# Patient Record
Sex: Male | Born: 1975
Health system: Southern US, Community
[De-identification: ages and names within clinical notes are randomized; demographics above are authoritative.]

## PROBLEM LIST (undated history)

## (undated) DIAGNOSIS — R7303 Prediabetes: Secondary | ICD-10-CM

## (undated) DIAGNOSIS — I10 Essential (primary) hypertension: Secondary | ICD-10-CM

## (undated) DIAGNOSIS — M48062 Spinal stenosis, lumbar region with neurogenic claudication: Principal | ICD-10-CM

## (undated) DIAGNOSIS — J45909 Unspecified asthma, uncomplicated: Secondary | ICD-10-CM

## (undated) DIAGNOSIS — N200 Calculus of kidney: Secondary | ICD-10-CM

## (undated) HISTORY — DX: Unspecified asthma, uncomplicated: J45.909

## (undated) HISTORY — PX: BACK SURGERY: SHX140

## (undated) HISTORY — PX: OTHER SURGICAL HISTORY: SHX169

## (undated) HISTORY — DX: Spinal stenosis, lumbar region with neurogenic claudication: M48.062

---

## 2010-03-05 ENCOUNTER — Emergency Department (HOSPITAL_COMMUNITY): Admission: EM | Admit: 2010-03-05 | Discharge: 2010-03-05 | Payer: Self-pay | Admitting: Family Medicine

## 2010-07-09 LAB — POCT URINALYSIS DIPSTICK
Nitrite: NEGATIVE
Protein, ur: 30 mg/dL — AB
Urobilinogen, UA: 1 mg/dL (ref 0.0–1.0)
pH: 5.5 (ref 5.0–8.0)

## 2010-07-09 LAB — URINE CULTURE

## 2010-07-09 LAB — POCT I-STAT, CHEM 8
BUN: 12 mg/dL (ref 6–23)
Chloride: 101 mEq/L (ref 96–112)
Sodium: 140 mEq/L (ref 135–145)
TCO2: 31 mmol/L (ref 0–100)

## 2010-07-26 ENCOUNTER — Inpatient Hospital Stay (INDEPENDENT_AMBULATORY_CARE_PROVIDER_SITE_OTHER)
Admission: RE | Admit: 2010-07-26 | Discharge: 2010-07-26 | Disposition: A | Discharge status: Home / Self Care | Payer: 59 | Source: Ambulatory Visit | Attending: Family Medicine | Admitting: Family Medicine

## 2010-07-26 DIAGNOSIS — R109 Unspecified abdominal pain: Secondary | ICD-10-CM

## 2010-07-26 LAB — POCT URINALYSIS DIP (DEVICE)
Bilirubin Urine: NEGATIVE
Glucose, UA: NEGATIVE mg/dL
Hgb urine dipstick: NEGATIVE
Nitrite: NEGATIVE
Specific Gravity, Urine: 1.02 (ref 1.005–1.030)

## 2010-10-17 ENCOUNTER — Inpatient Hospital Stay (INDEPENDENT_AMBULATORY_CARE_PROVIDER_SITE_OTHER)
Admission: RE | Admit: 2010-10-17 | Discharge: 2010-10-17 | Disposition: A | Discharge status: Home / Self Care | Payer: 59 | Source: Ambulatory Visit | Attending: Emergency Medicine | Admitting: Emergency Medicine

## 2010-10-17 DIAGNOSIS — H109 Unspecified conjunctivitis: Secondary | ICD-10-CM

## 2010-12-18 ENCOUNTER — Encounter: Payer: Self-pay | Admitting: Family Medicine

## 2010-12-18 ENCOUNTER — Ambulatory Visit (INDEPENDENT_AMBULATORY_CARE_PROVIDER_SITE_OTHER): Payer: 59 | Admitting: Family Medicine

## 2010-12-18 DIAGNOSIS — Z720 Tobacco use: Secondary | ICD-10-CM

## 2010-12-18 DIAGNOSIS — E669 Obesity, unspecified: Secondary | ICD-10-CM

## 2010-12-18 DIAGNOSIS — I10 Essential (primary) hypertension: Secondary | ICD-10-CM

## 2010-12-18 DIAGNOSIS — F17200 Nicotine dependence, unspecified, uncomplicated: Secondary | ICD-10-CM

## 2010-12-18 DIAGNOSIS — A6000 Herpesviral infection of urogenital system, unspecified: Secondary | ICD-10-CM

## 2010-12-18 MED ORDER — LOSARTAN POTASSIUM 50 MG PO TABS: 50.0000 mg | ORAL_TABLET | Freq: Every day | ORAL | Status: DC

## 2010-12-18 MED ORDER — HYDROCHLOROTHIAZIDE 25 MG PO TABS
25.0000 mg | ORAL_TABLET | Freq: Every day | ORAL | Status: DC
Start: 2010-12-18 — End: 2012-01-02

## 2010-12-18 MED ORDER — VALACYCLOVIR HCL 1 G PO TABS: 1000.0000 mg | ORAL_TABLET | Freq: Two times a day (BID) | ORAL | Status: AC

## 2010-12-18 NOTE — Assessment & Plan Note (Signed)
Poorly controlled.  Discussed importance of diet and exercise

## 2010-12-18 NOTE — Assessment & Plan Note (Signed)
Poorly controlled due to no current medications.  Will restart his previous medications and monitor his blood pressure

## 2010-12-18 NOTE — Assessment & Plan Note (Signed)
Smokes 2 per day.  Discussed stopping entirely

## 2010-12-18 NOTE — Patient Instructions (Addendum)
Take your blood pressure tablets every day Measure your blood pressure at work every other day and Write down the readings  Consider stopping smoking completely Increase exercise to 20 minutes 3-5 times a week  Losing weight is a good goal - Aim for 2 lbs a week  You need blood tests and a recheck for your blood pressure in one month   We will refer you to sports Medicine for orthotics

## 2010-12-18 NOTE — Progress Notes (Signed)
  Subjective:    Patient ID: William Franco, male    DOB: August 26, 1975, 35 y.o.   MRN: [id number]  HPI  HYPERTENSION Disease Monitoring Home BP Monitoring not doing Chest pain- no     Dyspnea-  no  Medications Compliance: not currently on medications for this problem. Lightheadedness-  no  Edema-  no  Has had hypertension for years but has been off medications - which I restarted today for a least a month  ROS - See HPI  PMH Cardiovascular risk factors: hypertension Lab Review   Potassium  Date Value Range Status  03/05/2010 3.4* 3.5-5.1 (mEq/L) Final     Sodium  Date Value Range Status  03/05/2010 140  135-145 (mEq/L) Final      OBESITY  Current weight/BMI :  357  How long have been obese:  years Course:  worsening Problems or symptoms it causes:  High blood pressure, pain in feet   Things have tried to improve:  exercise   Review of Systems\ See above      Objective:   Physical Exam Heart - Regular rate and rhythm.  No murmurs, gallops or rubs.    Lungs:  Normal respiratory effort, chest expands symmetrically. Lungs are clear to auscultation, no crackles or wheezes. Abdomen: soft and non-tender without masses, organomegaly or hernias noted.  No guarding or rebound Obese Extremities:  No cyanosis, edema, or deformity noted with good range of motion of all major joints.   Skin:  Intact without suspicious lesions or rashes Neck:  No deformities, thyromegaly, masses, or tenderness noted.   Supple with full range of motion without pain.        Assessment & Plan:

## 2010-12-24 NOTE — Progress Notes (Signed)
Scheduled co visit for 9/24.

## 2011-01-20 ENCOUNTER — Ambulatory Visit: Payer: 59 | Admitting: Home Health Services

## 2011-02-10 ENCOUNTER — Ambulatory Visit: Payer: 59 | Admitting: Home Health Services

## 2011-12-25 ENCOUNTER — Other Ambulatory Visit: Payer: Self-pay | Admitting: Family Medicine

## 2012-01-02 ENCOUNTER — Other Ambulatory Visit: Payer: Self-pay | Admitting: Family Medicine

## 2012-01-08 ENCOUNTER — Other Ambulatory Visit: Payer: Self-pay | Admitting: Family Medicine

## 2012-03-05 ENCOUNTER — Other Ambulatory Visit: Payer: Self-pay | Admitting: Family Medicine

## 2012-03-15 ENCOUNTER — Other Ambulatory Visit: Payer: Self-pay | Admitting: Family Medicine

## 2012-03-15 ENCOUNTER — Telehealth: Payer: Self-pay | Admitting: Family Medicine

## 2012-03-15 MED ORDER — HYDROCHLOROTHIAZIDE 25 MG PO TABS: 25.0000 mg | ORAL_TABLET | Freq: Every day | ORAL | Status: DC

## 2012-03-15 MED ORDER — LOSARTAN POTASSIUM 50 MG PO TABS
50.0000 mg | ORAL_TABLET | Freq: Every day | ORAL | Status: DC
Start: 2012-03-15 — End: 2012-04-19

## 2012-03-15 NOTE — Telephone Encounter (Signed)
Done

## 2012-03-15 NOTE — Telephone Encounter (Signed)
Pt is asking for his HCTZ & Losartan - has made an appt on 11/25 - needs enough until then   [facility]

## 2012-03-22 ENCOUNTER — Encounter: Payer: 59 | Admitting: Family Medicine

## 2012-04-12 ENCOUNTER — Encounter: Payer: 59 | Admitting: Family Medicine

## 2012-04-19 ENCOUNTER — Ambulatory Visit (INDEPENDENT_AMBULATORY_CARE_PROVIDER_SITE_OTHER): Payer: 59 | Admitting: Family Medicine

## 2012-04-19 ENCOUNTER — Encounter: Payer: Self-pay | Admitting: Family Medicine

## 2012-04-19 VITALS — BP 158/108 | HR 79 | Ht >= 80 in | Wt 356.5 lb

## 2012-04-19 DIAGNOSIS — N529 Male erectile dysfunction, unspecified: Secondary | ICD-10-CM | POA: Insufficient documentation

## 2012-04-19 DIAGNOSIS — R350 Frequency of micturition: Secondary | ICD-10-CM

## 2012-04-19 DIAGNOSIS — R358 Other polyuria: Secondary | ICD-10-CM

## 2012-04-19 DIAGNOSIS — I10 Essential (primary) hypertension: Secondary | ICD-10-CM

## 2012-04-19 DIAGNOSIS — R3589 Other polyuria: Secondary | ICD-10-CM

## 2012-04-19 LAB — COMPREHENSIVE METABOLIC PANEL
ALT: 65 U/L — ABNORMAL HIGH (ref 0–53)
Albumin: 4.2 g/dL (ref 3.5–5.2)
Alkaline Phosphatase: 42 U/L (ref 39–117)
Glucose, Bld: 86 mg/dL (ref 70–99)
Potassium: 4 mEq/L (ref 3.5–5.3)
Sodium: 141 mEq/L (ref 135–145)
Total Protein: 7.2 g/dL (ref 6.0–8.3)

## 2012-04-19 MED ORDER — SILDENAFIL CITRATE 100 MG PO TABS: 100.0000 mg | ORAL_TABLET | ORAL | Status: DC | PRN

## 2012-04-19 MED ORDER — HYDROCHLOROTHIAZIDE 25 MG PO TABS: 25.0000 mg | ORAL_TABLET | Freq: Every day | ORAL | Status: DC

## 2012-04-19 MED ORDER — LOSARTAN POTASSIUM 50 MG PO TABS: 50.0000 mg | ORAL_TABLET | Freq: Every day | ORAL | Status: DC

## 2012-04-19 NOTE — Assessment & Plan Note (Signed)
Acute.  No signs of diabetes or UTI.  Maybe related to his eating late at night.  Usually only rises once during night.  Advised to change timing of meals and consider weight loss

## 2012-04-19 NOTE — Progress Notes (Signed)
  Subjective:    Patient ID: William Franco, male    DOB: 04-11-1976, 36 y.o.   MRN: [id number]  HPI  HYPERTENSION Disease Monitoring Home BP Monitoring checks very infrequently last blood pressure was in 150s/90s Chest pain- no    Dyspnea- no Medications Compliance-  did not take today.  MIsses about 1 dose per week Lightheadedness-  no  Edema- no   PMH Lab Review   Potassium  Date Value Range Status  03/05/2010 3.4* 3.5 - 5.1 mEq/L Final     Sodium  Date Value Range Status  03/05/2010 140  135 - 145 mEq/L Final     Creatinine, Ser  Date Value Range Status  03/05/2010 1.3  0.4 - 1.5 mg/dL Final       Polyuria Goes several times a night to urinate for the last few months.  No pain or burning.  Is drinking more liquids.   No Family history  of diabetes   Mass on left thigh Has been present for years.  Does not bother him Is not changing in size.  Just wonders what it is    Review of Systems  ROS - See HPI     Objective:   Physical Exam  Heart - Regular rate and rhythm.  No murmurs, gallops or rubs.    Lungs:  Normal respiratory effort, chest expands symmetrically. Lungs are clear to auscultation, no crackles or wheezes. Extremities:  No cyanosis, edema, or deformity noted with good range of motion of all major joints.   Has an area of varicose veins about 10 x 10 cm on posterior lower medial left thigh.  Nontender soft decreases in size with being supine increase with standing Neck:  No deformities, thyromegaly, masses, or tenderness noted.   Supple with full range of motion without pain. Skin:  Intact without suspicious lesions or rashes         Assessment & Plan:

## 2012-04-19 NOTE — Patient Instructions (Addendum)
Come back in 1 month.  Try to check your blood pressure several times a week and write down the readings and bring in   I will call you if your lab tests are not normal.  Otherwise we will discuss them at your next visit.  Work on stop smoking - consider a substitute   [facility] [address]

## 2012-04-19 NOTE — Assessment & Plan Note (Signed)
Poorly controlled possibly due to not taking medication today but likely will need and increased dose.  Check labs and monitor

## 2012-04-19 NOTE — Assessment & Plan Note (Signed)
Has responded very well in past to viagra.  Will prescribe

## 2012-05-17 ENCOUNTER — Ambulatory Visit: Payer: 59 | Admitting: Family Medicine

## 2012-05-31 ENCOUNTER — Ambulatory Visit: Payer: 59 | Admitting: Family Medicine

## 2012-06-14 ENCOUNTER — Other Ambulatory Visit: Payer: Self-pay | Admitting: *Deleted

## 2012-06-14 DIAGNOSIS — I10 Essential (primary) hypertension: Secondary | ICD-10-CM

## 2012-06-14 MED ORDER — LOSARTAN POTASSIUM 50 MG PO TABS: 50.0000 mg | ORAL_TABLET | Freq: Every day | ORAL | Status: DC

## 2012-06-14 MED ORDER — HYDROCHLOROTHIAZIDE 25 MG PO TABS: 25.0000 mg | ORAL_TABLET | Freq: Every day | ORAL | Status: DC

## 2012-06-19 ENCOUNTER — Other Ambulatory Visit: Payer: Self-pay | Admitting: Family Medicine

## 2012-07-19 ENCOUNTER — Telehealth: Payer: Self-pay | Admitting: Family Medicine

## 2012-07-19 DIAGNOSIS — I10 Essential (primary) hypertension: Secondary | ICD-10-CM

## 2012-07-19 MED ORDER — HYDROCHLOROTHIAZIDE 25 MG PO TABS: 25.0000 mg | ORAL_TABLET | Freq: Every day | ORAL | Status: DC

## 2012-07-19 MED ORDER — LOSARTAN POTASSIUM 50 MG PO TABS: 50.0000 mg | ORAL_TABLET | Freq: Every day | ORAL | Status: DC

## 2012-07-19 NOTE — Telephone Encounter (Signed)
Pt has appt for 4/16 and needs enough Losartin and HCTZ called in until appt  [facility]

## 2012-08-11 ENCOUNTER — Encounter: Payer: Self-pay | Admitting: Family Medicine

## 2012-08-11 ENCOUNTER — Ambulatory Visit (INDEPENDENT_AMBULATORY_CARE_PROVIDER_SITE_OTHER): Payer: 59 | Admitting: Family Medicine

## 2012-08-11 VITALS — BP 158/94 | HR 77 | Ht 79.0 in | Wt 351.0 lb

## 2012-08-11 DIAGNOSIS — F17200 Nicotine dependence, unspecified, uncomplicated: Secondary | ICD-10-CM

## 2012-08-11 DIAGNOSIS — Z720 Tobacco use: Secondary | ICD-10-CM

## 2012-08-11 DIAGNOSIS — Z13220 Encounter for screening for lipoid disorders: Secondary | ICD-10-CM

## 2012-08-11 DIAGNOSIS — I10 Essential (primary) hypertension: Secondary | ICD-10-CM

## 2012-08-11 MED ORDER — HYDROCHLOROTHIAZIDE 25 MG PO TABS: 25.0000 mg | ORAL_TABLET | Freq: Every day | ORAL | Status: DC

## 2012-08-11 MED ORDER — LOSARTAN POTASSIUM 100 MG PO TABS: 100.0000 mg | ORAL_TABLET | Freq: Every day | ORAL | Status: DC

## 2012-08-11 NOTE — Patient Instructions (Addendum)
Increase losartan to 100 mg every day  Come in for a blood test to make sure your kidney and liver are doing well in 1-2 weeks - This is very important Come in fasting no food for 6 hours before hand  Stopping smoking would be the best thing you can do for your health   I will call you if your tests are not good.  Otherwise I will send you a letter.  If you do not hear from me with in 2 weeks please call our office.     Your blood pressure goal is < 140/90.   Check it every week or so.  If regularly > than 140/90 then contact me  Come back in 3-6 months

## 2012-08-11 NOTE — Assessment & Plan Note (Signed)
He feels he can quit completely Encouraged and discussed substitutes

## 2012-08-11 NOTE — Assessment & Plan Note (Signed)
Not at goal.  Will increase Losartan.  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

## 2012-08-11 NOTE — Progress Notes (Signed)
  Subjective:    Patient ID: William Franco, male    DOB: [birth year], 37 y.o.   MRN: [id number]  HPI   HYPERTENSION Disease Monitoring Home BP Monitoring has not been checking Medications Compliance-  Took his medications this morning.  MIsses about 1 dose per week Lightheadedness-  no  Edema- no  Smoking Has not smoked for 1 week.  Usually smokes 2-3 per day.  Feels he can quit without assistance   Review of Systems     Objective:   Physical Exam   Heart - Regular rate and rhythm.  No murmurs, gallops or rubs.    Lungs:  Normal respiratory effort, chest expands symmetrically. Lungs are clear to auscultation, no crackles or wheezes. Extremities:  No cyanosis, edema, or deformity noted with good range of motion of all major joints.        Assessment & Plan:

## 2013-03-30 ENCOUNTER — Other Ambulatory Visit: Payer: Self-pay | Admitting: Family Medicine

## 2013-03-30 DIAGNOSIS — I10 Essential (primary) hypertension: Secondary | ICD-10-CM

## 2013-03-30 MED ORDER — LOSARTAN POTASSIUM 100 MG PO TABS: 100.0000 mg | ORAL_TABLET | Freq: Every day | ORAL | Status: DC

## 2013-07-25 ENCOUNTER — Ambulatory Visit: Payer: 59 | Admitting: Family Medicine

## 2013-08-05 ENCOUNTER — Encounter: Payer: Self-pay | Admitting: Family Medicine

## 2013-08-05 ENCOUNTER — Ambulatory Visit (INDEPENDENT_AMBULATORY_CARE_PROVIDER_SITE_OTHER): Payer: 59 | Admitting: Family Medicine

## 2013-08-05 VITALS — BP 172/106 | HR 81 | Temp 98.3°F | Wt 356.3 lb

## 2013-08-05 DIAGNOSIS — I10 Essential (primary) hypertension: Secondary | ICD-10-CM

## 2013-08-05 LAB — LIPID PANEL
CHOLESTEROL: 168 mg/dL (ref 0–200)
HDL: 34 mg/dL — ABNORMAL LOW (ref >39)
LDL Cholesterol: 118 mg/dL — ABNORMAL HIGH (ref 0–99)
TRIGLYCERIDES: 80 mg/dL (ref <150)
Total CHOL/HDL Ratio: 4.9 Ratio
VLDL: 16 mg/dL (ref 0–40)

## 2013-08-05 LAB — COMPREHENSIVE METABOLIC PANEL
ALBUMIN: 4 g/dL (ref 3.5–5.2)
ALT: 61 U/L — AB (ref 0–53)
AST: 30 U/L (ref 0–37)
Alkaline Phosphatase: 38 U/L — ABNORMAL LOW (ref 39–117)
BUN: 12 mg/dL (ref 6–23)
CALCIUM: 9 mg/dL (ref 8.4–10.5)
CHLORIDE: 105 meq/L (ref 96–112)
CO2: 27 meq/L (ref 19–32)
Creat: 0.95 mg/dL (ref 0.50–1.35)
GLUCOSE: 101 mg/dL — AB (ref 70–99)
Potassium: 4 mEq/L (ref 3.5–5.3)
SODIUM: 141 meq/L (ref 135–145)
TOTAL PROTEIN: 7 g/dL (ref 6.0–8.3)
Total Bilirubin: 0.4 mg/dL (ref 0.2–1.2)

## 2013-08-05 MED ORDER — LOSARTAN POTASSIUM-HCTZ 100-25 MG PO TABS: 1.0000 | ORAL_TABLET | Freq: Every day | ORAL | Status: DC

## 2013-08-05 NOTE — Assessment & Plan Note (Signed)
A: BP not at goal. Not currently on medication. No symptoms or red flags.  P: - Restart medication. Given Losartan and HCTZ in combo pill - Will check BP at work within the next month - Cmet and Lipid panel today - F/u in 6 months or sooner if needed.

## 2013-08-05 NOTE — Patient Instructions (Signed)
We will check your labs today. I will call you with anything abnormal.  Please start taking your medications again. I have prescribed them in a combo pill so you only have to take one pill daily.  Recheck your blood pressure in the PACU in about a month. Let me know if it is still high. We will see you back in 6 months.  Isael Stille M. Berania Peedin, M.D.

## 2013-08-05 NOTE — Progress Notes (Signed)
Patient ID: William Franco, male   DOB: 01-05-1976, 38 y.o.   MRN: [id number]    Subjective: HPI: Patient is a 38 y.o. male presenting to clinic today for follow up on HTN. Concerns today include medication refills  1. Hypertension Blood pressure at home: Does not check Blood pressure today: 172/106 at triage Taking Meds: No, has been out of medications for at least 2 weeks Side effects: None ROS: Denies headache, visual changes, nausea, vomiting, chest pain, abdominal pain or shortness of breath.   History Reviewed: Daily smoker - Thinking about quitting Health Maintenance: UTD  ROS: Please see HPI above.  Objective: Office vital signs reviewed. BP 172/106  Pulse 81  Temp(Src) 98.3 F (36.8 C) (Oral)  Wt 356 lb 4.8 oz (161.617 kg)  Physical Examination:  General: Awake, alert. NAD.  HEENT: Atraumatic, normocephalic. MMM Pulm: CTAB, no wheezes Cardio: RRR, no murmurs appreciated Abdomen:+BS, soft, nontender, nondistended Extremities: No edema Neuro: Strength and sensation grossly intact  Assessment: 38 y.o. male follow up HTN  Plan: See Problem List and After Visit Summary

## 2013-08-15 ENCOUNTER — Encounter: Payer: Self-pay | Admitting: Family Medicine

## 2013-09-30 ENCOUNTER — Emergency Department (HOSPITAL_COMMUNITY)
Admission: EM | Admit: 2013-09-30 | Discharge: 2013-09-30 | Disposition: A | Discharge status: Home / Self Care | Payer: 59 | Source: Home / Self Care

## 2013-09-30 ENCOUNTER — Encounter (HOSPITAL_COMMUNITY): Payer: Self-pay | Admitting: Emergency Medicine

## 2013-09-30 DIAGNOSIS — J029 Acute pharyngitis, unspecified: Secondary | ICD-10-CM

## 2013-09-30 LAB — POCT RAPID STREP A: Streptococcus, Group A Screen (Direct): NEGATIVE

## 2013-09-30 MED ORDER — AMOXICILLIN 500 MG PO CAPS: 1000.0000 mg | ORAL_CAPSULE | Freq: Two times a day (BID) | ORAL | Status: DC

## 2013-09-30 NOTE — ED Provider Notes (Signed)
CSN: [id number]     Arrival date & time 09/30/13  0813 History   First MD Initiated Contact with Patient 09/30/13 [phone]     No chief complaint on file.  (Consider location/radiation/quality/duration/timing/severity/associated sxs/prior Treatment) HPI Comments: Sore throat  X 4 d. Minor PND and no fever. Works in [place].   No past medical history on file. Past Surgical History  Procedure Laterality Date  . None     Family History  Problem Relation Age of Onset  . Hypertension Father   . Hypertension Mother    History  Substance Use Topics  . Smoking status: Current Every Day Smoker -- 0.10 packs/day  . Smokeless tobacco: Not on file  . Alcohol Use: No    Review of Systems  Constitutional: Positive for activity change and fatigue. Negative for fever.  HENT: Positive for postnasal drip and sore throat. Negative for congestion and rhinorrhea.   Eyes: Negative.   Respiratory: Negative.   Cardiovascular: Negative.   Gastrointestinal: Negative.     Allergies  Review of patient's allergies indicates no known allergies.  Home Medications   Prior to Admission medications   Medication Sig Start Date End Date Taking? Authorizing Provider  amoxicillin (AMOXIL) 500 MG capsule Take 2 capsules (1,000 mg total) by mouth 2 (two) times daily. 09/30/13   Janne Napoleon, NP  losartan-hydrochlorothiazide (HYZAAR) 100-25 MG per tablet Take 1 tablet by mouth daily. 08/05/13   Amber Fidel Levy, MD  sildenafil (VIAGRA) 100 MG tablet Take 1 tablet (100 mg total) by mouth as needed for erectile dysfunction. 04/19/12 04/19/13  Lind Covert, MD   BP 161/99  Pulse 70  Temp(Src) 98.4 F (36.9 C) (Oral)  Resp 18  SpO2 99% Physical Exam  Nursing note and vitals reviewed. Constitutional: He is oriented to person, place, and time. He appears well-developed and well-nourished. No distress.  HENT:  Right Ear: External ear normal.  Left Ear: External ear normal.  Beefy red OP and tonsils with  exudates. No edema or signs of abscess.  Eyes: Conjunctivae and EOM are normal.  Neck: Normal range of motion. Neck supple.  Cardiovascular: Normal rate.   Pulmonary/Chest: Effort normal.  Faint end expiratory wheeze  Lymphadenopathy:    He has no cervical adenopathy.  Neurological: He is alert and oriented to person, place, and time. He exhibits normal muscle tone.  Skin: Skin is warm and dry.  Psychiatric: He has a normal mood and affect.    ED Course  Procedures (including critical care time) Labs Review Labs Reviewed  POCT RAPID STREP A ([facility] URG CARE ONLY)    Imaging Review No results found.   MDM   1. Exudative pharyngitis     Amoxicillin as dir Cepacol loz, ibuprofen 800 mg  q 8h prn No work today. Lots of fluids     Janne Napoleon, NP 09/30/13 0900

## 2013-09-30 NOTE — Discharge Instructions (Signed)
Pharyngitis Pharyngitis is redness, pain, and swelling (inflammation) of your pharynx.  CAUSES  Pharyngitis is usually caused by infection. Most of the time, these infections are from viruses (viral) and are part of a cold. However, sometimes pharyngitis is caused by bacteria (bacterial). Pharyngitis can also be caused by allergies. Viral pharyngitis may be spread from person to person by coughing, sneezing, and personal items or utensils (cups, forks, spoons, toothbrushes). Bacterial pharyngitis may be spread from person to person by more intimate contact, such as kissing.  SIGNS AND SYMPTOMS  Symptoms of pharyngitis include:   Sore throat.   Tiredness (fatigue).   Low-grade fever.   Headache.  Joint pain and muscle aches.  Skin rashes.  Swollen lymph nodes.  Plaque-like film on throat or tonsils (often seen with bacterial pharyngitis). DIAGNOSIS  Your health care provider will ask you questions about your illness and your symptoms. Your medical history, along with a physical exam, is often all that is needed to diagnose pharyngitis. Sometimes, a rapid strep test is done. Other lab tests may also be done, depending on the suspected cause.  TREATMENT  Viral pharyngitis will usually get better in 3 4 days without the use of medicine. Bacterial pharyngitis is treated with medicines that kill germs (antibiotics).  HOME CARE INSTRUCTIONS   Drink enough water and fluids to keep your urine clear or pale yellow.   Only take over-the-counter or prescription medicines as directed by your health care provider:   If you are prescribed antibiotics, make sure you finish them even if you start to feel better.   Do not take aspirin.   Get lots of rest.   Gargle with 8 oz of salt water ( tsp of salt per 1 qt of water) as often as every 1 2 hours to soothe your throat.   Throat lozenges (if you are not at risk for choking) or sprays may be used to soothe your throat. SEEK MEDICAL  CARE IF:   You have large, tender lumps in your neck.  You have a rash.  You cough up green, yellow-brown, or bloody spit. SEEK IMMEDIATE MEDICAL CARE IF:   Your neck becomes stiff.  You drool or are unable to swallow liquids.  You vomit or are unable to keep medicines or liquids down.  You have severe pain that does not go away with the use of recommended medicines.  You have trouble breathing (not caused by a stuffy nose). MAKE SURE YOU:   Understand these instructions.  Will watch your condition.  Will get help right away if you are not doing well or get worse. Document Released: 04/14/2005 Document Revised: 02/02/2013 Document Reviewed: 12/20/2012 [facility] Patient Information 2014 [place].  Sore Throat A sore throat is pain, burning, irritation, or scratchiness of the throat. There is often pain or tenderness when swallowing or talking. A sore throat may be accompanied by other symptoms, such as coughing, sneezing, fever, and swollen neck glands. A sore throat is often the first sign of another sickness, such as a cold, flu, strep throat, or mononucleosis (commonly known as mono). Most sore throats go away without medical treatment. CAUSES  The most common causes of a sore throat include:  A viral infection, such as a cold, flu, or mono.  A bacterial infection, such as strep throat, tonsillitis, or whooping cough.  Seasonal allergies.  Dryness in the air.  Irritants, such as smoke or pollution.  Gastroesophageal reflux disease (GERD). HOME CARE INSTRUCTIONS   Only take over-the-counter  medicines as directed by your caregiver.  Drink enough fluids to keep your urine clear or pale yellow.  Rest as needed.  Try using throat sprays, lozenges, or sucking on hard candy to ease any pain (if older than 4 years or as directed).  Sip warm liquids, such as broth, herbal tea, or warm water with honey to relieve pain temporarily. You may also eat or drink cold or  frozen liquids such as frozen ice pops.  Gargle with salt water (mix 1 tsp salt with 8 oz of water).  Do not smoke and avoid secondhand smoke.  Put a cool-mist humidifier in your bedroom at night to moisten the air. You can also turn on a hot shower and sit in the bathroom with the door closed for 5 10 minutes. SEEK IMMEDIATE MEDICAL CARE IF:  You have difficulty breathing.  You are unable to swallow fluids, soft foods, or your saliva.  You have increased swelling in the throat.  Your sore throat does not get better in 7 days.  You have nausea and vomiting.  You have a fever or persistent symptoms for more than 2 3 days.  You have a fever and your symptoms suddenly get worse. MAKE SURE YOU:   Understand these instructions.  Will watch your condition.  Will get help right away if you are not doing well or get worse. Document Released: 05/22/2004 Document Revised: 03/31/2012 Document Reviewed: 12/21/2011 [facility] Patient Information 2014 [place], [place].

## 2013-09-30 NOTE — ED Provider Notes (Signed)
Medical screening examination/treatment/procedure(s) were performed by resident physician or non-physician practitioner and as supervising physician I was immediately available for consultation/collaboration.   Pauline Good MD.   Billy Fischer, MD 09/30/13 1150

## 2013-09-30 NOTE — ED Notes (Signed)
C/o sore throat since Monday ( 6/1)

## 2013-10-02 LAB — CULTURE, GROUP A STREP

## 2013-11-24 ENCOUNTER — Other Ambulatory Visit: Payer: Self-pay | Admitting: Family Medicine

## 2013-11-28 ENCOUNTER — Other Ambulatory Visit: Payer: Self-pay | Admitting: *Deleted

## 2013-11-28 MED ORDER — SILDENAFIL CITRATE 100 MG PO TABS: 100.0000 mg | ORAL_TABLET | ORAL | Status: DC | PRN

## 2014-03-07 ENCOUNTER — Other Ambulatory Visit: Payer: Self-pay | Admitting: *Deleted

## 2014-03-07 MED ORDER — LOSARTAN POTASSIUM 100 MG PO TABS: 100.0000 mg | ORAL_TABLET | Freq: Every day | ORAL | Status: DC

## 2014-03-27 ENCOUNTER — Encounter: Payer: Self-pay | Admitting: Family Medicine

## 2014-03-27 ENCOUNTER — Ambulatory Visit (INDEPENDENT_AMBULATORY_CARE_PROVIDER_SITE_OTHER): Payer: 59 | Admitting: Family Medicine

## 2014-03-27 VITALS — BP 165/99 | HR 82 | Temp 98.5°F | Ht >= 80 in | Wt 364.0 lb

## 2014-03-27 DIAGNOSIS — E669 Obesity, unspecified: Secondary | ICD-10-CM

## 2014-03-27 DIAGNOSIS — Z720 Tobacco use: Secondary | ICD-10-CM

## 2014-03-27 DIAGNOSIS — I10 Essential (primary) hypertension: Secondary | ICD-10-CM

## 2014-03-27 MED ORDER — HYDROCHLOROTHIAZIDE 25 MG PO TABS: 25.0000 mg | ORAL_TABLET | Freq: Every day | ORAL | Status: DC

## 2014-03-27 MED ORDER — LOSARTAN POTASSIUM 100 MG PO TABS: 100.0000 mg | ORAL_TABLET | Freq: Every day | ORAL | Status: DC

## 2014-03-27 MED ORDER — VALACYCLOVIR HCL 500 MG PO TABS: 500.0000 mg | ORAL_TABLET | Freq: Two times a day (BID) | ORAL | Status: DC

## 2014-03-27 NOTE — Assessment & Plan Note (Signed)
Recommended stopping

## 2014-03-27 NOTE — Assessment & Plan Note (Signed)
Not controlled likely due to being out of medication Suggested monitoring in PACU near where he works - he again says he will Went over goals of < 140/90

## 2014-03-27 NOTE — Progress Notes (Signed)
   Subjective:    Patient ID: William Franco, male    DOB: 07/31/1975, 38 y.o.   MRN: [id number]  HPI  HYPERTENSION Disease Monitoring Home BP Monitoring not checking at work Chest pain- no    Dyspnea- no Medications Compliance-  Out of losartan for 3-4 days. Lightheadedness-  no  Edema- no  Smoking Still 1 cig per day when off work  Obesity Not exercising and does drink sweet drinks. 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   Vital Signs reviewed  ROS - See HPI  PMH Lab Review   POTASSIUM  Date Value Ref Range Status  08/05/2013 4.0 3.5 - 5.3 mEq/L Final   SODIUM  Date Value Ref Range Status  08/05/2013 141 135 - 145 mEq/L Final   CREAT  Date Value Ref Range Status  08/05/2013 0.95 0.50 - 1.35 mg/dL Final   CREATININE, SER  Date Value Ref Range Status  03/05/2010 1.3 0.4 - 1.5 mg/dL Final           Review of Systems     Objective:   Physical Exam Alert no acute distress Heart - Regular rate and rhythm.  No murmurs, gallops or rubs.    Lungs:  Normal respiratory effort, chest expands symmetrically. Lungs are clear to auscultation, no crackles or wheezes. Extremities:  No cyanosis, edema, or deformity noted with good range of motion of all major joints.          Assessment & Plan:

## 2014-03-27 NOTE — Assessment & Plan Note (Signed)
Worsened.  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  Voices good understanding of causes and solutions.  Offered counseling if desired

## 2014-03-27 NOTE — Patient Instructions (Signed)
Good to see you today!  Thanks for coming in.  Listen to yourself  Exercise 3 x a week  Cut down or out all sugary drinks and smaller portions.  Aim to lose about 2 lbs a week   Ask them to take your blood pressure several times after you are back on your medications for a few days.  It should be less than 140/90 If regularly above this then call me  Come back in April for a check up

## 2014-05-16 ENCOUNTER — Encounter (HOSPITAL_COMMUNITY): Payer: Self-pay | Admitting: Emergency Medicine

## 2014-05-16 ENCOUNTER — Emergency Department (HOSPITAL_COMMUNITY)
Admission: EM | Admit: 2014-05-16 | Discharge: 2014-05-16 | Disposition: A | Discharge status: Home / Self Care | Payer: 59 | Attending: Emergency Medicine | Admitting: Emergency Medicine

## 2014-05-16 ENCOUNTER — Emergency Department (HOSPITAL_COMMUNITY): Payer: 59

## 2014-05-16 DIAGNOSIS — Z79899 Other long term (current) drug therapy: Secondary | ICD-10-CM | POA: Insufficient documentation

## 2014-05-16 DIAGNOSIS — Z87442 Personal history of urinary calculi: Secondary | ICD-10-CM | POA: Diagnosis not present

## 2014-05-16 DIAGNOSIS — I10 Essential (primary) hypertension: Secondary | ICD-10-CM | POA: Insufficient documentation

## 2014-05-16 DIAGNOSIS — N3090 Cystitis, unspecified without hematuria: Secondary | ICD-10-CM

## 2014-05-16 DIAGNOSIS — R319 Hematuria, unspecified: Secondary | ICD-10-CM

## 2014-05-16 DIAGNOSIS — Z720 Tobacco use: Secondary | ICD-10-CM | POA: Diagnosis not present

## 2014-05-16 DIAGNOSIS — N3091 Cystitis, unspecified with hematuria: Secondary | ICD-10-CM | POA: Diagnosis not present

## 2014-05-16 HISTORY — DX: Calculus of kidney: N20.0

## 2014-05-16 HISTORY — DX: Essential (primary) hypertension: I10

## 2014-05-16 LAB — URINALYSIS, ROUTINE W REFLEX MICROSCOPIC
BILIRUBIN URINE: NEGATIVE
Glucose, UA: NEGATIVE mg/dL
Ketones, ur: NEGATIVE mg/dL
LEUKOCYTES UA: NEGATIVE
Nitrite: NEGATIVE
Protein, ur: NEGATIVE mg/dL
SPECIFIC GRAVITY, URINE: 1.025 (ref 1.005–1.030)
UROBILINOGEN UA: 1 mg/dL (ref 0.0–1.0)
pH: 6 (ref 5.0–8.0)

## 2014-05-16 LAB — I-STAT CHEM 8, ED
BUN: 19 mg/dL (ref 6–23)
Calcium, Ion: 1.16 mmol/L (ref 1.12–1.23)
Chloride: 100 mEq/L (ref 96–112)
Creatinine, Ser: 1.2 mg/dL (ref 0.50–1.35)
Glucose, Bld: 112 mg/dL — ABNORMAL HIGH (ref 70–99)
HEMATOCRIT: 45 % (ref 39.0–52.0)
Hemoglobin: 15.3 g/dL (ref 13.0–17.0)
POTASSIUM: 3.5 mmol/L (ref 3.5–5.1)
Sodium: 141 mmol/L (ref 135–145)
TCO2: 27 mmol/L (ref 0–100)

## 2014-05-16 LAB — URINE MICROSCOPIC-ADD ON

## 2014-05-16 MED ORDER — SULFAMETHOXAZOLE-TRIMETHOPRIM 800-160 MG PO TABS: 1.0000 | ORAL_TABLET | Freq: Two times a day (BID) | ORAL | Status: AC

## 2014-05-16 MED ORDER — PHENAZOPYRIDINE HCL 95 MG PO TABS: 95.0000 mg | ORAL_TABLET | Freq: Three times a day (TID) | ORAL | Status: DC | PRN

## 2014-05-16 NOTE — ED Provider Notes (Signed)
CSN: [id number]     Arrival date & time 05/16/14  0636 History   First MD Initiated Contact with Patient 05/16/14 [id number]     Chief Complaint  Patient presents with  . Hematuria     (Consider location/radiation/quality/duration/timing/severity/associated sxs/prior Treatment) HPI Comments: 39 y/o male with a PMHx of HTN and kidney stones presenting with hematuria x 1 day. Pt reports he urinated around 12:00 AM today and experienced a slight burning sensation in his urine stream, and 5 hours later urinated again and had a sharp, severe pain through his urethra with bright red blood in his urine stream. Currently has no urethral burning, however states he has increased urinary frequency, moreso than normal from HCTZ. Admits to hx of kidney stones, however states this pain is more intense. No alleviating factors. States he's had the same male sexual partner for a while and "doesn't need to worry about that". Has a hx of chronic back pain and has not noticed any pain out of his normal. No abdominal pain, n/v, fevers, penile pain/discharge/swelling, testicular pain or swelling.  Patient is a 39 y.o. male presenting with hematuria. The history is provided by the patient.  Hematuria    Past Medical History  Diagnosis Date  . Hypertension   . Kidney stones    Past Surgical History  Procedure Laterality Date  . None     Family History  Problem Relation Age of Onset  . Hypertension Father   . Hypertension Mother    History  Substance Use Topics  . Smoking status: Current Every Day Smoker -- 0.10 packs/day  . Smokeless tobacco: Current User  . Alcohol Use: Yes     Comment: ocassionaly    Review of Systems  Genitourinary: Positive for dysuria, frequency and hematuria.  All other systems reviewed and are negative.     Allergies  Review of patient's allergies indicates no known allergies.  Home Medications   Prior to Admission medications   Medication Sig Start Date End Date  Taking? Authorizing Provider  hydrochlorothiazide (HYDRODIURIL) 25 MG tablet Take 1 tablet (25 mg total) by mouth daily. 03/27/14  Yes Lind Covert, MD  losartan (COZAAR) 100 MG tablet Take 1 tablet (100 mg total) by mouth daily. 03/27/14  Yes Lind Covert, MD  sildenafil (VIAGRA) 100 MG tablet Take 1 tablet (100 mg total) by mouth as needed for erectile dysfunction. 11/28/13 11/28/14 Yes Lind Covert, MD  valACYclovir (VALTREX) 500 MG tablet Take 1 tablet (500 mg total) by mouth 2 (two) times daily. 03/27/14  Yes Lind Covert, MD  phenazopyridine (PYRIDIUM) 95 MG tablet Take 1 tablet (95 mg total) by mouth 3 (three) times daily as needed for pain. 05/16/14   Darchelle Nunes M Amedee Cerrone, PA-C  sulfamethoxazole-trimethoprim (BACTRIM DS,SEPTRA DS) 800-160 MG per tablet Take 1 tablet by mouth 2 (two) times daily. 05/16/14 05/23/14  Alexsis Branscom M Timea Breed, PA-C   BP 129/87 mmHg  Pulse 66  Temp(Src) 97.5 F (36.4 C) (Oral)  Resp 11  Ht 6\' 7"  (2.007 m)  Wt 360 lb (163.295 kg)  BMI 40.54 kg/m2  SpO2 99% Physical Exam  Constitutional: He is oriented to person, place, and time. He appears well-developed and well-nourished. No distress.  HENT:  Head: Normocephalic and atraumatic.  Mouth/Throat: Oropharynx is clear and moist.  Eyes: Conjunctivae are normal.  Neck: Normal range of motion. Neck supple.  Cardiovascular: Normal rate, regular rhythm and normal heart sounds.   Pulmonary/Chest: Effort normal and breath sounds normal.  Abdominal:  Soft. Bowel sounds are normal. There is no tenderness.  No CVAT.  Genitourinary: Right testis shows no swelling and no tenderness. Left testis shows no swelling and no tenderness. Uncircumcised.  Small amount of blood around urethral meatus. No penile discharge, erythema or swelling.  Musculoskeletal: Normal range of motion. He exhibits no edema.  Neurological: He is alert and oriented to person, place, and time.  Skin: Skin is warm and dry. He is not  diaphoretic.  Psychiatric: He has a normal mood and affect. His behavior is normal.  Nursing note and vitals reviewed.   ED Course  Procedures (including critical care time) Labs Review Labs Reviewed  URINALYSIS, ROUTINE W REFLEX MICROSCOPIC - Abnormal; Notable for the following:    Hgb urine dipstick LARGE (*)    All other components within normal limits  I-STAT CHEM 8, ED - Abnormal; Notable for the following:    Glucose, Bld 112 (*)    All other components within normal limits  URINE CULTURE  URINE MICROSCOPIC-ADD ON  GC/CHLAMYDIA PROBE AMP ([place])    Imaging Review Ct Abdomen Pelvis Wo Contrast  05/16/2014   CLINICAL DATA:  Hematuria and dysuria  EXAM: CT ABDOMEN AND PELVIS WITHOUT CONTRAST  TECHNIQUE: Multidetector CT imaging of the abdomen and pelvis was performed following the standard protocol without oral or intravenous contrast material administration.  COMPARISON:  August 14, 2010  FINDINGS: Lung bases are clear.  Liver is prominent, measuring 20.7 cm in length. No focal liver lesions are identified on this noncontrast enhanced study. Gallbladder wall is not thickened. There is no biliary duct dilatation.  Spleen, pancreas, and adrenals appear normal.  There is a cyst in the mid left kidney measuring 1.2 x 1.2 cm. There is no demonstrable intrarenal calculus or hydronephrosis on either side. There is no ureteral calculus on either side. There are several phleboliths in the pelvis which are near but felt to be separate from the left ureter distally.  In the pelvis, the urinary bladder wall is somewhat thickened. There is no pelvic mass or pelvic fluid collection. Appendix appears normal.  There is a minimal ventral hernia containing only fat.  There is no bowel obstruction.  No free air or portal venous air.  There is no ascites, adenopathy, or abscess in the abdomen or pelvis. There is no demonstrable abdominal aortic aneurysm. There is spinal stenosis at L3-4 and L4-5 due to  congenital narrowing as well as diffuse disc protrusion and bony hypertrophy. Borderline stenosis is noted at L2-3 due to similar factors. There is degenerative change in the lumbar spine. There are no blastic or lytic bone lesions.  IMPRESSION: Urinary bladder wall thickening.  Suspect a degree of cystitis.  Spinal stenosis at several levels, multifactorial.  No renal or ureteral calculus.  No hydronephrosis.  Prominent liver without focal lesion.  No bowel obstruction.  No abscess.  Appendix appears normal.   Electronically Signed   By: Lowella Grip M.D.   On: 05/16/2014 08:30     EKG Interpretation None      MDM   Final diagnoses:  Hematuria  Cystitis   Pt in NAD. VSS. Afebrile. Abdomen soft and non-tender. No CVAT. Other than blood around urethral meatus, GU exam normal. Initial concern for stone. UA positive for TNTC RBC. CT scan negative for stone. Urinary bladder wall thickening suspecting cystitis. UA rare bacteria, 3-6 WBC. Given sxs, will treat with abx. Urine culture and urine gc/chlamydia pending. F/u with PCP. Stable for d/c. Return precautions  given. Patient states understanding of treatment care plan and is agreeable.  Carman Ching, PA-C 05/16/14 1173  Pamella Pert, MD 05/16/14 2255

## 2014-05-16 NOTE — Discharge Instructions (Signed)
Take antibiotic Bactrim twice daily for 1 week. Take pyridium as directed for burning with urination.  Hematuria Hematuria is blood in your urine. It can be caused by a bladder infection, kidney infection, prostate infection, kidney stone, or cancer of your urinary tract. Infections can usually be treated with medicine, and a kidney stone usually will pass through your urine. If neither of these is the cause of your hematuria, further workup to find out the reason may be needed. It is very important that you tell your health care provider about any blood you see in your urine, even if the blood stops without treatment or happens without causing pain. Blood in your urine that happens and then stops and then happens again can be a symptom of a very serious condition. Also, pain is not a symptom in the initial stages of many urinary cancers. HOME CARE INSTRUCTIONS   Drink lots of fluid, 3-4 quarts a day. If you have been diagnosed with an infection, cranberry juice is especially recommended, in addition to large amounts of water.  Avoid caffeine, tea, and carbonated beverages because they tend to irritate the bladder.  Avoid alcohol because it may irritate the prostate.  Take all medicines as directed by your health care provider.  If you were prescribed an antibiotic medicine, finish it all even if you start to feel better.  If you have been diagnosed with a kidney stone, follow your health care provider's instructions regarding straining your urine to catch the stone.  Empty your bladder often. Avoid holding urine for long periods of time.  After a bowel movement, women should cleanse front to back. Use each tissue only once.  Empty your bladder before and after sexual intercourse if you are a male. SEEK MEDICAL CARE IF:  You develop back pain.  You have a fever.  You have a feeling of sickness in your stomach (nausea) or vomiting.  Your symptoms are not better in 3 days. Return  sooner if you are getting worse. SEEK IMMEDIATE MEDICAL CARE IF:   You develop severe vomiting and are unable to keep the medicine down.  You develop severe back or abdominal pain despite taking your medicines.  You begin passing a large amount of blood or clots in your urine.  You feel extremely weak or faint, or you pass out. MAKE SURE YOU:   Understand these instructions.  Will watch your condition.  Will get help right away if you are not doing well or get worse. Document Released: 04/14/2005 Document Revised: 08/29/2013 Document Reviewed: 12/13/2012 [facility] Patient Information 2015 [place], [place]. This information is not intended to replace advice given to you by your health care provider. Make sure you discuss any questions you have with your health care provider.  Urinary Tract Infection Urinary tract infections (UTIs) can develop anywhere along your urinary tract. Your urinary tract is your body's drainage system for removing wastes and extra water. Your urinary tract includes two kidneys, two ureters, a bladder, and a urethra. Your kidneys are a pair of bean-shaped organs. Each kidney is about the size of your fist. They are located below your ribs, one on each side of your spine. CAUSES Infections are caused by microbes, which are microscopic organisms, including fungi, viruses, and bacteria. These organisms are so small that they can only be seen through a microscope. Bacteria are the microbes that most commonly cause UTIs. SYMPTOMS  Symptoms of UTIs may vary by age and gender of the patient and by the location  of the infection. Symptoms in young women typically include a frequent and intense urge to urinate and a painful, burning feeling in the bladder or urethra during urination. Older women and men are more likely to be tired, shaky, and weak and have muscle aches and abdominal pain. A fever may mean the infection is in your kidneys. Other symptoms of a kidney infection  include pain in your back or sides below the ribs, nausea, and vomiting. DIAGNOSIS To diagnose a UTI, your caregiver will ask you about your symptoms. Your caregiver also will ask to provide a urine sample. The urine sample will be tested for bacteria and white blood cells. White blood cells are made by your body to help fight infection. TREATMENT  Typically, UTIs can be treated with medication. Because most UTIs are caused by a bacterial infection, they usually can be treated with the use of antibiotics. The choice of antibiotic and length of treatment depend on your symptoms and the type of bacteria causing your infection. HOME CARE INSTRUCTIONS  If you were prescribed antibiotics, take them exactly as your caregiver instructs you. Finish the medication even if you feel better after you have only taken some of the medication.  Drink enough water and fluids to keep your urine clear or pale yellow.  Avoid caffeine, tea, and carbonated beverages. They tend to irritate your bladder.  Empty your bladder often. Avoid holding urine for long periods of time.  Empty your bladder before and after sexual intercourse.  After a bowel movement, women should cleanse from front to back. Use each tissue only once. SEEK MEDICAL CARE IF:   You have back pain.  You develop a fever.  Your symptoms do not begin to resolve within 3 days. SEEK IMMEDIATE MEDICAL CARE IF:   You have severe back pain or lower abdominal pain.  You develop chills.  You have nausea or vomiting.  You have continued burning or discomfort with urination. MAKE SURE YOU:   Understand these instructions.  Will watch your condition.  Will get help right away if you are not doing well or get worse. Document Released: 01/22/2005 Document Revised: 10/14/2011 Document Reviewed: 05/23/2011 [facility] Patient Information 2015 [place], [place]. This information is not intended to replace advice given to you by your health care  provider. Make sure you discuss any questions you have with your health care provider.

## 2014-05-16 NOTE — ED Notes (Signed)
Pt states he woke up today at 5 am with a sharp pain and burning sensation when he urinated and he had bright red blood coming. Pt denies any pain or discomfort now.

## 2014-05-17 LAB — URINE CULTURE
Colony Count: NO GROWTH
Culture: NO GROWTH

## 2014-05-17 LAB — GC/CHLAMYDIA PROBE AMP (~~LOC~~) NOT AT ARMC
Chlamydia: NEGATIVE
NEISSERIA GONORRHEA: NEGATIVE

## 2014-06-29 ENCOUNTER — Encounter (HOSPITAL_COMMUNITY): Payer: Self-pay | Admitting: *Deleted

## 2014-06-29 ENCOUNTER — Emergency Department (HOSPITAL_COMMUNITY)
Admission: EM | Admit: 2014-06-29 | Discharge: 2014-06-29 | Disposition: A | Discharge status: Home / Self Care | Payer: 59 | Attending: Emergency Medicine | Admitting: Emergency Medicine

## 2014-06-29 DIAGNOSIS — M545 Low back pain, unspecified: Secondary | ICD-10-CM

## 2014-06-29 DIAGNOSIS — Z79899 Other long term (current) drug therapy: Secondary | ICD-10-CM | POA: Insufficient documentation

## 2014-06-29 DIAGNOSIS — R109 Unspecified abdominal pain: Secondary | ICD-10-CM | POA: Diagnosis present

## 2014-06-29 DIAGNOSIS — Z720 Tobacco use: Secondary | ICD-10-CM | POA: Insufficient documentation

## 2014-06-29 DIAGNOSIS — Z87442 Personal history of urinary calculi: Secondary | ICD-10-CM | POA: Insufficient documentation

## 2014-06-29 DIAGNOSIS — Z87440 Personal history of urinary (tract) infections: Secondary | ICD-10-CM | POA: Diagnosis not present

## 2014-06-29 DIAGNOSIS — I10 Essential (primary) hypertension: Secondary | ICD-10-CM | POA: Insufficient documentation

## 2014-06-29 LAB — URINALYSIS, ROUTINE W REFLEX MICROSCOPIC
Bilirubin Urine: NEGATIVE
GLUCOSE, UA: NEGATIVE mg/dL
Hgb urine dipstick: NEGATIVE
Ketones, ur: 15 mg/dL — AB
LEUKOCYTES UA: NEGATIVE
Nitrite: NEGATIVE
PH: 6 (ref 5.0–8.0)
Protein, ur: NEGATIVE mg/dL
SPECIFIC GRAVITY, URINE: 1.034 — AB (ref 1.005–1.030)
Urobilinogen, UA: 1 mg/dL (ref 0.0–1.0)

## 2014-06-29 MED ORDER — NAPROXEN 500 MG PO TABS: 500.0000 mg | ORAL_TABLET | Freq: Two times a day (BID) | ORAL | Status: DC

## 2014-06-29 MED ORDER — HYDROCODONE-ACETAMINOPHEN 5-325 MG PO TABS: 1.0000 | ORAL_TABLET | Freq: Four times a day (QID) | ORAL | Status: DC | PRN

## 2014-06-29 MED ORDER — LIDOCAINE 5 % EX PTCH
1.0000 | MEDICATED_PATCH | CUTANEOUS | Status: DC
Start: 2014-06-29 — End: 2014-10-12

## 2014-06-29 NOTE — ED Provider Notes (Signed)
CSN: [id number]     Arrival date & time 06/29/14  1646 History  This chart was scribed for non-physician practitioner, Margarita Mail PA,  working with Blanchie Dessert, MD, by Eustaquio Maize, ED Scribe. This patient was seen in room TR06C/TR06C and the patient's care was started at 5:56 PM.    Chief Complaint  Patient presents with  . Urinary Tract Infection   The history is provided by the patient. No language interpreter was used.     HPI Comments: William Franco is a 39 y.o. male who presents to the Emergency Department complaining of urinary tract infection symptoms that began approximately 4 days ago. He mainly complains of left sided flank pain. He also mentions a "gassy" feeling in his abdomen. He claims that the pain is exacerbated with twisting and bending. Pt was seen in ED 1 month ago and reports he was diagnosed with a urinary tract infection. He states that these symptoms feel similar to previous symptoms. He denies dysuria, hematuria, fever, chills, or any other symptoms. Pt had follow up appointment with primary care physician on Tuesday, 07/04/2014, but did not think he could wait that long.   05/16/2014 Urinalysis - Hemoglobin. 2-6 WBCs. No growth with urine culture.    Past Medical History  Diagnosis Date  . Hypertension   . Kidney stones    Past Surgical History  Procedure Laterality Date  . None     Family History  Problem Relation Age of Onset  . Hypertension Father   . Hypertension Mother    History  Substance Use Topics  . Smoking status: Current Every Day Smoker -- 0.10 packs/day  . Smokeless tobacco: Current User  . Alcohol Use: Yes     Comment: ocassionaly    Review of Systems  Constitutional: Negative for fever and chills.  Gastrointestinal:       Gaseous.   Genitourinary: Positive for flank pain. Negative for dysuria and hematuria.      Allergies  Review of patient's allergies indicates no known allergies.  Home Medications   Prior to  Admission medications   Medication Sig Start Date End Date Taking? Authorizing Provider  hydrochlorothiazide (HYDRODIURIL) 25 MG tablet Take 1 tablet (25 mg total) by mouth daily. 03/27/14   Lind Covert, MD  HYDROcodone-acetaminophen (NORCO) 5-325 MG per tablet Take 1 tablet by mouth every 6 (six) hours as needed. 06/29/14   Margarita Mail, PA-C  lidocaine (LIDODERM) 5 % Place 1 patch onto the skin daily. Remove & Discard patch within 12 hours or as directed by MD 06/29/14   Margarita Mail, PA-C  losartan (COZAAR) 100 MG tablet Take 1 tablet (100 mg total) by mouth daily. 03/27/14   Lind Covert, MD  naproxen (NAPROSYN) 500 MG tablet Take 1 tablet (500 mg total) by mouth 2 (two) times daily with a meal. 06/29/14   Margarita Mail, PA-C  phenazopyridine (PYRIDIUM) 95 MG tablet Take 1 tablet (95 mg total) by mouth 3 (three) times daily as needed for pain. 05/16/14   Carman Ching, PA-C  sildenafil (VIAGRA) 100 MG tablet Take 1 tablet (100 mg total) by mouth as needed for erectile dysfunction. 11/28/13 11/28/14  Lind Covert, MD  valACYclovir (VALTREX) 500 MG tablet Take 1 tablet (500 mg total) by mouth 2 (two) times daily. 03/27/14   Lind Covert, MD   Triage Vitals: BP 159/97 mmHg  Pulse 83  Temp(Src) 98.4 F (36.9 C)  Resp 18  Wt 358 lb 9 oz (162.643  kg)  SpO2 99%  Physical Exam  Constitutional: He is oriented to person, place, and time. He appears well-developed and well-nourished. No distress.  HENT:  Head: Normocephalic and atraumatic.  Eyes: Conjunctivae and EOM are normal.  Neck: Neck supple. No tracheal deviation present.  Cardiovascular: Normal rate.   Pulmonary/Chest: Effort normal. No respiratory distress.  Musculoskeletal: Normal range of motion.  Tenderness to left lower lumbar paraspinous muscles  and SI joint region, which is limited due to pain. Full strength with flexion and extension of the proximal leg muscles. Antalgic gait.  Neurological: He is  alert and oriented to person, place, and time. He has normal reflexes.  Skin: Skin is warm and dry.  Psychiatric: He has a normal mood and affect. His behavior is normal.  Nursing note and vitals reviewed.   ED Course  Procedures (including critical care time) DIAGNOSTIC STUDIES: Oxygen Saturation is 99% on RA, normal by my interpretation.    COORDINATION OF CARE: 6:01 PM-Discussed treatment plan which includes UA and follow up with PCP with pt at bedside and pt agreed to plan.   Labs Review Labs Reviewed  URINALYSIS, ROUTINE W REFLEX MICROSCOPIC - Abnormal; Notable for the following:    APPearance HAZY (*)    Specific Gravity, Urine 1.034 (*)    Ketones, ur 15 (*)    All other components within normal limits    Imaging Review No results found.   EKG Interpretation None      MDM   Final diagnoses:  Left-sided low back pain without sciatica    .BP 136/95 mmHg  Pulse 77  Temp(Src) 98.5 F (36.9 C) (Oral)  Resp 18  Wt 358 lb 9 oz (162.643 kg)  SpO2 98% Patient denies weakness, loss of bowel/bladder function or saddle anesthesia. Denies neck stiffness, headache, rash.  Denies fever or recent procedures to back. Review of his chart shows no previous urinary tract infection. His urine appears clear today. CVA tenderness. Patient with back pain.  No neurological deficits and normal neuro exam.  Patient can walk but states is painful.  No loss of bowel or bladder control.  No concern for cauda equina.  No fever, night sweats, weight loss, h/o cancer, IVDU.  RICE protocol and pain medicine indicated and discussed with patient.    I personally performed the services described in this documentation, which was scribed in my presence. The recorded information has been reviewed and is accurate.       Margarita Mail, PA-C 06/29/14 1822  Blanchie Dessert, MD 06/29/14 [phone]

## 2014-06-29 NOTE — Discharge Instructions (Signed)
SEEK IMMEDIATE MEDICAL ATTENTION IF: New numbness, tingling, weakness, or problem with the use of your arms or legs.  Severe back pain not relieved with medications.  Change in bowel or bladder control.  Increasing pain in any areas of the body (such as chest or abdominal pain).  Shortness of breath, dizziness or fainting.  Nausea (feeling sick to your stomach), vomiting, fever, or sweats.   Back Pain, Adult Low back pain is very common. About 1 in 5 people have back pain.The cause of low back pain is rarely dangerous. The pain often gets better over time.About half of people with a sudden onset of back pain feel better in just 2 weeks. About 8 in 10 people feel better by 6 weeks.  CAUSES Some common causes of back pain include:  Strain of the muscles or ligaments supporting the spine.  Wear and tear (degeneration) of the spinal discs.  Arthritis.  Direct injury to the back. DIAGNOSIS Most of the time, the direct cause of low back pain is not known.However, back pain can be treated effectively even when the exact cause of the pain is unknown.Answering your caregiver's questions about your overall health and symptoms is one of the most accurate ways to make sure the cause of your pain is not dangerous. If your caregiver needs more information, he or she may order lab work or imaging tests (X-rays or MRIs).However, even if imaging tests show changes in your back, this usually does not require surgery. HOME CARE INSTRUCTIONS For many people, back pain returns.Since low back pain is rarely dangerous, it is often a condition that people can learn to [facility] their own.   Remain active. It is stressful on the back to sit or stand in one place. Do not sit, drive, or stand in one place for more than 30 minutes at a time. Take short walks on level surfaces as soon as pain allows.Try to increase the length of time you walk each day.  Do not stay in bed.Resting more than 1 or 2 days can  delay your recovery.  Do not avoid exercise or work.Your body is made to move.It is not dangerous to be active, even though your back may hurt.Your back will likely heal faster if you return to being active before your pain is gone.  Pay attention to your body when you bend and lift. Many people have less discomfortwhen lifting if they bend their knees, keep the load close to their bodies,and avoid twisting. Often, the most comfortable positions are those that put less stress on your recovering back.  Find a comfortable position to sleep. Use a firm mattress and lie on your side with your knees slightly bent. If you lie on your back, put a pillow under your knees.  Only take over-the-counter or prescription medicines as directed by your caregiver. Over-the-counter medicines to reduce pain and inflammation are often the most helpful.Your caregiver may prescribe muscle relaxant drugs.These medicines help dull your pain so you can more quickly return to your normal activities and healthy exercise.  Put ice on the injured area.  Put ice in a plastic bag.  Place a towel between your skin and the bag.  Leave the ice on for 15-20 minutes, 03-04 times a day for the first 2 to 3 days. After that, ice and heat may be alternated to reduce pain and spasms.  Ask your caregiver about trying back exercises and gentle massage. This may be of some benefit.  Avoid feeling anxious or  stressed.Stress increases muscle tension and can worsen back pain.It is important to recognize when you are anxious or stressed and learn ways to manage it.Exercise is a great option. SEEK MEDICAL CARE IF:  You have pain that is not relieved with rest or medicine.  You have pain that does not improve in 1 week.  You have new symptoms.  You are generally not feeling well. SEEK IMMEDIATE MEDICAL CARE IF:   You have pain that radiates from your back into your legs.  You develop new bowel or bladder control  problems.  You have unusual weakness or numbness in your arms or legs.  You develop nausea or vomiting.  You develop abdominal pain.  You feel faint. Document Released: 04/14/2005 Document Revised: 10/14/2011 Document Reviewed: 08/16/2013 [facility] Patient Information 2015 [place], [place]. This information is not intended to replace advice given to you by your health care provider. Make sure you discuss any questions you have with your health care provider.

## 2014-06-29 NOTE — ED Notes (Signed)
Pt in stating he thinks he has UTI, seen recently and dx with this, took his antibiotics and symptoms resolved, they returned a few days ago, c/o lower back pain, no distress noted

## 2014-06-29 NOTE — ED Notes (Addendum)
Pt was seen and treated approx 1 month ago here for UTI.  Sts back pain started back "over the weekend".

## 2014-07-04 ENCOUNTER — Ambulatory Visit: Payer: 59 | Admitting: Family Medicine

## 2014-07-19 ENCOUNTER — Ambulatory Visit: Payer: 59 | Admitting: Family Medicine

## 2014-10-11 ENCOUNTER — Ambulatory Visit (INDEPENDENT_AMBULATORY_CARE_PROVIDER_SITE_OTHER): Payer: 59 | Admitting: Family Medicine

## 2014-10-11 VITALS — BP 145/99 | HR 72 | Temp 98.5°F | Ht 79.0 in | Wt 351.2 lb

## 2014-10-11 DIAGNOSIS — I10 Essential (primary) hypertension: Secondary | ICD-10-CM

## 2014-10-11 DIAGNOSIS — M5442 Lumbago with sciatica, left side: Secondary | ICD-10-CM | POA: Diagnosis not present

## 2014-10-11 DIAGNOSIS — A609 Anogenital herpesviral infection, unspecified: Secondary | ICD-10-CM | POA: Diagnosis not present

## 2014-10-11 DIAGNOSIS — A6000 Herpesviral infection of urogenital system, unspecified: Secondary | ICD-10-CM

## 2014-10-11 MED ORDER — VALACYCLOVIR HCL 500 MG PO TABS: 500.0000 mg | ORAL_TABLET | Freq: Every day | ORAL | Status: DC

## 2014-10-11 NOTE — Patient Instructions (Addendum)
Good to see you today!  Thanks for coming in.  Take ibuprofen 400-600 mg up to three times daily as needed for pain  If you have any weakness or trouble controllng your bladder call me  If you are not better by mid August come back  Continue movement and weight loss  Congrats on the weight loss  Check blood pressure at work should be < 140/90  Use the valacyclovir daily for suppression

## 2014-10-12 ENCOUNTER — Encounter: Payer: Self-pay | Admitting: Family Medicine

## 2014-10-12 DIAGNOSIS — M48062 Spinal stenosis, lumbar region with neurogenic claudication: Secondary | ICD-10-CM

## 2014-10-12 HISTORY — DX: Spinal stenosis, lumbar region with neurogenic claudication: M48.062

## 2014-10-12 NOTE — Assessment & Plan Note (Addendum)
BP Readings from Last 3 Encounters:  10/11/14 145/99  06/29/14 136/95  05/16/14 124/83    Not at goal.  Discussed diet and weight loss and regular medication usuage.  I am not sure if he takes them regularly.  He is not interested in changing now.  Recommend close monitoring

## 2014-10-12 NOTE — Progress Notes (Signed)
   Subjective:    Patient ID: William Franco, male    DOB: 12/09/1975, 39 y.o.   MRN: [id number]  HPI  Back Pain Left lower back with radiation numbness to left thigh in the last few weeks.  Had been seen in ER for similar back pain in March has waxed and waned.  No weakness or incontience or fever or trauma.  Ibuprofen helps.  Sitting makes it worse  Herpes Has history of herpes with infrequent outbreaks (no sure when last one was) take valcyclovir when thinks one maybe coming on.  Has new partner who is allergic to latex condoms.  He wonders about suppressive therapy for herpes  HYPERTENSION Disease Monitoring Home BP Monitoring not checking at work Chest pain- no    Dyspnea- no Medications Compliance-  daily. Lightheadedness-  no  Edema- no ROS - See HPI  PMH Lab Review   POTASSIUM  Date Value Ref Range Status  05/16/2014 3.5 3.5 - 5.1 mmol/L Final   SODIUM  Date Value Ref Range Status  05/16/2014 141 135 - 145 mmol/L Final   CREAT  Date Value Ref Range Status  08/05/2013 0.95 0.50 - 1.35 mg/dL Final   CREATININE, SER  Date Value Ref Range Status  05/16/2014 1.20 0.50 - 1.35 mg/dL Final     Chief Complaint noted Review of Symptoms - see HPI PMH - Smoking status noted.   Vital Signs reviewed      Review of Systems     Objective:   Physical Exam Mildly tender left lower lumbar paraspinous area.  No bony tenderness Neurologic exam :  Strength equal & normal in upper & lower extremities Able to walk on heels and toes and do deep knee bend   Balance normal  No SLR          Assessment & Plan:

## 2014-10-12 NOTE — Assessment & Plan Note (Signed)
Will prescribe suppressive dose of valacyclovir.  Discussed latex free condoms

## 2014-10-12 NOTE — Assessment & Plan Note (Signed)
Seems consistent with mild disc impingement.  Discussed staying active as needed nsaids and that would likely improve over a few weeks.  No signs of infection or cancer or significant weakness

## 2015-02-01 ENCOUNTER — Encounter: Payer: Self-pay | Admitting: Family Medicine

## 2015-02-01 ENCOUNTER — Ambulatory Visit (INDEPENDENT_AMBULATORY_CARE_PROVIDER_SITE_OTHER): Payer: 59 | Admitting: Family Medicine

## 2015-02-01 VITALS — BP 160/99 | HR 80 | Temp 98.7°F | Ht 79.0 in | Wt 352.6 lb

## 2015-02-01 DIAGNOSIS — M5430 Sciatica, unspecified side: Secondary | ICD-10-CM

## 2015-02-01 DIAGNOSIS — M545 Low back pain: Secondary | ICD-10-CM

## 2015-02-01 DIAGNOSIS — M5432 Sciatica, left side: Secondary | ICD-10-CM | POA: Diagnosis not present

## 2015-02-01 DIAGNOSIS — M5440 Lumbago with sciatica, unspecified side: Secondary | ICD-10-CM

## 2015-02-01 NOTE — Progress Notes (Signed)
Patient ID: William Franco, male   DOB: Nov 05, 1975, 39 y.o.   MRN: [id number]  BACK PAIN,  acute worsening of chronic back pain  Have chronic back pain for years.  Usually on right side.  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 Back pain then was acutely triggered when lifting a box of fluid. He took pain meds, did not go for the recommended PT , gradually improved to point where he could return to work.  Xray was done then- "Nerve pain, not a disc"  [name] was seen by Dr Erin Hearing 10/11/14 with left low back pain with sciatica that Dr Erin Hearing thought was consistent with mild disc impingement.  He recommended OTC analgesia and to return in August if patient had not improved.   Current back pain Location: bilateral low back   Quality: typical stiff pain. Feels unsteady with standing. Sense that left leg is "heavier" than the right.   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  He had also started  exercising more at the same time.  Worse with: worst in the morning and when standing up from chair.  Also, walking track cause left leg to go numb after about one lap of the track.  The numbness resolves within a couple minutes after sitting down.    Better with: Sitting down helps pain and numbness to subside after few minutes.  Usually sit down when it goes away. He also notice that pain improves when his legs elevated on ottoman.   Radiation: from left greater trochanter region down lateral thigh and later foreleg into dorsum of left foot and into all the  toes.  Trauma: no recall traumaor a precipitating event  this episode  Best sitting/standing/leaning forward: Sitting,especially with legs up on ottoman.   Eases slightly with Ibuprofen  Prior Imaging: CT scan of abdomin/pelvis (05/16/14) for work-up of hematuria and dyysuria showed: spinal stenosis at  L3-4 and L4-5 due to congenital narrowing as well as diffuse disc protrusion and bony hypertrophy. Borderline stenosis is noted at L2-3 due to similar factors. There is degenerative change in the lumbar spine.   Red Flags Fecal/urinary incontinence: no  Numbness/Weakness: yes, heaviness in left leg.  He feels like left leg is weaker than right leg when doing shopping or other ambulating activities.   Fever/chills/sweats: no  Night pain: no  Unexplained weight loss: no  No relief with bedrest: no, laying down helps relieve back and left leg pain   h/o cancer/immunosuppression: no  IV drug use: no  PMH of osteoporosis or chronic steroid use: no    Physical Exam VS reviewed Gen: no acute distress, cooperative, pleasant Back Exam: Inspection: no kyphosis or scoliosis Motion: RSLR lying (+) pain in back                          LSLR lying: negative RXSLR seated:  tingling down left leg                             L XSLR seated: negative Palpable tenderness: no tenderness to percussion of lumbar spinous processes.  FABER: negative bilaterally.  Sensory change: Intact L4/L5/S1 sensory distribution to touch bilaterally Reflex change:     0 ankle left,  (+) 1 ankle right                             (+)  1 knee left,   (+) 1 right knee  Strength at foot Plantar-flexion: 5 / 5    Dorsi-flexion: 5/ 5   bilaterally Leg strength Quad: 5 / 5   Hamstring: 5 / 5   Hip flexor: 5 / 5   Hip abductors:5  / 5 bilaterally Gait Walking: nonatalgic          Heels: able          Toes:   able       Pulses: (+) 1 DP pulses bilaterally.

## 2015-02-01 NOTE — Patient Instructions (Signed)
Disc and Back Rehabilitation:   1.  Symptomatic Treatment and Monitoring:   A.  Anti-inflammatory Medication: Naproxen sodium (Aleve) 220 mg 2 pills 2 times per day with food. If stomach discomfort or any other side effect develops, stop the medication and contact the office.  You should have your blood pressure checked about a week after beginning treatment.  Make sure that your primary care physician is aware that you are taking this and all other medications if he / she is prescribing other medications.  Many newer anti-inflammatories do not provide protection against heart attacks, and there is some concern that some NSAIDs may increase heart attack risk in some individuals.  Patients with known heart disease or cardiac risk factors should not take NSAIDs long term. The use of daily aspirin should be discussed with your primary care physician. Do not take NSAIDs at the same time as prednisone - Restart after prednisone if needed  B. Pulsed course of Corticosteroids: Medrol dose pak or Prednisone 60mg  per day for 6 days. All anti-inflammatory medications should be discontinued while taking Prednisone. Corticosteroids can significantly reduce nerve inflammation and provide pain relief and possibly help restore nerve function. There are several possible side effects including nausea, stomach upset, insomnia and irritability. A very rare side effect is avascular necrosis (AVN) of the hip in which the hip loses its blood supply, which can necessitate a hip replacement. The exact rate is unknown but very low. A definite relationship has been identified. The risks of progressive nerve damage often outweigh the remote risk of hip complications.   C. Tylenol Extra strength (500 mg). Take 2 pills up to 4 times a day if needed for pain  D. Other pain medication: Hydrocodone (Lortab/Vicodin) 7.5 mg, 1-2 pills 3-4 times per day. Sometimes a short course of stronger medication is necessary for pain control.  Narcotics can impair your ability to concentrate, so do not drive or operate machinery while taking these medications. Narcotics can be habit forming, so prolonged use is not recommended if there is not a clear cut diagnosis and solution to your problem. Do not drink alcohol when taking this medication. Use caution if taking other sedatives.  E.  Physical Therapy:  The therapist will provide local treatments to give pain relief as well as strengthening and flexibility exercises to help stabilize your back.  You will also receive mechanical or pool traction to help relieve disc compression.  F.  Symptom Monitoring:  Walk on your heels, and then your toes, up and down your hallway once a day.  If you lose your ability to do this due to weakness,  report this to [place] immediately.  A loss of bowel or bladder control consists of inability to urinate when the bladder is full and/or spontaneous unaware release of bladder or bowels. If either of these conditions should occur it should be reported to our office immediately.  Dutch Quint Extension Exercises:  This is similar to a sloppy push-up. Lie face down, and then go up on your elbows to extend your lower back, while leaving your hips on the floor.  Complete 3 sets of 5 repetitions of 10 second hold, 3 to 4 times a day.    2) Further Measures:     90% of people with herniated discs get better without an operation. More aggressive treatment is sometimes needed if the above measures don't improve your symptoms.   A.  MRI:  This can document the anatomy of the discs, nerves,  and soft tissues in your back. Many asymptomatic individuals have abnormal discs on MRI, especially with increasing age.  An immediate MRI is not always needed if the patient does not have a surgical indication, as your initial treatment will not be changed by the confirmation of an abnormal disc. If your pain does not subside with treatment, a MRI can be used to guide further treatment.    B. Nerve Root Injections: These are done under X-Ray control. Cortisone and local anesthetic are- injected around the nerve roots. This is helpful from both a diagnostic and therapeutic standpoint. These will provide symptomatic relief, which in many cases is extended or complete. Your response to the injection will help confirm that the nerve root is in fact the cause of your pain, which will help with further management.   Epidural and Facet injections are often helpful in treating back pain not associated with leg pain.    C.  Surgery:  This is only needed in about 2% of patients. Indications for an operation are progressive or significant loss of strength, loss of bowel or bladder control, or prolonged disabling pain that is unrelieved with conservative treatment. Minor strength loss is not an indication for immediate surgery, as there is no evidence that strength or sensory loss will return any faster with surgery than with conservative therapy. Surgery is most successful in patients with significant leg pain with less pronounced back pain. If there is a significant back pain component, spinal fusion may be needed, but the results are less predictable. Further diagnostic testing such as a discogram may be necessary prior to surgery if there is a pronounced back pain component.

## 2015-02-02 ENCOUNTER — Encounter: Payer: Self-pay | Admitting: Family Medicine

## 2015-02-02 NOTE — Assessment & Plan Note (Addendum)
Established problem worsened.  Back pain with left leg numbness in a radicular pattern around L5 or S1 on left. Persistent after conservative management for 5 to 6 months.  Interfering with patient's work, though he has not missed work from the symptoms Symptoms consistently worsen with walking and relieve relatively quickly withsitting Prior Abdominopelvic CT in January showed congenital lumbar spinal stenosis   Given possible symptomatic lumbar spinal stenosis that is interfering with William Franco function, I did recommend further imaging with Lumbar MRI. If significant spinal canal stenosis is present, would discuss with William Franco options of PT, Epidural corticosteroid injections, or surgical consultation for consideration of decompressive laminectomy with goal of relieving symptoms and improving function.

## 2015-02-07 ENCOUNTER — Telehealth: Payer: Self-pay | Admitting: Family Medicine

## 2015-02-07 ENCOUNTER — Ambulatory Visit (HOSPITAL_COMMUNITY)
Admission: RE | Admit: 2015-02-07 | Discharge: 2015-02-07 | Disposition: A | Discharge status: Home / Self Care | Payer: 59 | Source: Ambulatory Visit | Attending: Family Medicine | Admitting: Family Medicine

## 2015-02-07 ENCOUNTER — Encounter: Payer: Self-pay | Admitting: Family Medicine

## 2015-02-07 DIAGNOSIS — M5432 Sciatica, left side: Secondary | ICD-10-CM | POA: Diagnosis not present

## 2015-02-07 DIAGNOSIS — M4806 Spinal stenosis, lumbar region: Secondary | ICD-10-CM | POA: Insufficient documentation

## 2015-02-07 DIAGNOSIS — M48062 Spinal stenosis, lumbar region with neurogenic claudication: Secondary | ICD-10-CM

## 2015-02-07 NOTE — Telephone Encounter (Signed)
Established Problem that has worsened recently  Lumbar MRI (02/07/15) shows L4-L5: Disc narrowing with circumferential bulging and a right eccentric central disc extrusion which migrates inferiorly. There is facet arthropathy with ligamentous and bony overgrowth that is moderate. Canal stenosis is advanced, with minimal residual subarachnoid space. Mild bilateral foraminal narrowing, greater on the left.  L5-S1:Facet arthropathy with moderate overgrowth. Mild annulus bulging. Mid degenerative stenosis.  IMPRESSION: 1. L4-5 large central disc extrusion with advanced canal stenosis and nerve compression. 2. 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 3. Multilevel facet arthropathy, as above.  Given that there may be a role of disc disease beyond the advanced lumbar stenosis, it seem reasonable to refer William Franco to Neurosurger for consultation as to whether discectomy or laminectomy indicated, or should patient try 6 week trial of PT, +/- trial of epidural injections. Will make referral to Dr Vertell Limber (NS) for consultation outpatient   William Franco was agreeable to the NS referral.  He did not have any further questions.

## 2015-02-07 NOTE — Assessment & Plan Note (Signed)
Established Problem that has worsened recently  Lumbar MRI (02/07/15) shows L4-L5: Disc narrowing with circumferential bulging and a right eccentric central disc extrusion which migrates inferiorly. There is facet arthropathy with ligamentous and bony overgrowth that is moderate. Canal stenosis is advanced, with minimal residual subarachnoid space. Mild bilateral foraminal narrowing, greater on the left.  L5-S1:Facet arthropathy with moderate overgrowth. Mild annulus bulging. Mid degenerative stenosis.  IMPRESSION: 1. L4-5 large central disc extrusion with advanced canal stenosis and nerve compression. 2. 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 3. Multilevel facet arthropathy, as above.  Given that there may be a role of disc disease beyond the advanced lumbar stenosis, it seem reasonable to refer William Franco to Neurosurger for consultation as to whether discectomy or laminectomy indicated, or should patient try 6 week trial of PT, +/- trial of epidural injections. Will make referral to Dr Vertell Limber (NS) for consultation outpatient

## 2015-02-21 ENCOUNTER — Ambulatory Visit: Payer: 59 | Admitting: Family Medicine

## 2015-03-15 ENCOUNTER — Other Ambulatory Visit: Payer: Self-pay | Admitting: *Deleted

## 2015-03-16 MED ORDER — LOSARTAN POTASSIUM 100 MG PO TABS: 100.0000 mg | ORAL_TABLET | Freq: Every day | ORAL | Status: DC

## 2015-05-03 ENCOUNTER — Other Ambulatory Visit: Payer: Self-pay | Admitting: *Deleted

## 2015-05-04 MED ORDER — SILDENAFIL CITRATE 100 MG PO TABS: 100.0000 mg | ORAL_TABLET | ORAL | Status: DC | PRN

## 2015-05-15 MED FILL — VIAGRA 100 MG TABLET: 100 | 30 days supply | Qty: 6 | Fill #0

## 2015-05-15 MED FILL — LOSARTAN POTASSIUM 100 MG T: 100 | 90 days supply | Qty: 90 | Fill #0

## 2015-06-18 DIAGNOSIS — M545 Low back pain: Secondary | ICD-10-CM | POA: Diagnosis not present

## 2015-06-18 DIAGNOSIS — I10 Essential (primary) hypertension: Secondary | ICD-10-CM | POA: Diagnosis not present

## 2015-06-18 DIAGNOSIS — M4806 Spinal stenosis, lumbar region: Secondary | ICD-10-CM | POA: Diagnosis not present

## 2015-06-18 DIAGNOSIS — Z6841 Body Mass Index (BMI) 40.0 and over, adult: Secondary | ICD-10-CM | POA: Diagnosis not present

## 2015-06-18 DIAGNOSIS — M5126 Other intervertebral disc displacement, lumbar region: Secondary | ICD-10-CM | POA: Diagnosis not present

## 2015-06-18 DIAGNOSIS — M5416 Radiculopathy, lumbar region: Secondary | ICD-10-CM | POA: Diagnosis not present

## 2015-06-22 MED FILL — HYDROCODON-APAP 5-325: 5-325 | 8 days supply | Qty: 60 | Fill #0

## 2015-07-02 ENCOUNTER — Other Ambulatory Visit: Payer: Self-pay | Admitting: Family Medicine

## 2015-07-02 MED FILL — VALACYCLOVIR HCL 500 MG TAB: 500 | 90 days supply | Qty: 90 | Fill #0

## 2015-07-03 MED FILL — HYDROCHLOROTHIAZIDE 25 MG T: 25 | 90 days supply | Qty: 90 | Fill #0

## 2015-07-31 DIAGNOSIS — M4806 Spinal stenosis, lumbar region: Secondary | ICD-10-CM | POA: Diagnosis not present

## 2015-07-31 DIAGNOSIS — M5116 Intervertebral disc disorders with radiculopathy, lumbar region: Secondary | ICD-10-CM | POA: Diagnosis not present

## 2015-07-31 DIAGNOSIS — M5106 Intervertebral disc disorders with myelopathy, lumbar region: Secondary | ICD-10-CM | POA: Diagnosis not present

## 2015-07-31 MED FILL — tiZANidine HCL 2 MG TABS: 2 | 5 days supply | Qty: 60 | Fill #0

## 2015-07-31 MED FILL — OXYCODONE/APAP 5-325: 5-325 | 7 days supply | Qty: 80 | Fill #0

## 2015-09-17 ENCOUNTER — Other Ambulatory Visit: Payer: Self-pay | Admitting: Family Medicine

## 2015-10-08 MED FILL — MELOXICAM 15 MG TABLET: 15 | 30 days supply | Qty: 30 | Fill #0

## 2015-10-10 ENCOUNTER — Ambulatory Visit: Payer: 59 | Admitting: Family Medicine

## 2015-10-11 ENCOUNTER — Other Ambulatory Visit: Payer: Self-pay | Admitting: Family Medicine

## 2015-10-11 MED FILL — OXYCODONE/APAP 5-325: 5-325 | 10 days supply | Qty: 60 | Fill #0

## 2015-10-11 MED FILL — LOSARTAN POTASSIUM 100 MG T: 100 | 90 days supply | Qty: 90 | Fill #0

## 2015-10-11 MED FILL — HYDROCHLOROTHIAZIDE 25 MG T: 25 | 30 days supply | Qty: 30 | Fill #0

## 2015-10-11 MED FILL — tiZANidine HCL 2 MG TABS: 2 | 10 days supply | Qty: 60 | Fill #0

## 2015-10-25 ENCOUNTER — Other Ambulatory Visit: Payer: Self-pay | Admitting: Family Medicine

## 2015-10-25 MED FILL — VALACYCLOVIR HCL 500 MG TAB: 500 | 90 days supply | Qty: 90 | Fill #0

## 2015-10-25 MED FILL — VIAGRA 100 MG TABLET: 100 | 30 days supply | Qty: 6 | Fill #1

## 2015-11-14 ENCOUNTER — Ambulatory Visit (INDEPENDENT_AMBULATORY_CARE_PROVIDER_SITE_OTHER): Payer: 59 | Admitting: Family Medicine

## 2015-11-14 ENCOUNTER — Encounter: Payer: Self-pay | Admitting: Family Medicine

## 2015-11-14 VITALS — BP 156/95 | HR 66 | Temp 98.1°F | Ht >= 80 in | Wt 364.0 lb

## 2015-11-14 DIAGNOSIS — Z720 Tobacco use: Secondary | ICD-10-CM | POA: Diagnosis not present

## 2015-11-14 DIAGNOSIS — Z114 Encounter for screening for human immunodeficiency virus [HIV]: Secondary | ICD-10-CM | POA: Diagnosis not present

## 2015-11-14 DIAGNOSIS — I10 Essential (primary) hypertension: Secondary | ICD-10-CM

## 2015-11-14 LAB — BASIC METABOLIC PANEL
BUN: 16 mg/dL (ref 7–25)
CALCIUM: 9.1 mg/dL (ref 8.6–10.3)
CO2: 27 mmol/L (ref 20–31)
Chloride: 102 mmol/L (ref 98–110)
Creat: 0.97 mg/dL (ref 0.60–1.35)
Glucose, Bld: 128 mg/dL — ABNORMAL HIGH (ref 65–99)
Potassium: 3.7 mmol/L (ref 3.5–5.3)
Sodium: 139 mmol/L (ref 135–146)

## 2015-11-14 MED ORDER — AMLODIPINE BESYLATE 10 MG PO TABS: 10.0000 mg | ORAL_TABLET | Freq: Every day | ORAL | Status: DC

## 2015-11-14 NOTE — Assessment & Plan Note (Signed)
Not at goal.  Check labs and add amlodipine

## 2015-11-14 NOTE — Assessment & Plan Note (Signed)
Smokes about 3 cigs on weekends

## 2015-11-14 NOTE — Progress Notes (Signed)
Subjective  Patient is presenting with the following illnesses  HYPERTENSION Disease Monitoring Home BP Monitoring (Severity) not checking but usually high Symptoms - Chest pain- no    Dyspnea- no Medications(Modifying factors) Compliance-  Taking regularly. Lightheadedness-  no  Edema- no Timing - continuous  Duration - years ROS - See HPI  PMH Lab Review   POTASSIUM  Date Value Ref Range Status  05/16/2014 3.5 3.5 - 5.1 mmol/L Final   SODIUM  Date Value Ref Range Status  05/16/2014 141 135 - 145 mmol/L Final   CREAT  Date Value Ref Range Status  08/05/2013 0.95 0.50 - 1.35 mg/dL Final   CREATININE, SER  Date Value Ref Range Status  05/16/2014 1.20 0.50 - 1.35 mg/dL Final        Chief Complaint noted Review of Symptoms - see HPI PMH - Smoking status noted.     Objective Vital Signs reviewed Heart - Regular rate and rhythm.  No murmurs, gallops or rubs.    Lungs:  Normal respiratory effort, chest expands symmetrically. Lungs are clear to auscultation, no crackles or wheezes. Extremities:  No cyanosis, edema, or deformity noted with good range of motion of all major joints.       Assessments/Plans  See Encounter view if individual problem A/Ps not visible See after visit summary for details of patient instuctions

## 2015-11-14 NOTE — Patient Instructions (Signed)
Good to see you today!  Thanks for coming in.  For the blood pressure Start amlodipine 10 mg once a day with the other medications Check your blood pressure your goal < 140/90 If any problems with the medicaton then call me  Come back in 1 month to check blood pressure. Bring all your medication bottles And your blood pressure readings  I will call you if your lab tests are not normal.  Otherwise we will discuss them at your next visit.

## 2015-11-15 LAB — HIV ANTIBODY (ROUTINE TESTING W REFLEX): HIV: NONREACTIVE

## 2015-11-27 ENCOUNTER — Other Ambulatory Visit: Payer: Self-pay | Admitting: Family Medicine

## 2015-11-27 MED FILL — HYDROCHLOROTHIAZIDE 25 MG T: 25 | 90 days supply | Qty: 90 | Fill #0

## 2015-11-27 MED FILL — AMLODIPINE BESYLATE 10 MG T: 10 | 30 days supply | Qty: 30 | Fill #0

## 2016-01-21 ENCOUNTER — Other Ambulatory Visit: Payer: Self-pay | Admitting: Family Medicine

## 2016-01-21 MED FILL — AMLODIPINE BESYLATE 10 MG T: 10 | 30 days supply | Qty: 30 | Fill #1

## 2016-01-21 MED FILL — VIAGRA 100 MG TABLET: 100 | 30 days supply | Qty: 6 | Fill #2

## 2016-02-13 ENCOUNTER — Other Ambulatory Visit: Payer: Self-pay | Admitting: Family Medicine

## 2016-02-13 MED FILL — HYDROCHLOROTHIAZIDE 25 MG T: 25 | 90 days supply | Qty: 90 | Fill #1

## 2016-02-13 MED FILL — LOSARTAN POTASSIUM 100 MG T: 100 | 90 days supply | Qty: 90 | Fill #0

## 2016-02-14 ENCOUNTER — Encounter (HOSPITAL_COMMUNITY): Payer: Self-pay | Admitting: Family Medicine

## 2016-02-14 ENCOUNTER — Ambulatory Visit (HOSPITAL_COMMUNITY)
Admission: EM | Admit: 2016-02-14 | Discharge: 2016-02-14 | Disposition: A | Discharge status: Home / Self Care | Payer: 59 | Attending: Family Medicine | Admitting: Family Medicine

## 2016-02-14 DIAGNOSIS — M1000 Idiopathic gout, unspecified site: Secondary | ICD-10-CM

## 2016-02-14 MED ORDER — INDOMETHACIN 25 MG PO CAPS: 25.0000 mg | ORAL_CAPSULE | Freq: Three times a day (TID) | ORAL | 3 refills | Status: DC | PRN

## 2016-02-14 MED FILL — INDOMETHACIN 25 MG CAPSULE: 25 | 10 days supply | Qty: 30 | Fill #0

## 2016-02-14 MED FILL — VALACYCLOVIR HCL 500 MG TAB: 500 | 90 days supply | Qty: 90 | Fill #0

## 2016-02-14 NOTE — ED Triage Notes (Signed)
Pt here for right foot pain with swelling over 2 weeks. sts hx of gout and feels similar.

## 2016-02-14 NOTE — ED Provider Notes (Signed)
[facility]    CSN: [id number] Arrival date & time: 02/14/16  1001     History   Chief Complaint Chief Complaint  Patient presents with  . Foot Pain    HPI William Franco is a 40 y.o. male.   This 40 year old gentleman with pain in his right foot associated with swelling. Patient has had a history of gout and thinks that maybe what's causing this.  He's had the pain for at least a week. It began intermittently and then has become more more steady. The pain is described as sharp. He has taken ibuprofen which has helped a little bit.  The pain is located over the dorsum of his foot and in the ankle on the right side. 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      Past Medical History:  Diagnosis Date  . Hypertension   . Kidney stones   . 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 Erectile dysfunction 04/19/2012  . Hypertension 12/18/2010  . Recurrent genital herpes 12/18/2010  . Tobacco abuse 12/18/2010  . Obesity 12/18/2010    Past Surgical History:  Procedure Laterality Date  . none         Home Medications    Prior to Admission medications   Medication Sig Start Date End Date Taking? Authorizing Provider  amLODipine (NORVASC) 10 MG tablet Take 1 tablet (10 mg total) by mouth daily. 11/14/15   Lind Covert, MD  hydrochlorothiazide (HYDRODIURIL) 25 MG tablet TAKE 1 TABLET BY MOUTH DAILY. 11/27/15   Lind Covert, MD  indomethacin (INDOCIN) 25 MG capsule Take 1 capsule (25 mg total) by mouth 3 (three) times daily as needed. 02/14/16   Robyn Haber, MD  losartan (COZAAR) 100 MG tablet TAKE 1 TABLET (100 MG TOTAL) BY MOUTH DAILY. 01/23/16   Lind Covert, MD  sildenafil (VIAGRA) 100 MG tablet Take 1 tablet  (100 mg total) by mouth as needed for erectile dysfunction. 05/04/15 05/02/16  Lind Covert, MD  valACYclovir (VALTREX) 500 MG tablet TAKE 1 TABLET BY MOUTH DAILY. 02/14/16   Lind Covert, MD    Family History Family History  Problem Relation Age of Onset  . Hypertension Father   . Hypertension Mother     Social History Social History  Substance Use Topics  . Smoking status: Current Every Day Smoker    Packs/day: 0.10  . Smokeless tobacco: Current User  . Alcohol use Yes     Comment: ocassionaly     Allergies   Review of patient's allergies indicates no known allergies.   Review of Systems Review of Systems  Constitutional: Negative.   Respiratory: Negative.   Cardiovascular: Negative.   Gastrointestinal: Negative.   Musculoskeletal: Positive for arthralgias, gait problem and joint swelling.     Physical Exam Triage Vital Signs ED Triage Vitals  Enc Vitals Group     BP 02/14/16 1037 165/96     Pulse Rate 02/14/16 1037 68     Resp 02/14/16 1037 18     Temp 02/14/16 1037 98.4 F (36.9 C)     Temp src --      SpO2 02/14/16 1037 98 %     Weight --      Height --  Head Circumference --      Peak Flow --      Pain Score 02/14/16 1038 3     Pain Loc --      Pain Edu? --      Excl. in [place]? --    No data found.   Updated Vital Signs BP 165/96   Pulse 68   Temp 98.4 F (36.9 C)   Resp 18   SpO2 98%        Physical Exam  Constitutional: He appears well-developed and well-nourished.  Musculoskeletal:  Right foot shows mild swelling over the dorsum and into the ankle joint region. There is no erythema and there no skin breaks. There is no ecchymosis. There is no bony abnormality.  Patient has pes planus. There is no swelling in the first MTP joint. He is tender diffusely over the dorsum of the foot and along the anterior aspect of his ankle joint.  Nursing note and vitals reviewed.    UC Treatments / Results  Labs (all labs ordered  are listed, but only abnormal results are displayed) Labs Reviewed - No data to display  EKG  EKG Interpretation None       Radiology No results found.  Procedures Procedures (including critical care time)  Medications Ordered in UC Medications - No data to display   Initial Impression / Assessment and Plan / UC Course  I have reviewed the triage vital signs and the nursing notes.  Pertinent labs & imaging results that were available during my care of the patient were reviewed by me and considered in my medical decision making (see chart for details).  Clinical Course      Final Clinical Impressions(s) / UC Diagnoses   Final diagnoses:  Acute idiopathic gout, unspecified site    New Prescriptions New Prescriptions   INDOMETHACIN (INDOCIN) 25 MG CAPSULE    Take 1 capsule (25 mg total) by mouth 3 (three) times daily as needed.     Robyn Haber, MD 02/14/16 1105

## 2016-02-20 ENCOUNTER — Ambulatory Visit: Payer: 59 | Admitting: Family Medicine

## 2016-03-10 MED FILL — AMLODIPINE BESYLATE 10 MG T: 10 | 30 days supply | Qty: 30 | Fill #2

## 2016-03-10 MED FILL — INDOMETHACIN 25 MG CAPSULE: 25 | 10 days supply | Qty: 30 | Fill #1

## 2016-03-11 ENCOUNTER — Encounter: Payer: Self-pay | Admitting: *Deleted

## 2016-04-07 MED FILL — INDOMETHACIN 25 MG CAPSULE: 25 | 10 days supply | Qty: 30 | Fill #2

## 2016-04-24 MED FILL — HYDROCODON-APAP 5-325: 5-325 | 3 days supply | Qty: 12 | Fill #0

## 2016-04-30 ENCOUNTER — Other Ambulatory Visit: Payer: Self-pay | Admitting: *Deleted

## 2016-04-30 MED FILL — AMLODIPINE BESYLATE 10 MG T: 10 | 30 days supply | Qty: 30 | Fill #3

## 2016-05-01 MED ORDER — LOSARTAN POTASSIUM 100 MG PO TABS: ORAL_TABLET | ORAL | 1 refills | Status: DC

## 2016-05-06 ENCOUNTER — Other Ambulatory Visit: Payer: Self-pay | Admitting: Family Medicine

## 2016-06-11 ENCOUNTER — Ambulatory Visit: Payer: 59 | Admitting: Family Medicine

## 2016-06-23 ENCOUNTER — Other Ambulatory Visit: Payer: Self-pay | Admitting: Family Medicine

## 2016-06-23 DIAGNOSIS — I10 Essential (primary) hypertension: Secondary | ICD-10-CM

## 2016-06-23 MED FILL — HYDROCHLOROTHIAZIDE 25 MG T: 25 | 90 days supply | Qty: 90 | Fill #2

## 2016-06-23 MED FILL — AMLODIPINE BESYLATE 10 MG T: 10 | 30 days supply | Qty: 30 | Fill #0

## 2016-06-23 MED FILL — SILDENAFIL 100 MG TABLET: 100 | 30 days supply | Qty: 6 | Fill #0

## 2016-06-23 MED FILL — INDOMETHACIN 25 MG CAPSULE: 25 | 10 days supply | Qty: 30 | Fill #3

## 2016-06-23 MED FILL — LOSARTAN POTASSIUM 100 MG T: 100 | 90 days supply | Qty: 90 | Fill #0

## 2016-08-06 ENCOUNTER — Encounter: Payer: Self-pay | Admitting: Family Medicine

## 2016-08-06 ENCOUNTER — Ambulatory Visit (INDEPENDENT_AMBULATORY_CARE_PROVIDER_SITE_OTHER): Payer: 59 | Admitting: Family Medicine

## 2016-08-06 VITALS — BP 148/90 | HR 60 | Temp 98.3°F | Ht >= 80 in | Wt 365.4 lb

## 2016-08-06 DIAGNOSIS — R739 Hyperglycemia, unspecified: Secondary | ICD-10-CM | POA: Diagnosis not present

## 2016-08-06 DIAGNOSIS — I10 Essential (primary) hypertension: Secondary | ICD-10-CM

## 2016-08-06 DIAGNOSIS — M1A0790 Idiopathic chronic gout, unspecified ankle and foot, without tophus (tophi): Secondary | ICD-10-CM

## 2016-08-06 DIAGNOSIS — M109 Gout, unspecified: Secondary | ICD-10-CM | POA: Insufficient documentation

## 2016-08-06 LAB — POCT GLYCOSYLATED HEMOGLOBIN (HGB A1C): Hemoglobin A1C: 5.9

## 2016-08-06 MED ORDER — AMLODIPINE BESYLATE 10 MG PO TABS: 10.0000 mg | ORAL_TABLET | Freq: Every day | ORAL | 0 refills | Status: DC

## 2016-08-06 MED ORDER — LOSARTAN POTASSIUM 100 MG PO TABS: ORAL_TABLET | ORAL | 0 refills | Status: DC

## 2016-08-06 MED ORDER — VALACYCLOVIR HCL 500 MG PO TABS: 500.0000 mg | ORAL_TABLET | Freq: Every day | ORAL | 0 refills | Status: DC

## 2016-08-06 MED FILL — VALACYCLOVIR HCL 500 MG TAB: 500 | 90 days supply | Qty: 90 | Fill #0

## 2016-08-06 MED FILL — AMLODIPINE BESYLATE 10 MG T: 10 | 90 days supply | Qty: 90 | Fill #0

## 2016-08-06 NOTE — Assessment & Plan Note (Signed)
Unchanged   We discussed his high blood sugar on labs and that his A1c was normal so far.  Discussed diet

## 2016-08-06 NOTE — Assessment & Plan Note (Signed)
By history.  Next blood test will check UA.   Stopped HCTZ but may need to be restarted if blood pressure is poorly controlled.  He was reluctant to stop since has had only a few attacks

## 2016-08-06 NOTE — Patient Instructions (Addendum)
Good to see you today!  Thanks for coming in.  For your BP  Stop the HCTZ  Check your blood pressure - 3-4 times a week,  Bring in the readings next month  If regularly > 160/90 then call me   Work on the weight  Eating less is the major way to deal with this  Cut way back on sweet drinks  It will decrease your risk of diabetes and improve your knees   See your back doctor  If he does not think it is related to the the nerves we may need to test your blood vessels   Come back in one month

## 2016-08-06 NOTE — Assessment & Plan Note (Signed)
BP Readings from Last 3 Encounters:  08/06/16 (!) 148/90  02/14/16 165/96  11/14/15 (!) 156/95   Close to goal today.  Given had a gout attack will stop HCTZ and see how he does.  See after visit summary

## 2016-08-06 NOTE — Progress Notes (Signed)
Subjective  Patient is presenting with the following illnesses  Hyperglycemia on BMET No polyuria or dipsia or foot problems other than gout  HYPERTENSION Disease Monitoring  Home BP Monitoring (Severity) not checking Symptoms - Chest pain- no    Dyspnea- no Medications (Modifying factors) Compliance-  Forgot yesterday. Lightheadedness-  no  Edema- no Timing - continuous  Duration - years ROS - See HPI  GOUT Has had two attacks in his life.  Has been taking HCTZ for long time.  No pain now.  Unsure of Uric Acid every checked   PMH Lab Review   Potassium  Date Value Ref Range Status  11/14/2015 3.7 3.5 - 5.3 mmol/L Final   Sodium  Date Value Ref Range Status  11/14/2015 139 135 - 146 mmol/L Final   Creat  Date Value Ref Range Status  11/14/2015 0.97 0.60 - 1.35 mg/dL Final       Chief Complaint noted Review of Symptoms - see HPI PMH - Smoking status noted.     Objective Vital Signs reviewed     Assessments/Plans  No problem-specific Assessment & Plan notes found for this encounter.   See Encounter view if individual problem A/Ps not visible See after visit summary for details of patient instuctions

## 2016-09-10 ENCOUNTER — Telehealth: Payer: Self-pay | Admitting: Family Medicine

## 2016-09-10 ENCOUNTER — Ambulatory Visit: Payer: 59 | Admitting: Family Medicine

## 2016-09-10 DIAGNOSIS — Z91199 Patient's noncompliance with other medical treatment and regimen due to unspecified reason: Secondary | ICD-10-CM

## 2016-09-10 DIAGNOSIS — Z5329 Procedure and treatment not carried out because of patient's decision for other reasons: Secondary | ICD-10-CM | POA: Insufficient documentation

## 2016-09-10 NOTE — Telephone Encounter (Signed)
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

## 2016-09-18 ENCOUNTER — Encounter: Payer: Self-pay | Admitting: Family Medicine

## 2016-09-18 NOTE — Progress Notes (Signed)
Subjective  Patient is presenting with the following illnesses     Chief Complaint noted Review of Symptoms - see HPI PMH - Smoking status noted.     Objective Vital Signs reviewed     Assessments/Plans  No problem-specific Assessment & Plan notes found for this encounter.   See Encounter view if individual problem A/Ps not visible See after visit summary for details of patient instuctions 

## 2016-09-24 ENCOUNTER — Ambulatory Visit (INDEPENDENT_AMBULATORY_CARE_PROVIDER_SITE_OTHER): Payer: 59 | Admitting: Family Medicine

## 2016-09-24 ENCOUNTER — Encounter: Payer: Self-pay | Admitting: Family Medicine

## 2016-09-24 DIAGNOSIS — I10 Essential (primary) hypertension: Secondary | ICD-10-CM | POA: Diagnosis not present

## 2016-09-24 DIAGNOSIS — M1A0790 Idiopathic chronic gout, unspecified ankle and foot, without tophus (tophi): Secondary | ICD-10-CM

## 2016-09-24 DIAGNOSIS — E6609 Other obesity due to excess calories: Secondary | ICD-10-CM | POA: Diagnosis not present

## 2016-09-24 DIAGNOSIS — Z6836 Body mass index (BMI) 36.0-36.9, adult: Secondary | ICD-10-CM

## 2016-09-24 NOTE — Patient Instructions (Signed)
Great work on the [employer]  Your goal is around 320.  Aim to lose 1-2 lb a week the way you are  Keep taking your medications as you are.  Your goal blood pressure is < 150/90.  Check it every so often at work.   If it is regularly great than that then call me  I will call you if your tests are not good.  Otherwise I will send you a letter.  If you do not hear from me with in 2 weeks please call our office.     Come back in 3-6 months

## 2016-09-24 NOTE — Progress Notes (Signed)
Subjective  Patient is presenting with the following illnesses  Obesity Has lost 16 lbs by cutting out sweet drinks and better exercise.   Feels like this has helped his back pain  HYPERTENSION Disease Monitoring  Home BP Monitoring (Severity) checked twice o153/95 and 148/86  Symptoms - Chest pain- no    Dyspnea- no Medications (Modifying factors) Compliance-  Continued to take HCTZ daily. Lightheadedness-  no  Edema- no Timing - continuous  Duration - years ROS - See HPI  GOUT No attacks recently.  No joint pain or tophi.   Feels like it was related to etoh consumption in past.    PMH Lab Review   Potassium  Date Value Ref Range Status  11/14/2015 3.7 3.5 - 5.3 mmol/L Final   Sodium  Date Value Ref Range Status  11/14/2015 139 135 - 146 mmol/L Final   Creat  Date Value Ref Range Status  11/14/2015 0.97 0.60 - 1.35 mg/dL Final           Chief Complaint noted Review of Symptoms - see HPI PMH - Smoking status noted.     Objective Vital Signs reviewed Alert interactive     Assessments/Plans  No problem-specific Assessment & Plan notes found for this encounter.   See Encounter view if individual problem A/Ps not visible See after visit summary for details of patient instuctions

## 2016-09-24 NOTE — Assessment & Plan Note (Addendum)
Nearly at goal.  Given he is already on 3 medications and losing weight will continue current treatment.  He really wants to stay on HCTZ (continued to take even after I asked him to stop last visit)  Given no recent gout attacks will continue

## 2016-09-24 NOTE — Assessment & Plan Note (Signed)
Improved see after visit summary  

## 2016-09-24 NOTE — Assessment & Plan Note (Signed)
Stable - will check Uric acid

## 2016-09-25 ENCOUNTER — Encounter: Payer: Self-pay | Admitting: Family Medicine

## 2016-09-25 LAB — BASIC METABOLIC PANEL
BUN / CREAT RATIO: 17 (ref 9–20)
BUN: 16 mg/dL (ref 6–24)
CHLORIDE: 101 mmol/L (ref 96–106)
CO2: 21 mmol/L (ref 18–29)
CREATININE: 0.95 mg/dL (ref 0.76–1.27)
Calcium: 9.6 mg/dL (ref 8.7–10.2)
GFR calc Af Amer: 114 mL/min/{1.73_m2} (ref >59)
GFR calc non Af Amer: 99 mL/min/{1.73_m2} (ref >59)
GLUCOSE: 125 mg/dL — AB (ref 65–99)
Potassium: 3.8 mmol/L (ref 3.5–5.2)
SODIUM: 140 mmol/L (ref 134–144)

## 2016-09-25 LAB — URIC ACID: Uric Acid: 6.3 mg/dL (ref 3.7–8.6)

## 2016-09-25 NOTE — Progress Notes (Signed)
Subjective  Patient is presenting with the following illnesses     Chief Complaint noted Review of Symptoms - see HPI PMH - Smoking status noted.     Objective Vital Signs reviewed     Assessments/Plans  No problem-specific Assessment & Plan notes found for this encounter.   See Encounter view if individual problem A/Ps not visible See after visit summary for details of patient instuctions 

## 2016-10-14 ENCOUNTER — Telehealth: Payer: Self-pay | Admitting: Family Medicine

## 2016-10-14 NOTE — Telephone Encounter (Signed)
Weight Loss: He is still exercising.   BP: He has only measured it yesterday and it was 168/90. Let him know his goal blood pressure is < 150/90 and advised to check it more often. Told him if there isn't any change in his bp make an appt. - Mesha Guinyard

## 2016-11-14 ENCOUNTER — Other Ambulatory Visit: Payer: Self-pay | Admitting: *Deleted

## 2016-11-14 DIAGNOSIS — I10 Essential (primary) hypertension: Secondary | ICD-10-CM

## 2016-11-14 MED ORDER — HYDROCHLOROTHIAZIDE 25 MG PO TABS: 25.0000 mg | ORAL_TABLET | Freq: Every day | ORAL | 0 refills | Status: DC

## 2016-11-14 MED ORDER — AMLODIPINE BESYLATE 10 MG PO TABS: 10.0000 mg | ORAL_TABLET | Freq: Every day | ORAL | 0 refills | Status: DC

## 2016-11-14 MED FILL — HYDROCHLOROTHIAZIDE 25 MG T: 25 | 90 days supply | Qty: 90 | Fill #0

## 2016-11-14 MED FILL — AMLODIPINE BESYLATE 10 MG T: 10 | 90 days supply | Qty: 90 | Fill #0

## 2016-11-14 MED FILL — LOSARTAN POTASSIUM 100 MG T: 100 | 90 days supply | Qty: 90 | Fill #0

## 2017-02-11 ENCOUNTER — Encounter: Payer: Self-pay | Admitting: Family Medicine

## 2017-02-11 ENCOUNTER — Ambulatory Visit (INDEPENDENT_AMBULATORY_CARE_PROVIDER_SITE_OTHER): Payer: 59 | Admitting: Family Medicine

## 2017-02-11 DIAGNOSIS — Z202 Contact with and (suspected) exposure to infections with a predominantly sexual mode of transmission: Secondary | ICD-10-CM

## 2017-02-11 DIAGNOSIS — I10 Essential (primary) hypertension: Secondary | ICD-10-CM | POA: Diagnosis not present

## 2017-02-11 DIAGNOSIS — Z720 Tobacco use: Secondary | ICD-10-CM

## 2017-02-11 NOTE — Assessment & Plan Note (Signed)
BP Readings from Last 3 Encounters:  02/11/17 128/90  09/24/16 (!) 152/88  08/06/16 (!) 148/90   At goal today.  Continue lifestyle changes

## 2017-02-11 NOTE — Assessment & Plan Note (Signed)
chck labs

## 2017-02-11 NOTE — Patient Instructions (Signed)
Good to see you today!  Thanks for coming in.  I will call you if your tests are not good.  Otherwise I will send you a letter.  If you do not hear from me with in 2 weeks please call our office.     Think about stop smoking  And save money

## 2017-02-11 NOTE — Assessment & Plan Note (Signed)
A bit precontemplative - Discussed reasons and approaches

## 2017-02-11 NOTE — Progress Notes (Signed)
Subjective  Patient is presenting with the following illnesses  STD exposure Has history of herpes and would like checked to make sure does not have any other STDs.  No symptoms of dsysuria or discharge or sores or pain  HYPERTENSION Disease Monitoring  Home BP Monitoring (Severity) not checking Symptoms - Chest pain- no    Dyspnea- no Medications (Modifying factors) Compliance-  Misses once a week or so. Lightheadedness-  no  Edema- no Timing - continuous  Duration - years ROS - See HPI  PMH Lab Review   Potassium  Date Value Ref Range Status  09/24/2016 3.8 3.5 - 5.2 mmol/L Final   Sodium  Date Value Ref Range Status  09/24/2016 140 134 - 144 mmol/L Final   Creat  Date Value Ref Range Status  11/14/2015 0.97 0.60 - 1.35 mg/dL Final   Creatinine, Ser  Date Value Ref Range Status  09/24/2016 0.95 0.76 - 1.27 mg/dL Final       SMOKING Smokes 2 cigars a day.  Would sort of like to quit. Smokes more with stress.   Plans to stop cold [place] when he does.  No shortness of breath or cough  Chief Complaint noted Review of Symptoms - see HPI PMH - Smoking status noted.     Objective Vital Signs reviewed Psych:  Cognition and judgment appear intact. Alert, communicative  and cooperative with normal attention span and concentration. No apparent delusions, illusions, hallucinations     Assessments/Plans  Possible exposure to STD chck labs   Hypertension BP Readings from Last 3 Encounters:  02/11/17 128/90  09/24/16 (!) 152/88  08/06/16 (!) 148/90   At goal today.  Continue lifestyle changes   Tobacco abuse A bit precontemplative - Discussed reasons and approaches    See after visit summary for details of patient instuctions

## 2017-02-12 ENCOUNTER — Encounter: Payer: Self-pay | Admitting: Family Medicine

## 2017-02-12 LAB — HIV ANTIBODY (ROUTINE TESTING W REFLEX): HIV SCREEN 4TH GENERATION: NONREACTIVE

## 2017-02-12 LAB — RPR: RPR Ser Ql: NONREACTIVE

## 2017-02-19 ENCOUNTER — Other Ambulatory Visit: Payer: Self-pay | Admitting: Family Medicine

## 2017-02-19 MED FILL — HYDROCHLOROTHIAZIDE 25 MG T: 25 | 90 days supply | Qty: 90 | Fill #0

## 2017-02-19 MED FILL — LOSARTAN POTASSIUM 100 MG T: 100 | 90 days supply | Qty: 90 | Fill #1

## 2017-02-19 MED FILL — SILDENAFIL CITRATE 100 MG T: 100 | 30 days supply | Qty: 6 | Fill #1

## 2017-04-22 ENCOUNTER — Other Ambulatory Visit: Payer: Self-pay | Admitting: Family Medicine

## 2017-04-22 DIAGNOSIS — I10 Essential (primary) hypertension: Secondary | ICD-10-CM

## 2017-04-23 MED FILL — SILDENAFIL CITRATE 100 MG T: 100 | 30 days supply | Qty: 6 | Fill #2

## 2017-04-23 MED FILL — AMLODIPINE BESYLATE 10 MG T: 10 | 90 days supply | Qty: 90 | Fill #0

## 2017-07-09 ENCOUNTER — Other Ambulatory Visit: Payer: Self-pay

## 2017-07-09 ENCOUNTER — Emergency Department (HOSPITAL_COMMUNITY)
Admission: EM | Admit: 2017-07-09 | Discharge: 2017-07-09 | Disposition: A | Discharge status: Home / Self Care | Payer: 59 | Attending: Emergency Medicine | Admitting: Emergency Medicine

## 2017-07-09 ENCOUNTER — Encounter (HOSPITAL_COMMUNITY): Payer: Self-pay | Admitting: Emergency Medicine

## 2017-07-09 DIAGNOSIS — Z79899 Other long term (current) drug therapy: Secondary | ICD-10-CM | POA: Diagnosis not present

## 2017-07-09 DIAGNOSIS — S61412A Laceration without foreign body of left hand, initial encounter: Secondary | ICD-10-CM | POA: Insufficient documentation

## 2017-07-09 DIAGNOSIS — F17200 Nicotine dependence, unspecified, uncomplicated: Secondary | ICD-10-CM | POA: Insufficient documentation

## 2017-07-09 DIAGNOSIS — I10 Essential (primary) hypertension: Secondary | ICD-10-CM | POA: Insufficient documentation

## 2017-07-09 DIAGNOSIS — W268XXA Contact with other sharp object(s), not elsewhere classified, initial encounter: Secondary | ICD-10-CM | POA: Insufficient documentation

## 2017-07-09 DIAGNOSIS — Y929 Unspecified place or not applicable: Secondary | ICD-10-CM | POA: Diagnosis not present

## 2017-07-09 DIAGNOSIS — Y9389 Activity, other specified: Secondary | ICD-10-CM | POA: Insufficient documentation

## 2017-07-09 DIAGNOSIS — Y998 Other external cause status: Secondary | ICD-10-CM | POA: Insufficient documentation

## 2017-07-09 NOTE — ED Triage Notes (Addendum)
Patient was opening boxes with a razor blade when he accidentally cut his left wrist. 4cm laceration, bleeding controlled at this time, wrapped in gauze. Patient states last tetanus within last 5 years.

## 2017-07-09 NOTE — Discharge Instructions (Signed)
Return if any problems.

## 2017-07-09 NOTE — ED Provider Notes (Signed)
[facility] EMERGENCY DEPARTMENT Provider Note   CSN: [id number] Arrival date & time: 07/09/17  1414     History   Chief Complaint Chief Complaint  Patient presents with  . Laceration    HPI William Franco is a 42 y.o. male.  The history is provided by the patient. No language interpreter was used.  Laceration   The incident occurred less than 1 hour ago. The laceration is located on the left hand. The laceration is 1 cm in size. The laceration mechanism was a a razor. The pain is at a severity of 1/10. The pain is mild. The pain has been constant since onset. He reports no foreign bodies present. His tetanus status is UTD.  Pt cut his hand with a box cutter.  Past Medical History:  Diagnosis Date  . Hypertension   . Kidney stones   . 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 Possible exposure to STD 02/11/2017  . Frequent No-show for appointment 09/10/2016  . Gout 08/06/2016  . 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 Erectile dysfunction 04/19/2012  . Hypertension 12/18/2010  . Recurrent genital herpes 12/18/2010  . Tobacco abuse 12/18/2010  . Obesity 12/18/2010    Past Surgical History:  Procedure Laterality Date  . none         Home Medications    Prior to Admission medications   Medication Sig Start Date End Date Taking? Authorizing Provider  amLODipine (NORVASC) 10 MG tablet TAKE 1 TABLET (10 MG TOTAL) BY MOUTH DAILY. 04/22/17   Lind Covert, MD  hydrochlorothiazide (HYDRODIURIL) 25 MG tablet TAKE 1 TABLET (25 MG TOTAL) BY MOUTH DAILY. 02/19/17   Lind Covert, MD  losartan (COZAAR) 100 MG tablet TAKE 1 TABLET BY MOUTH DAILY. 04/22/17   Lind Covert, MD  valACYclovir (VALTREX) 500 MG tablet Take 1 tablet (500 mg total) by mouth daily. 08/06/16   Lind Covert, MD  VIAGRA 100 MG  tablet TAKE 1 TABLET BY MOUTH AS NEEDED FOR ERECTILE DYSFUNCTION. 05/06/16   Lind Covert, MD    Family History Family History  Problem Relation Age of Onset  . Hypertension Father   . Hypertension Mother     Social History Social History   Tobacco Use  . Smoking status: Current Every Day Smoker    Packs/day: 0.10  . Smokeless tobacco: Current User  Substance Use Topics  . Alcohol use: Yes    Comment: ocassionaly  . Drug use: No     Allergies   Bee venom   Review of Systems Review of Systems  All other systems reviewed and are negative.    Physical Exam Updated Vital Signs BP (!) 145/90 (BP Location: Right Arm)   Pulse 87   Temp 97.7 F (36.5 C) (Oral)   Resp 16   SpO2 100%   Physical Exam  Constitutional: He is oriented to person, place, and time. He appears well-developed and well-nourished.  HENT:  Head: Normocephalic.  Musculoskeletal: Normal range of motion.  1.5 cm laceration left hand  From nv and ns intact   Neurological: He is alert and oriented to person, place, and time.  Psychiatric: He has a normal mood and affect.  Nursing note reviewed.    ED Treatments / Results  Labs (all labs ordered are listed, but only abnormal results are displayed) Labs Reviewed - No data to  display  EKG  EKG Interpretation None       Radiology No results found.  Procedures .Marland KitchenLaceration Repair Date/Time: 07/09/2017 7:49 PM Performed by: Fransico Meadow, PA-C Authorized by: Fransico Meadow, PA-C   Consent:    Consent obtained:  Verbal   Consent given by:  Patient   Risks discussed:  Infection   Alternatives discussed:  No treatment Anesthesia (see MAR for exact dosages):    Anesthesia method:  None Laceration details:    Location:  Hand   Hand location:  L hand, dorsum   Length (cm):  1 Repair type:    Repair type:  Simple Treatment:    Area cleansed with:  Betadine   Irrigation solution:  Sterile saline   Visualized foreign  bodies/material removed: no   Skin repair:    Repair method:  Tissue adhesive Approximation:    Approximation:  Close   Vermilion border: well-aligned   Post-procedure details:    Patient tolerance of procedure:  Tolerated well, no immediate complications   (including critical care time)  Medications Ordered in ED Medications - No data to display   Initial Impression / Assessment and Plan / ED Course  I have reviewed the triage vital signs and the nursing notes.  Pertinent labs & imaging results that were available during my care of the patient were reviewed by me and considered in my medical decision making (see chart for details).       Final Clinical Impressions(s) / ED Diagnoses   Final diagnoses:  Laceration of left hand without foreign body, initial encounter    ED Discharge Orders    None    An After Visit Summary was printed and given to the patient.    Fransico Meadow, [place] 07/09/17 1950    Charlesetta Shanks, MD 07/10/17 1309

## 2017-07-16 MED FILL — LOSARTAN POTASSIUM 100 MG T: 100 | 90 days supply | Qty: 90 | Fill #0

## 2017-07-16 MED FILL — AMLODIPINE BESYLATE 10 MG T: 10 | 90 days supply | Qty: 90 | Fill #1

## 2017-07-16 MED FILL — HYDROCHLOROTHIAZIDE 25 MG T: 25 | 90 days supply | Qty: 90 | Fill #1

## 2017-08-16 ENCOUNTER — Ambulatory Visit (HOSPITAL_COMMUNITY)
Admission: EM | Admit: 2017-08-16 | Discharge: 2017-08-16 | Disposition: A | Discharge status: Home / Self Care | Payer: 59 | Attending: Urgent Care | Admitting: Urgent Care

## 2017-08-16 ENCOUNTER — Encounter (HOSPITAL_COMMUNITY): Payer: Self-pay | Admitting: Emergency Medicine

## 2017-08-16 ENCOUNTER — Other Ambulatory Visit: Payer: Self-pay

## 2017-08-16 DIAGNOSIS — Z720 Tobacco use: Secondary | ICD-10-CM

## 2017-08-16 DIAGNOSIS — R05 Cough: Secondary | ICD-10-CM | POA: Diagnosis not present

## 2017-08-16 DIAGNOSIS — R0981 Nasal congestion: Secondary | ICD-10-CM | POA: Diagnosis not present

## 2017-08-16 DIAGNOSIS — R059 Cough, unspecified: Secondary | ICD-10-CM

## 2017-08-16 DIAGNOSIS — F17200 Nicotine dependence, unspecified, uncomplicated: Secondary | ICD-10-CM

## 2017-08-16 DIAGNOSIS — J40 Bronchitis, not specified as acute or chronic: Secondary | ICD-10-CM

## 2017-08-16 DIAGNOSIS — R0989 Other specified symptoms and signs involving the circulatory and respiratory systems: Secondary | ICD-10-CM

## 2017-08-16 MED ORDER — CETIRIZINE HCL 10 MG PO TABS: 10.0000 mg | ORAL_TABLET | Freq: Every day | ORAL | 11 refills | Status: DC

## 2017-08-16 MED ORDER — AZITHROMYCIN 250 MG PO TABS: 250.0000 mg | ORAL_TABLET | Freq: Every day | ORAL | 0 refills | Status: DC

## 2017-08-16 MED ORDER — BENZONATATE 100 MG PO CAPS: 100.0000 mg | ORAL_CAPSULE | Freq: Three times a day (TID) | ORAL | 0 refills | Status: DC | PRN

## 2017-08-16 MED ORDER — PREDNISONE 20 MG PO TABS: ORAL_TABLET | ORAL | 0 refills | Status: DC

## 2017-08-16 NOTE — ED Provider Notes (Signed)
  MRN: [id number] DOB: 25-Dec-1975  Subjective:   William Franco is a 42 y.o. male presenting for 2-week history of persistent productive cough worsening in the past week, now having chest congestion, shortness of breath and wheezing.  Patient is also felt subjective fever, fatigue and malaise.  He has a history of allergies but does not take anything for this.  Patient is an active smoker but has not smoked since he is felt sick in the past week.  He has tried over-the-counter cough syrups.  No current facility-administered medications for this encounter.   Current Outpatient Medications:  .  amLODipine (NORVASC) 10 MG tablet, TAKE 1 TABLET (10 MG TOTAL) BY MOUTH DAILY., Disp: 90 tablet, Rfl: 2 .  hydrochlorothiazide (HYDRODIURIL) 25 MG tablet, TAKE 1 TABLET (25 MG TOTAL) BY MOUTH DAILY., Disp: 90 tablet, Rfl: 2 .  losartan (COZAAR) 100 MG tablet, TAKE 1 TABLET BY MOUTH DAILY., Disp: 90 tablet, Rfl: 2 .  valACYclovir (VALTREX) 500 MG tablet, Take 1 tablet (500 mg total) by mouth daily., Disp: 90 tablet, Rfl: 0 .  VIAGRA 100 MG tablet, TAKE 1 TABLET BY MOUTH AS NEEDED FOR ERECTILE DYSFUNCTION., Disp: 5 tablet, Rfl: 3    Allergies  Allergen Reactions  . Bee Venom     Past Medical History:  Diagnosis Date  . Hypertension   . Kidney stones   . 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 BACK SURGERY    . none      Objective:   Vitals: BP (!) 156/92   Temp 98.3 F (36.8 C) (Oral)   SpO2 100%   Physical Exam  Constitutional: He is oriented to person, place, and time. He appears well-developed and well-nourished.  HENT:  Mouth/Throat: No oropharyngeal exudate.  Throat with significant postnasal drainage.  Eyes: Right eye exhibits no discharge. Left eye exhibits no discharge. No scleral icterus.  Cardiovascular: Normal rate, regular rhythm and intact distal pulses. Exam reveals no gallop and  no friction rub.  No murmur heard. Pulmonary/Chest: Effort normal. No respiratory distress. He has no wheezes. He has no rales.  Rhonchorous lung sounds in bibasilar fields, diminished lung sounds throughout.  Neurological: He is alert and oriented to person, place, and time.  Skin: Skin is warm and dry.  Psychiatric: He has a normal mood and affect.   Assessment and Plan :   Bronchitis  Cough  Chest congestion  Tobacco use disorder  We will start azithromycin, short steroid course.  Cough suppression with Tessalon.  Patient is to start allergy management with Zyrtec.  Hydrate well and refrain from smoking.  Follow-up as needed   Jaynee Eagles, Hershal Coria 08/16/17 1651

## 2017-08-16 NOTE — ED Triage Notes (Signed)
States has been having productive cough with "yellow" mucus onset 3 weeks

## 2017-08-16 NOTE — Discharge Instructions (Signed)
Hydrate well with at least 2 liters (1 gallon) of water daily. For sore throat try using a honey-based tea. Use 3 teaspoons of honey with juice squeezed from half lemon. Place shaved pieces of ginger into 1/2-1 cup of water and warm over stove top. Then mix the ingredients and repeat every 4 hours as needed.  °

## 2017-08-24 ENCOUNTER — Encounter (HOSPITAL_COMMUNITY): Payer: Self-pay | Admitting: *Deleted

## 2017-08-24 ENCOUNTER — Other Ambulatory Visit: Payer: Self-pay

## 2017-08-24 ENCOUNTER — Emergency Department (HOSPITAL_COMMUNITY)
Admission: EM | Admit: 2017-08-24 | Discharge: 2017-08-25 | Disposition: A | Discharge status: Home / Self Care | Payer: 59 | Attending: Emergency Medicine | Admitting: Emergency Medicine

## 2017-08-24 ENCOUNTER — Emergency Department (HOSPITAL_COMMUNITY): Payer: 59

## 2017-08-24 DIAGNOSIS — J9801 Acute bronchospasm: Secondary | ICD-10-CM | POA: Insufficient documentation

## 2017-08-24 DIAGNOSIS — R0602 Shortness of breath: Secondary | ICD-10-CM | POA: Diagnosis not present

## 2017-08-24 DIAGNOSIS — F17210 Nicotine dependence, cigarettes, uncomplicated: Secondary | ICD-10-CM | POA: Insufficient documentation

## 2017-08-24 DIAGNOSIS — Z79899 Other long term (current) drug therapy: Secondary | ICD-10-CM | POA: Insufficient documentation

## 2017-08-24 DIAGNOSIS — R062 Wheezing: Secondary | ICD-10-CM | POA: Diagnosis not present

## 2017-08-24 DIAGNOSIS — Z720 Tobacco use: Secondary | ICD-10-CM

## 2017-08-24 DIAGNOSIS — I10 Essential (primary) hypertension: Secondary | ICD-10-CM | POA: Diagnosis not present

## 2017-08-24 MED ORDER — ALBUTEROL SULFATE (2.5 MG/3ML) 0.083% IN NEBU
INHALATION_SOLUTION | RESPIRATORY_TRACT | Status: AC
  Filled 2017-08-24: qty 6

## 2017-08-24 MED ORDER — ALBUTEROL SULFATE (2.5 MG/3ML) 0.083% IN NEBU
5.0000 mg | INHALATION_SOLUTION | Freq: Once | RESPIRATORY_TRACT | Status: AC
  Administered 2017-08-24: 5 mg via RESPIRATORY_TRACT

## 2017-08-24 NOTE — ED Triage Notes (Signed)
Pt was seen at [facility] a week ago for SOB, diagnosed with bronchitis, started on antibiotics and steroids. Reports feeling better but finished his steroids on Friday, started having increased sob on Saturday. Pt has wheezing, able to speak in short sentences. Ambulating exarcerbates wheezing. Denies pain

## 2017-08-25 DIAGNOSIS — F17210 Nicotine dependence, cigarettes, uncomplicated: Secondary | ICD-10-CM | POA: Diagnosis not present

## 2017-08-25 DIAGNOSIS — Z79899 Other long term (current) drug therapy: Secondary | ICD-10-CM | POA: Diagnosis not present

## 2017-08-25 DIAGNOSIS — I10 Essential (primary) hypertension: Secondary | ICD-10-CM | POA: Diagnosis not present

## 2017-08-25 DIAGNOSIS — J9801 Acute bronchospasm: Secondary | ICD-10-CM | POA: Diagnosis not present

## 2017-08-25 LAB — BRAIN NATRIURETIC PEPTIDE: B NATRIURETIC PEPTIDE 5: 20.8 pg/mL (ref 0.0–100.0)

## 2017-08-25 LAB — CBC WITH DIFFERENTIAL/PLATELET
BASOS ABS: 0 10*3/uL (ref 0.0–0.1)
BASOS PCT: 1 %
EOS ABS: 0.5 10*3/uL (ref 0.0–0.7)
EOS PCT: 6 %
HCT: 38.9 % — ABNORMAL LOW (ref 39.0–52.0)
Hemoglobin: 12.7 g/dL — ABNORMAL LOW (ref 13.0–17.0)
Lymphocytes Relative: 35 %
Lymphs Abs: 2.8 10*3/uL (ref 0.7–4.0)
MCH: 23.4 pg — ABNORMAL LOW (ref 26.0–34.0)
MCHC: 32.6 g/dL (ref 30.0–36.0)
MCV: 71.6 fL — AB (ref 78.0–100.0)
Monocytes Absolute: 0.6 10*3/uL (ref 0.1–1.0)
Monocytes Relative: 8 %
Neutro Abs: 4.1 10*3/uL (ref 1.7–7.7)
Neutrophils Relative %: 50 %
PLATELETS: 285 10*3/uL (ref 150–400)
RBC: 5.43 MIL/uL (ref 4.22–5.81)
RDW: 15.5 % (ref 11.5–15.5)
WBC: 8.1 10*3/uL (ref 4.0–10.5)

## 2017-08-25 LAB — BASIC METABOLIC PANEL
Anion gap: 11 (ref 5–15)
BUN: 12 mg/dL (ref 6–20)
CALCIUM: 9 mg/dL (ref 8.9–10.3)
CO2: 27 mmol/L (ref 22–32)
CREATININE: 1 mg/dL (ref 0.61–1.24)
Chloride: 99 mmol/L — ABNORMAL LOW (ref 101–111)
GFR calc Af Amer: >60 mL/min (ref >60)
GLUCOSE: 131 mg/dL — AB (ref 65–99)
Potassium: 3.3 mmol/L — ABNORMAL LOW (ref 3.5–5.1)
SODIUM: 137 mmol/L (ref 135–145)

## 2017-08-25 LAB — I-STAT TROPONIN, ED: TROPONIN I, POC: 0.01 ng/mL (ref 0.00–0.08)

## 2017-08-25 MED ORDER — ALBUTEROL (5 MG/ML) CONTINUOUS INHALATION SOLN
10.0000 mg/h | INHALATION_SOLUTION | RESPIRATORY_TRACT | Status: AC
  Administered 2017-08-25: 10 mg/h via RESPIRATORY_TRACT
  Filled 2017-08-25: qty 20

## 2017-08-25 MED ORDER — IPRATROPIUM BROMIDE 0.02 % IN SOLN
1.0000 mg | Freq: Once | RESPIRATORY_TRACT | Status: AC
Start: 2017-08-25 — End: 2017-08-25
  Administered 2017-08-25: 0.5 mg via RESPIRATORY_TRACT
  Filled 2017-08-25: qty 5

## 2017-08-25 MED ORDER — MAGNESIUM SULFATE 2 GM/50ML IV SOLN
2.0000 g | Freq: Once | INTRAVENOUS | Status: AC
  Administered 2017-08-25: 2 g via INTRAVENOUS
  Filled 2017-08-25: qty 50

## 2017-08-25 MED ORDER — PREDNISONE 10 MG (21) PO TBPK: ORAL_TABLET | Freq: Every day | ORAL | 0 refills | Status: DC

## 2017-08-25 MED ORDER — ALBUTEROL SULFATE (2.5 MG/3ML) 0.083% IN NEBU: 5.0000 mg | INHALATION_SOLUTION | Freq: Once | RESPIRATORY_TRACT | Status: DC

## 2017-08-25 MED ORDER — METHYLPREDNISOLONE SODIUM SUCC 125 MG IJ SOLR
125.0000 mg | Freq: Once | INTRAMUSCULAR | Status: AC
  Administered 2017-08-25: 125 mg via INTRAVENOUS
  Filled 2017-08-25: qty 2

## 2017-08-25 MED ORDER — DEXTROMETHORPHAN-GUAIFENESIN 15-400 MG PO TABS: 1.0000 | ORAL_TABLET | ORAL | 0 refills | Status: DC

## 2017-08-25 MED ORDER — AEROCHAMBER PLUS W/MASK MISC
1.0000 | Freq: Once | Status: AC
  Administered 2017-08-25: 1
  Filled 2017-08-25: qty 1

## 2017-08-25 MED ORDER — ALBUTEROL SULFATE HFA 108 (90 BASE) MCG/ACT IN AERS
2.0000 | INHALATION_SPRAY | Freq: Once | RESPIRATORY_TRACT | Status: AC
  Administered 2017-08-25: 2 via RESPIRATORY_TRACT
  Filled 2017-08-25: qty 6.7

## 2017-08-25 MED ORDER — AEROCHAMBER PLUS FLO-VU LARGE MISC
Status: AC
  Filled 2017-08-25: qty 1

## 2017-08-25 MED FILL — predniSONE 10 MG TABS: 10 | 12 days supply | Qty: 42 | Fill #0

## 2017-08-25 NOTE — Discharge Instructions (Signed)
Get help right away if: You seem to be getting worse and are unresponsive to treatment during an asthma attack. You are short of breath even at rest. You get short of breath when doing very little physical activity. You have difficulty eating, drinking, or talking due to asthma symptoms. You develop chest pain. You develop a fast heartbeat. You have a bluish color to your lips or fingernails. You are light-headed, dizzy, or faint. Your peak flow is less than 50% of your personal best.

## 2017-08-25 NOTE — ED Provider Notes (Signed)
[place] EMERGENCY DEPARTMENT Provider Note   CSN: [id number] Arrival date & time: 08/24/17  2323     History   Chief Complaint Chief Complaint  Patient presents with  . Shortness of Breath    HPI William Franco is a very pleasant 42 y.o. male who presents to the emergency department with chief complaint of shortness of breath.  Patient states that he developed an upper respiratory infection/allergies about a month ago.  His symptoms improved but he has had a persistent cough that he cannot read himself off.  He has had associated wheezing, chest tightness and paroxysms of severe cough that exacerbate wheezing.  His shortness of breath and wheezing are worsened with exertion.  Patient states that for instance yesterday he was walking into the hospital for work and had to stop twice coming from the parking deck to catch his breath.  He denies leg swelling, orthopnea, or PND.  He has not had any chest pain.  The patient does have a history of asthma in childhood but no hospitalizations or intubations.  He also was seen at the urgent care on 08/16/2017, started on a short course of steroids and an antibiotic and had significant improvement in his symptoms.  Patient states that he stopped his medications this past Friday, he was feeling well Saturday had full-blown return.  The patient denies fevers or chills.  He does not have an inhaler at home.  HPI  Past Medical History:  Diagnosis Date  . Hypertension   . Kidney stones   . 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 Possible exposure to STD 02/11/2017  . Frequent No-show for appointment 09/10/2016  . Gout 08/06/2016  . 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 Erectile dysfunction 04/19/2012  . Hypertension 12/18/2010  . Recurrent genital herpes 12/18/2010  . Tobacco abuse 12/18/2010  .  Obesity 12/18/2010    Past Surgical History:  Procedure Laterality Date  . BACK SURGERY    . none          Home Medications    Prior to Admission medications   Medication Sig Start Date End Date Taking? Authorizing Provider  amLODipine (NORVASC) 10 MG tablet TAKE 1 TABLET (10 MG TOTAL) BY MOUTH DAILY. 04/22/17  Yes Chambliss, Jeb Levering, MD  benzonatate (TESSALON) 100 MG capsule Take 1-2 capsules (100-200 mg total) by mouth 3 (three) times daily as needed. 08/16/17  Yes Jaynee Eagles, PA-C  cetirizine (ZYRTEC ALLERGY) 10 MG tablet Take 1 tablet (10 mg total) by mouth daily. Patient taking differently: Take 10 mg by mouth daily as needed for allergies.  08/16/17  Yes Jaynee Eagles, PA-C  hydrochlorothiazide (HYDRODIURIL) 25 MG tablet TAKE 1 TABLET (25 MG TOTAL) BY MOUTH DAILY. 02/19/17  Yes Chambliss, Jeb Levering, MD  losartan (COZAAR) 100 MG tablet TAKE 1 TABLET BY MOUTH DAILY. 04/22/17  Yes Chambliss, Jeb Levering, MD  valACYclovir (VALTREX) 500 MG tablet Take 1 tablet (500 mg total) by mouth daily. Patient taking differently: Take 500 mg by mouth daily as needed.  08/06/16  Yes Chambliss, Jeb Levering, MD  VIAGRA 100 MG tablet TAKE 1 TABLET BY MOUTH AS NEEDED FOR ERECTILE DYSFUNCTION. 05/06/16  Yes Chambliss, Jeb Levering, MD  Dextromethorphan-guaiFENesin 15-400 MG TABS Take 1 tablet by mouth every 4 (four) hours. 08/25/17   Townes Fuhs, PA-C  predniSONE (STERAPRED UNI-PAK 21 TAB) 10 MG (21) TBPK  tablet Take by mouth daily. Take 6 tabs by mouth daily  for 2 days, then 5 tabs for 2 days, then 4 tabs for 2 days, then 3 tabs for 2 days, 2 tabs for 2 days, then 1 tab by mouth daily for 2 days 08/25/17   Margarita Mail, PA-C    Family History Family History  Problem Relation Age of Onset  . Hypertension Father   . Hypertension Mother     Social History Social History   Tobacco Use  . Smoking status: Current Every Day Smoker    Packs/day: 0.10  . Smokeless tobacco: Current User  Substance  Use Topics  . Alcohol use: Yes    Comment: ocassionaly  . Drug use: No     Allergies   Bee venom   Review of Systems Review of Systems  Ten systems reviewed and are negative for acute change, except as noted in the HPI.   Physical Exam Updated Vital Signs BP 132/76 (BP Location: Left Arm)   Pulse 91   Temp (!) 97.5 F (36.4 C) (Oral)   Resp 18   SpO2 98%   Physical Exam  Constitutional: He appears well-developed and well-nourished. No distress.  HENT:  Head: Normocephalic and atraumatic.  Eyes: Pupils are equal, round, and reactive to light. Conjunctivae and EOM are normal. No scleral icterus.  Neck: Normal range of motion. Neck supple. No JVD present.  Cardiovascular: Normal rate, regular rhythm and normal heart sounds.  Pulmonary/Chest: Effort normal. No respiratory distress. He has wheezes in the right upper field, the right middle field, the right lower field, the left upper field, the left middle field and the left lower field.  speakingin full sentences, but breathing is somewhat labored. Wheezing is audible.  Abdominal: Soft. There is no tenderness.  Musculoskeletal: He exhibits no edema.       Right lower leg: He exhibits no edema.       Left lower leg: He exhibits no edema.  Neurological: He is alert.  Skin: Skin is warm and dry. He is not diaphoretic.  Psychiatric: His behavior is normal.  Nursing note and vitals reviewed.    ED Treatments / Results  Labs (all labs ordered are listed, but only abnormal results are displayed) Labs Reviewed  BASIC METABOLIC PANEL - Abnormal; Notable for the following components:      Result Value   Potassium 3.3 (*)    Chloride 99 (*)    Glucose, Bld 131 (*)    All other components within normal limits  CBC WITH DIFFERENTIAL/PLATELET - Abnormal; Notable for the following components:   Hemoglobin 12.7 (*)    HCT 38.9 (*)    MCV 71.6 (*)    MCH 23.4 (*)    All other components within normal limits  BRAIN NATRIURETIC  PEPTIDE  I-STAT TROPONIN, ED    EKG EKG Interpretation  Date/Time:  Tuesday August 25 2017 01:02:40 EDT Ventricular Rate:  84 PR Interval:  154 QRS Duration: 88 QT Interval:  408 QTC Calculation: 482 R Axis:   51 Text Interpretation:  Normal sinus rhythm Nonspecific T wave abnormality Prolonged QT Abnormal ECG Confirmed by Ripley Fraise [phone]) on 08/25/2017 6:32:09 AM   Radiology Dg Chest 2 View  Result Date: 08/25/2017 CLINICAL DATA:  Short of breath EXAM: CHEST - 2 VIEW COMPARISON:  None. FINDINGS: The heart size and mediastinal contours are within normal limits. Both lungs are clear. The visualized skeletal structures are unremarkable. IMPRESSION: No active cardiopulmonary disease. Electronically Signed  By: Donavan Foil M.D.   On: 08/25/2017 00:02    Procedures Procedures (including critical care time)  Medications Ordered in ED Medications  albuterol (PROVENTIL) (2.5 MG/3ML) 0.083% nebulizer solution (has no administration in time range)  albuterol (PROVENTIL,VENTOLIN) solution continuous neb (10 mg/hr Nebulization New Bag/Given 08/25/17 0651)  albuterol (PROVENTIL) (2.5 MG/3ML) 0.083% nebulizer solution 5 mg (has no administration in time range)  albuterol (PROVENTIL HFA;VENTOLIN HFA) 108 (90 Base) MCG/ACT inhaler 2 puff (has no administration in time range)  aerochamber plus with mask device 1 each (has no administration in time range)  albuterol (PROVENTIL) (2.5 MG/3ML) 0.083% nebulizer solution 5 mg (5 mg Nebulization Given 08/24/17 2332)  methylPREDNISolone sodium succinate (SOLU-MEDROL) 125 mg/2 mL injection 125 mg (125 mg Intravenous Given 08/25/17 0924)  magnesium sulfate IVPB 2 g 50 mL (0 g Intravenous Stopped 08/25/17 1200)  ipratropium (ATROVENT) nebulizer solution 1 mg (0.5 mg Nebulization Given 08/25/17 0651)     Initial Impression / Assessment and Plan / ED Course  I have reviewed the triage vital signs and the nursing notes.  Pertinent labs & imaging  results that were available during my care of the patient were reviewed by me and considered in my medical decision making (see chart for details).  Clinical Course as of Aug 26 1223  Tue Aug 25, 2017  4193 EKG reviewed  shows Prolonged QT no signs of arrhythmia or ischemia   [AH]  0648 CXR reviewed no evidence of CAP  DG Chest 2 View [AH]    Clinical Course User Index [AH] Margarita Mail, PA-C   Wheezing and shortness of breath.  Labs and imaging reviewed.  He received 4 treatments here in the ER including an hour-long neb treatment, magnesium and Solu-Medrol.  Patient still continues to wheeze but feels greatly improved and is able to ambulate without dropping oxygen saturations. The patient was counseled on the dangers of tobacco use, and was advised to quit.  Reviewed strategies to maximize success, including removing cigarettes and smoking materials from environment, stress management, substitution of other forms of reinforcement, support of family/friends and written materials.  Patient ambulated in ED with O2 saturations maintained >90, no current signs of respiratory distress. Lung exam improved after nebulizer treatment.. Pt has been instructed to continue using prescribed medications and to speak with PCP about today's exacerbation.  .    Final Clinical Impressions(s) / ED Diagnoses   Final diagnoses:  Bilateral wheezing  Post-infection bronchospasm  Tobacco abuse    ED Discharge Orders        Ordered    predniSONE (STERAPRED UNI-PAK 21 TAB) 10 MG (21) TBPK tablet  Daily     08/25/17 1221    Dextromethorphan-guaiFENesin 15-400 MG TABS  Every 4 hours     08/25/17 1221       Margarita Mail, PA-C 08/25/17 1225    Ripley Fraise, MD 08/27/17 2306

## 2017-08-25 NOTE — ED Notes (Addendum)
Ambulated in hallway with pulse ox - sats maintained at 98-100% on room air. Did not get short of breath- would like to go home.  Scattered expiratory wheezes ,

## 2017-08-28 ENCOUNTER — Ambulatory Visit (INDEPENDENT_AMBULATORY_CARE_PROVIDER_SITE_OTHER): Payer: 59 | Admitting: Student

## 2017-08-28 ENCOUNTER — Encounter: Payer: Self-pay | Admitting: Student

## 2017-08-28 ENCOUNTER — Other Ambulatory Visit: Payer: Self-pay

## 2017-08-28 VITALS — BP 122/84 | HR 80 | Temp 98.3°F | Ht >= 80 in | Wt 351.6 lb

## 2017-08-28 DIAGNOSIS — J209 Acute bronchitis, unspecified: Secondary | ICD-10-CM

## 2017-08-28 NOTE — Patient Instructions (Signed)
It was great seeing you today! I am glad you felt better.  Your lung exam is normal today.  Continue taking your prednisone until you complete the whole course.  If we did any lab work today, and the results require attention, either me or my nurse will get in touch with you. If everything is normal, you will get a letter in mail and a message via . If you don't hear from [place] in two weeks, please give [place] a call. Otherwise, we look forward to seeing you again at your next visit. If you have any questions or concerns before then, please call the clinic at [phone].  Please bring all your medications to every doctors visit  Sign up for My Chart to have easy access to your labs results, and communication with your Primary care physician.    Please check-out at the front desk before leaving the clinic.    Take Care,   Dr. Cyndia Skeeters

## 2017-08-28 NOTE — Progress Notes (Signed)
  Subjective:    William Franco is a 42 y.o. old male here for ED follow-up "bronchitis"  HPI "Bronchitis": About a week ago, patient went to urgent care due to wheezing, cough, shortness of breath and "mild fever".  He was diagnosed with "bronchitis" and was treated with prednisone and antibiotics.  He briefly felt better but wheezing and shortness of breath return and went to ED about 3 days ago.  He was given nebulizer treatments and then discharged home to complete prescribed medications from urgent care, and follow-up with PCP.   Patient states that he has been sick on and off for almost two moths. He initially thought is was due to allergy and viral cold since he had runny nose and congestion.  Today he feels "good". He is already back to work. He is still on prednisone taper and cough syrup.  Cough has improved tremendously.  He denies shortness of breath or chest pain.  He denies further fever.  He denies sore throat.   He reports history of asthma as a child.  He reports smoking Black and mild.  He has not smoked in 2 weeks.  He is planning to quit.  PMH/Problem List: has Hypertension; Recurrent genital herpes; Tobacco abuse; Obesity; Erectile dysfunction; Spinal stenosis, lumbar region, with neurogenic claudication; Gout; Frequent No-show for appointment; and Possible exposure to STD on their problem list.   has a past medical history of Hypertension, Kidney stones, and Spinal stenosis, lumbar region, with neurogenic claudication (10/12/2014).  FH:  Family History  Problem Relation Age of Onset  . Hypertension Father   . Hypertension Mother     [facility] Social History   Tobacco Use  . Smoking status: Current Every Day Smoker    Packs/day: 0.10  . Smokeless tobacco: Current User  Substance Use Topics  . Alcohol use: Yes    Comment: ocassionaly  . Drug use: No    Review of Systems Review of systems negative except for pertinent positives and negatives in history of present illness  above.     Objective:     Vitals:   08/28/17 0909  BP: 122/84  Pulse: 80  Temp: 98.3 F (36.8 C)  TempSrc: Oral  SpO2: 97%  Weight: (!) 351 lb 9.6 oz (159.5 kg)  Height: 6\' 8"  (2.032 m)   Body mass index is 38.63 kg/m.  Physical Exam  GEN: appears well & comfortable. No apparent distress. Head: normocephalic and atraumatic  Eyes: conjunctiva without injection. Sclera anicteric. Oropharynx: MMM. No erythema. No exudation or petechiae.  Uvula midline HEM: negative for cervical or periauricular lymphadenopathies CVS: RRR, nl s1 & s2, no murmurs, no edema RESP: no IWOB, good air movement bilaterally, CTAB GI: BS present & normal, soft, NTND SKIN: no apparent skin lesion ENDO: negative thyromegally NEURO: alert and oiented appropriately, no gross deficits   PSYCH: euthymic mood with congruent affect    Assessment and Plan:  1. Acute bronchitis, unspecified organism: Resolving.  Patient reports feeling "good".  He says his symptoms have almost gone except for intermittent cough.  Oropharyngeal and cardiopulmonary exam within normal limits.  He has no respiratory distress.  He is a still on his prednisone taper.  I recommended finishing that.  He has albuterol at home.  I encouraged him to quit smoking.  He has smoked in 2 weeks.  Return if symptoms worsen or fail to improve.  Mercy Riding, MD 08/28/17 Pager: [phone]

## 2017-09-23 ENCOUNTER — Other Ambulatory Visit: Payer: Self-pay | Admitting: Family Medicine

## 2017-10-13 ENCOUNTER — Encounter: Payer: Self-pay | Admitting: Family Medicine

## 2017-10-13 ENCOUNTER — Ambulatory Visit (INDEPENDENT_AMBULATORY_CARE_PROVIDER_SITE_OTHER): Payer: 59 | Admitting: Family Medicine

## 2017-10-13 ENCOUNTER — Other Ambulatory Visit: Payer: Self-pay

## 2017-10-13 DIAGNOSIS — I10 Essential (primary) hypertension: Secondary | ICD-10-CM | POA: Diagnosis not present

## 2017-10-13 MED ORDER — SILDENAFIL CITRATE 100 MG PO TABS: ORAL_TABLET | ORAL | 6 refills | Status: DC

## 2017-10-13 NOTE — Patient Instructions (Addendum)
Good to see you today!  Thanks for coming in.  Your blood pressure and labs look good  Continue to work on your weight.   Aim to loose 1 lb per week - restart the shakes  Monitor your blood pressure every so often

## 2017-10-13 NOTE — Progress Notes (Signed)
Subjective  William Franco is a 42 y.o. male is presenting with the following  Here for checkup.  Feels well no complaints  Patient reports no  vision/ hearing changes,anorexia, weight change, fever ,adenopathy, persistant / recurrent hoarseness, swallowing issues, chest pain, edema,persistant / recurrent cough, hemoptysis, dyspnea(rest, exertional, paroxysmal nocturnal), gastrointestinal  bleeding (melena, rectal bleeding), abdominal pain, excessive heart burn, GU symptoms(dysuria, hematuria, pyuria, voiding/incontinence  Issues) syncope, focal weakness, severe memory loss, concerning skin lesions, depression, anxiety, abnormal bruising/bleeding, major joint swelling.    Chief Complaint noted Review of Symptoms - see HPI PMH - Smoking status noted.    Objective Vital Signs reviewed BP 110/78   Pulse 71   Temp 98.2 F (36.8 C) (Oral)   Ht 6\' 8"  (2.032 m)   Wt (!) 362 lb 6.4 oz (164.4 kg)   SpO2 99%   BMI 39.81 kg/m  Alert nad Heart - Regular rate and rhythm.  No murmurs, gallops or rubs.    Lungs:  Normal respiratory effort, chest expands symmetrically. Lungs are clear to auscultation, no crackles or wheezes. Abdomen: soft and non-tender without masses, organomegaly or hernias noted.  No guarding or rebound Extremities:  No cyanosis, edema, or deformity noted with good range of motion of all major joints.   Skin:  Intact without suspicious lesions or rashes  Assessments/Plans  Normal exam Discussed exercise and weight control  See after visit summary for details of patient instuctions  No problem-specific Assessment & Plan notes found for this encounter.

## 2017-10-13 NOTE — Assessment & Plan Note (Signed)
BP Readings from Last 3 Encounters:  10/13/17 110/78  08/28/17 122/84  08/25/17 132/76   Well controlled on current regimen

## 2017-11-12 MED FILL — AMLODIPINE BESYLATE 10 MG T: 10 | 90 days supply | Qty: 90 | Fill #2

## 2017-11-12 MED FILL — HYDROCHLOROTHIAZIDE 25 MG T: 25 | 90 days supply | Qty: 90 | Fill #2

## 2017-11-12 MED FILL — LOSARTAN POTASSIUM 100 MG T: 100 | 90 days supply | Qty: 90 | Fill #1

## 2018-01-28 MED FILL — SILDENAFIL CITRATE 100 MG T: 100 | 25 days supply | Qty: 5 | Fill #0

## 2018-02-05 ENCOUNTER — Ambulatory Visit (INDEPENDENT_AMBULATORY_CARE_PROVIDER_SITE_OTHER): Payer: 59 | Admitting: Family Medicine

## 2018-02-05 VITALS — BP 135/80 | HR 70 | Temp 98.3°F | Ht >= 80 in | Wt 359.8 lb

## 2018-02-05 DIAGNOSIS — A084 Viral intestinal infection, unspecified: Secondary | ICD-10-CM

## 2018-02-05 NOTE — Progress Notes (Signed)
   Subjective:   Patient ID: William Franco    DOB: Dec 28, 1975, 42 y.o. male   MRN: [id number]  CC: abdominal pain, diarrhea   HPI: William Franco is a 42 y.o. male who presents to clinic today with the following issue.  Diarrhea Constipated Monday morning, followed by diarrhea all day Monday and Tuesday.   Recalls eating baked beans on Monday which may have been the reason.  Around Wednesday his symptoms subsided, however on Wednesday he noticed some blood in his stool.  Blood was mixed in mucous and also blood on toilet paper with wiping.   No one at home or work with similar symptoms of diarrhea.   Symptoms are overall improving but he wants to make sure he is fine.   Feels more gassy and bloated .  Denies relation of this to dairy consumption.  Denies abdominal pain, fevers, chills.  Some nausea but has not vomited.  Appetite is decreased but he has been drinking fluids ie water, Gatorade.   ROS: See HPI for pertinent ROS.  Social: Is a current everyday smoker. Medications reviewed. Objective:   BP 135/80 (BP Location: Left Arm, Patient Position: Sitting, Cuff Size: Large)   Pulse 70   Temp 98.3 F (36.8 C) (Oral)   Ht 6\' 8"  (2.032 m)   Wt (!) 359 lb 12.8 oz (163.2 kg)   SpO2 99%   BMI 39.53 kg/m  Vitals and nursing note reviewed.  General: 42 year old male, NAD HEENT: NCAT, EOMI, MMM, oropharynx clear Neck: supple CV: RRR no MRG  Lungs: CTAB, normal effort  Abdomen: soft, NT ND, +bs  Skin: warm, dry, no rash  Assessment & Plan:   Viral gastroenteritis Patient reports episode of blood in his stool.  He has a h/o hemorrhoids which he attributes this to.  No family history of colon cancer.  He reports his symptoms are overall improving and does not want any nausea medication.  We will continue to monitor for now, likely 2/2 viral gastro. If any more bleeding noted, he is to call PCP and schedule an appt. May consider colonoscopy at that time if warranted.  Lovenia Kim, MD [place] [facility] PGY-3

## 2018-02-05 NOTE — Patient Instructions (Signed)
It was nice meeting you today.  You were seen in clinic for diarrhea which is most likely related to a viral gastroenteritis.  I am including some information below that you may find helpful.  As we discussed, the most important thing is staying well-hydrated and drinking plenty of water and Gatorade.  I am glad your symptoms are subsiding and this usually resolves over the course of 7 to 10 days.  It is likely that the bleeding is due to irritation or hemorrhoids.  If you notice any more blood in your stool, I would strongly recommend making an appointment with your PCP for further evaluation.  Please call clinic if you have any questions.  Lovenia Kim MD    Viral Gastroenteritis, Adult Viral gastroenteritis is also known as the stomach flu. This condition is caused by certain germs (viruses). These germs can be passed from person to person very easily (are very contagious). This condition can cause sudden watery poop (diarrhea), fever, and throwing up (vomiting). Having watery poop and throwing up can make you feel weak and cause you to get dehydrated. Dehydration can make you tired and thirsty, make you have a dry mouth, and make it so you pee (urinate) less often. Older adults and people with other diseases or a weak defense system (immune system) are at higher risk for dehydration. It is important to replace the fluids that you lose from having watery poop and throwing up. Follow these instructions at home: Follow instructions from your doctor about how to care for yourself at home. Eating and drinking  Follow these instructions as told by your doctor:  Take an oral rehydration solution (ORS). This is a drink that is sold at pharmacies and stores.  Drink clear fluids in small amounts as you are able, such as: ? Water. ? Ice chips. ? Diluted fruit juice. ? Low-calorie sports drinks.  Eat bland, easy-to-digest foods in small amounts as you are able, such  as: ? Bananas. ? Applesauce. ? Rice. ? Low-fat (lean) meats. ? Toast. ? Crackers.  Avoid fluids that have a lot of sugar or caffeine in them.  Avoid alcohol.  Avoid spicy or fatty foods.  General instructions  Drink enough fluid to keep your pee (urine) clear or pale yellow.  Wash your hands often. If you cannot use soap and water, use hand sanitizer.  Make sure that all people in your home wash their hands well and often.  Rest at home while you get better.  Take over-the-counter and prescription medicines only as told by your doctor.  Watch your condition for any changes.  Take a warm bath to help with any burning or pain from having watery poop.  Keep all follow-up visits as told by your doctor. This is important. Contact a doctor if:  You cannot keep fluids down.  Your symptoms get worse.  You have new symptoms.  You feel light-headed or dizzy.  You have muscle cramps. Get help right away if:  You have chest pain.  You feel very weak or you pass out (faint).  You see blood in your throw-up.  Your throw-up looks like coffee grounds.  You have bloody or black poop (stools) or poop that look like tar.  You have a very bad headache, a stiff neck, or both.  You have a rash.  You have very bad pain, cramping, or bloating in your belly (abdomen).  You have trouble breathing.  You are breathing very quickly.  Your heart is beating  very quickly.  Your skin feels cold and clammy.  You feel confused.  You have pain when you pee.  You have signs of dehydration, such as: ? Dark pee, hardly any pee, or no pee. ? Cracked lips. ? Dry mouth. ? Sunken eyes. ? Sleepiness. ? Weakness. This information is not intended to replace advice given to you by your health care provider. Make sure you discuss any questions you have with your health care provider. Document Released: 10/01/2007 Document Revised: 11/02/2015 Document Reviewed: 12/19/2014 Elsevier  Interactive Patient Education  2017 [employer].

## 2018-03-22 ENCOUNTER — Other Ambulatory Visit: Payer: Self-pay | Admitting: Family Medicine

## 2018-03-22 DIAGNOSIS — I10 Essential (primary) hypertension: Secondary | ICD-10-CM

## 2018-03-22 MED FILL — AMLODIPINE BESYLATE 10 MG T: 10 | 90 days supply | Qty: 90 | Fill #0

## 2018-03-22 MED FILL — HYDROCHLOROTHIAZIDE 25 MG T: 25 | 90 days supply | Qty: 90 | Fill #0

## 2018-03-22 MED FILL — LOSARTAN POTASSIUM 100 MG T: 100 | 90 days supply | Qty: 90 | Fill #2

## 2018-04-05 ENCOUNTER — Ambulatory Visit: Payer: 59 | Admitting: Podiatry

## 2018-04-09 ENCOUNTER — Ambulatory Visit: Payer: 59 | Admitting: Podiatry

## 2018-04-09 ENCOUNTER — Other Ambulatory Visit: Payer: Self-pay | Admitting: Podiatry

## 2018-04-09 ENCOUNTER — Ambulatory Visit (INDEPENDENT_AMBULATORY_CARE_PROVIDER_SITE_OTHER): Payer: 59

## 2018-04-09 VITALS — HR 78

## 2018-04-09 DIAGNOSIS — M2141 Flat foot [pes planus] (acquired), right foot: Secondary | ICD-10-CM | POA: Diagnosis not present

## 2018-04-09 DIAGNOSIS — M79671 Pain in right foot: Secondary | ICD-10-CM

## 2018-04-09 DIAGNOSIS — M722 Plantar fascial fibromatosis: Secondary | ICD-10-CM | POA: Diagnosis not present

## 2018-04-09 DIAGNOSIS — M2142 Flat foot [pes planus] (acquired), left foot: Secondary | ICD-10-CM | POA: Diagnosis not present

## 2018-04-09 DIAGNOSIS — M79672 Pain in left foot: Secondary | ICD-10-CM

## 2018-04-09 DIAGNOSIS — M779 Enthesopathy, unspecified: Secondary | ICD-10-CM | POA: Diagnosis not present

## 2018-04-09 DIAGNOSIS — M778 Other enthesopathies, not elsewhere classified: Secondary | ICD-10-CM

## 2018-04-09 MED ORDER — MELOXICAM 15 MG PO TABS: 15.0000 mg | ORAL_TABLET | Freq: Every day | ORAL | 0 refills | Status: DC

## 2018-04-13 NOTE — Progress Notes (Signed)
Subjective:   Patient ID: William Franco, male   DOB: 42 y.o.   MRN: [id number]   HPI 42 year old male presents the office today for concerns of pain to both of his feet.  He states he has flatfeet and has been a chronic issue but he has been having some pain in the top of his foot for last couple of weeks described as sharp pain which is better with ice.  He states that it hurts less when he wear shoes.  Left side is worse than right.  It is in the same area on both feet.  He denies any recent injury or trauma.  He said no recent treatment.  He has no other concerns today.  Review of Systems  All other systems reviewed and are negative.  Past Medical History:  Diagnosis Date  . Hypertension   . Kidney stones   . 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 BACK SURGERY    . none       Current Outpatient Medications:  .  amLODipine (NORVASC) 10 MG tablet, TAKE 1 TABLET (10 MG TOTAL) BY MOUTH DAILY., Disp: 90 tablet, Rfl: 1 .  hydrochlorothiazide (HYDRODIURIL) 25 MG tablet, TAKE 1 TABLET (25 MG TOTAL) BY MOUTH DAILY., Disp: 90 tablet, Rfl: 1 .  losartan (COZAAR) 100 MG tablet, TAKE 1 TABLET BY MOUTH DAILY., Disp: 90 tablet, Rfl: 2 .  sildenafil (VIAGRA) 100 MG tablet, TAKE 1 TABLET BY MOUTH AS NEEDED FOR ERECTILE DYSFUNCTION., Disp: 5 tablet, Rfl: 6 .  valACYclovir (VALTREX) 500 MG tablet, Take 1 tablet (500 mg total) by mouth daily., Disp: 90 tablet, Rfl: 0 .  meloxicam (MOBIC) 15 MG tablet, Take 1 tablet (15 mg total) by mouth daily., Disp: 30 tablet, Rfl: 0  Allergies  Allergen Reactions  . Bee Venom Swelling       Objective:  Physical Exam  General: AAO x3, NAD  Dermatological: Skin is warm, dry and supple bilateral. There are no open sores, no preulcerative lesions, no rash or signs of infection present.  Vascular: Dorsalis Pedis artery and Posterior Tibial artery pedal pulses are 2/4  bilateral with immedate capillary fill time. Pedal hair growth present. No varicosities and no lower extremity edema present bilateral. There is no pain with calf compression, swelling, warmth, erythema.   Neruologic: Grossly intact via light touch bilateral. Vibratory intact via tuning fork bilateral. Protective threshold with Semmes Wienstein monofilament intact to all pedal sites bilateral.  Negative Tinel sign.  Musculoskeletal: Significant decrease in medial arch upon weightbearing bilaterally.  Ankle joint range of motion intact however decreased subtalar joint range of motion.  There is no area pinpoint bony tenderness or pain to vibratory sensation bilaterally.  Subjectively he is getting discomfort on the dorsal midfoot more along the Lisfranc joint.  Also at times he does get some pain along the arch of the foot recurring on experience any discomfort.  Point function appears to be intact.  Achilles tendon intact.  No significant edema, erythema, increase in warmth bilaterally.  Muscular strength 5/5 in all groups tested bilateral.  Gait: Unassisted, Nonantalgic.     Assessment:   Bilateral foot pain due to flatfoot deformity, capsulitis    Plan:  -Treatment options discussed including all alternatives, risks, and complications -Etiology of symptoms were discussed -X-rays were obtained and reviewed with the patient.  No definitive evidence of acute fracture or stress fracture identified today.  Foot  deformities present. -Prescribed mobic. Discussed side effects of the medication and directed to stop if any are to occur and call the office.  -Long-term I think he benefit from an orthotic.  Given his shoe size and his flatfoot as well as standing on hard surfaces all day he will benefit more from a custom orthotic.  Evaluate him today he was measured for orthotics.  Trula Slade DPM

## 2018-05-03 ENCOUNTER — Ambulatory Visit: Payer: 59 | Admitting: Orthotics

## 2018-05-03 DIAGNOSIS — M2142 Flat foot [pes planus] (acquired), left foot: Secondary | ICD-10-CM

## 2018-05-03 DIAGNOSIS — M722 Plantar fascial fibromatosis: Secondary | ICD-10-CM

## 2018-05-03 DIAGNOSIS — M2141 Flat foot [pes planus] (acquired), right foot: Secondary | ICD-10-CM

## 2018-05-03 NOTE — Progress Notes (Signed)
Patient came in today to pick up custom made foot orthotics.  The goals were accomplished and the patient reported no dissatisfaction with said orthotics.  Patient was advised of breakin period and how to report any issues. 

## 2018-05-21 ENCOUNTER — Other Ambulatory Visit: Payer: Self-pay

## 2018-05-21 ENCOUNTER — Ambulatory Visit (INDEPENDENT_AMBULATORY_CARE_PROVIDER_SITE_OTHER): Payer: 59 | Admitting: Family Medicine

## 2018-05-21 ENCOUNTER — Encounter: Payer: Self-pay | Admitting: Family Medicine

## 2018-05-21 VITALS — BP 120/86 | Temp 98.1°F | Wt 358.0 lb

## 2018-05-21 DIAGNOSIS — M25561 Pain in right knee: Secondary | ICD-10-CM

## 2018-05-21 DIAGNOSIS — G8929 Other chronic pain: Secondary | ICD-10-CM | POA: Diagnosis not present

## 2018-05-21 DIAGNOSIS — I10 Essential (primary) hypertension: Secondary | ICD-10-CM | POA: Diagnosis not present

## 2018-05-21 DIAGNOSIS — R252 Cramp and spasm: Secondary | ICD-10-CM | POA: Diagnosis not present

## 2018-05-21 DIAGNOSIS — M25562 Pain in left knee: Secondary | ICD-10-CM | POA: Diagnosis not present

## 2018-05-21 MED ORDER — IBUPROFEN 600 MG PO TABS: 600.0000 mg | ORAL_TABLET | Freq: Three times a day (TID) | ORAL | 0 refills | Status: DC | PRN

## 2018-05-21 MED FILL — IBUPROFEN 600 MG TABLET: 600 | 10 days supply | Qty: 30 | Fill #0

## 2018-05-21 NOTE — Patient Instructions (Signed)
Muscle Cramps and Spasms Muscle cramps and spasms are when muscles tighten by themselves. They usually get better within minutes. Muscle cramps are painful. They are usually stronger and last longer than muscle spasms. Muscle spasms may or may not be painful. They can last a few seconds or much longer. Cramps and spasms can affect any muscle, but they occur most often in the calf muscles of the leg. They are usually not caused by a serious problem. In many cases, the cause is not known. Some common causes include:  Doing more physical work or exercise than your body is ready for.  Using the muscles too much (overuse) by repeating certain movements too many times.  Staying in a certain position for a long time.  Playing a sport or doing an activity without preparing properly.  Using bad form or technique while playing a sport or doing an activity.  Not having enough water in your body (dehydration).  Injury.  Side effects of some medicines.  Low levels of the salts and minerals in your blood (electrolytes), such as low potassium or calcium. Follow these instructions at home: Managing pain and stiffness      Massage, stretch, and relax the muscle. Do this for many minutes at a time.  If told, put heat on tight or tense muscles as often as told by your doctor. Use the heat source that your doctor recommends, such as a moist heat pack or a heating pad. ? Place a towel between your skin and the heat source. ? Leave the heat on for 20-30 minutes. ? Remove the heat if your skin turns bright red. This is very important if you are not able to feel pain, heat, or cold. You may have a greater risk of getting burned.  If told, put ice on the affected area. This may help if you are sore or have pain after a cramp or spasm. ? Put ice in a plastic bag. ? Place a towel between your skin and the bag. ? Leave the ice on for 20 minutes, 2-3 times a day.  Try taking hot showers or baths to help  relax tight muscles. Eating and drinking  Drink enough fluid to keep your pee (urine) pale yellow.  Eat a healthy diet to help ensure that your muscles work well. This should include: ? Fruits and vegetables. ? Lean protein. ? Whole grains. ? Low-fat or nonfat dairy products. General instructions  If you are having cramps often, avoid intense exercise for several days.  Take over-the-counter and prescription medicines only as told by your doctor.  Watch for any changes in your symptoms.  Keep all follow-up visits as told by your doctor. This is important. Contact a doctor if:  Your cramps or spasms get worse or happen more often.  Your cramps or spasms do not get better with time. Summary  Muscle cramps and spasms are when muscles tighten by themselves. They usually get better within minutes.  Cramps and spasms occur most often in the calf muscles of the leg.  Massage, stretch, and relax the muscle. This may help the cramp or spasm go away.  Drink enough fluid to keep your pee (urine) pale yellow. This information is not intended to replace advice given to you by your health care provider. Make sure you discuss any questions you have with your health care provider. Document Released: 03/27/2008 Document Revised: 09/07/2017 Document Reviewed: 09/07/2017 Elsevier Interactive Patient Education  2019 [employer] Inc[employer]  

## 2018-05-21 NOTE — Assessment & Plan Note (Signed)
Chronic and recurrent. Likely due to overuse per is nature of work. I offered PT referral vs home knee exercise with pain meds. He will like to hold off on PT for now. Ibuprofen prn pain prescribed.  Return soon to PCP for reevaluation.

## 2018-05-21 NOTE — Progress Notes (Signed)
Subjective:     Patient ID: William Franco, male   DOB: [birth year], 43 y.o.   MRN: [id number]  Knee Pain   Incident onset: started a while ago but worsening in the past week. Incident location: No trauma or injury to his knees. Travel to [place] about 12 hour drive with his knee flexed. There was no injury mechanism. The pain is present in the right knee and left knee. The pain is at a severity of 3/10. The pain is mild. The pain has been fluctuating since onset. Pertinent negatives include no inability to bear weight, loss of motion, loss of sensation, muscle weakness, numbness or tingling. The symptoms are aggravated by movement and palpation. He has tried acetaminophen for the symptoms. The treatment provided mild relief.  he works an 8 hour shift in the [employer], standing on his feet or walking around. Cramping: C/O thigh cramping last night while trying to go to the bathroom. It was so back that he was unable to walk for about 15 min.  No redness, no swelling. Lasted 10-15 min. Denies similar thigh pain, he has hx of recurrent calf cramps in the past. Denies trauma or injury to his LL.  Current Outpatient Medications on File Prior to Visit  Medication Sig Dispense Refill  . amLODipine (NORVASC) 10 MG tablet TAKE 1 TABLET (10 MG TOTAL) BY MOUTH DAILY. 90 tablet 1  . hydrochlorothiazide (HYDRODIURIL) 25 MG tablet TAKE 1 TABLET (25 MG TOTAL) BY MOUTH DAILY. 90 tablet 1  . losartan (COZAAR) 100 MG tablet TAKE 1 TABLET BY MOUTH DAILY. 90 tablet 2  . meloxicam (MOBIC) 15 MG tablet Take 1 tablet (15 mg total) by mouth daily. (Patient not taking: Reported on 05/21/2018) 30 tablet 0  . sildenafil (VIAGRA) 100 MG tablet TAKE 1 TABLET BY MOUTH AS NEEDED FOR ERECTILE DYSFUNCTION. (Patient not taking: Reported on 05/21/2018) 5 tablet 6  . valACYclovir (VALTREX) 500 MG tablet Take 1 tablet (500 mg total) by mouth daily. (Patient not taking: Reported on 05/21/2018) 90 tablet 0   No current  facility-administered medications on file prior to visit.    Past Medical History:  Diagnosis Date  . Hypertension   . Kidney stones   . Spinal stenosis, lumbar region, 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   Vitals:   05/21/18 0924  BP: 120/86  Temp: 98.1 F (36.7 C)  TempSrc: Oral  Weight: (!) 358 lb (162.4 kg)    Review of Systems  Respiratory: Negative.   Cardiovascular: Negative.   Gastrointestinal: Negative.   Musculoskeletal: Positive for arthralgias and myalgias. Negative for gait problem.  Neurological: Negative.  Negative for tingling and numbness.  All other systems reviewed and are negative.      Objective:   Physical Exam Vitals signs and nursing note reviewed.  Constitutional:      Appearance: He is not ill-appearing or diaphoretic.  Cardiovascular:     Rate and Rhythm: Normal rate and regular rhythm.     Pulses: Normal pulses.     Heart sounds: Normal heart sounds. No murmur.  Pulmonary:     Effort: Pulmonary effort is normal. No respiratory distress.     Breath sounds: Normal breath sounds.  Abdominal:     General: Abdomen is flat. Bowel sounds are normal. There is no distension.     Tenderness: There is no abdominal tenderness.  Musculoskeletal: Normal range of motion.     Right knee: He exhibits normal range of motion, no swelling and no deformity.  Left knee: He exhibits normal range of motion, no swelling and no deformity.       Legs:  Neurological:     Mental Status: He is alert.        Assessment:     B/L Knee pain Muscle cramps    Plan:     Check problem list.

## 2018-05-21 NOTE — Assessment & Plan Note (Signed)
Etiology is unclear. Differentials include dehydration, overuse, electrolyte imbalance and idiopathic myopathy Check muscle enzymes and electrolyte. Keep well hydrated. Ibuprofen as needed for pain. Handout given regarding home management of cramping. I will contact him soon with test results.

## 2018-05-22 ENCOUNTER — Emergency Department (HOSPITAL_COMMUNITY)
Admission: EM | Admit: 2018-05-22 | Discharge: 2018-05-22 | Disposition: A | Discharge status: Home / Self Care | Payer: 59 | Attending: Emergency Medicine | Admitting: Emergency Medicine

## 2018-05-22 ENCOUNTER — Telehealth: Payer: Self-pay | Admitting: Family Medicine

## 2018-05-22 ENCOUNTER — Emergency Department (HOSPITAL_BASED_OUTPATIENT_CLINIC_OR_DEPARTMENT_OTHER): Payer: 59

## 2018-05-22 ENCOUNTER — Encounter (HOSPITAL_COMMUNITY): Payer: Self-pay | Admitting: Emergency Medicine

## 2018-05-22 ENCOUNTER — Other Ambulatory Visit: Payer: Self-pay | Admitting: Family Medicine

## 2018-05-22 DIAGNOSIS — Z79899 Other long term (current) drug therapy: Secondary | ICD-10-CM | POA: Insufficient documentation

## 2018-05-22 DIAGNOSIS — R897 Abnormal histological findings in specimens from other organs, systems and tissues: Secondary | ICD-10-CM

## 2018-05-22 DIAGNOSIS — R748 Abnormal levels of other serum enzymes: Secondary | ICD-10-CM | POA: Insufficient documentation

## 2018-05-22 DIAGNOSIS — F17210 Nicotine dependence, cigarettes, uncomplicated: Secondary | ICD-10-CM | POA: Diagnosis not present

## 2018-05-22 DIAGNOSIS — R252 Cramp and spasm: Secondary | ICD-10-CM

## 2018-05-22 LAB — BASIC METABOLIC PANEL
BUN/Creatinine Ratio: 15 (ref 9–20)
BUN: 14 mg/dL (ref 6–24)
CO2: 24 mmol/L (ref 20–29)
Calcium: 9.5 mg/dL (ref 8.7–10.2)
Chloride: 100 mmol/L (ref 96–106)
Creatinine, Ser: 0.94 mg/dL (ref 0.76–1.27)
GFR calc Af Amer: 114 mL/min/{1.73_m2} (ref >59)
GFR calc non Af Amer: 99 mL/min/{1.73_m2} (ref >59)
Glucose: 118 mg/dL — ABNORMAL HIGH (ref 65–99)
Potassium: 4.1 mmol/L (ref 3.5–5.2)
Sodium: 140 mmol/L (ref 134–144)

## 2018-05-22 LAB — CBC WITH DIFFERENTIAL/PLATELET
ABS IMMATURE GRANULOCYTES: 0.01 10*3/uL (ref 0.00–0.07)
Basophils Absolute: 0.1 10*3/uL (ref 0.0–0.1)
Basophils Relative: 1 %
Eosinophils Absolute: 0.2 10*3/uL (ref 0.0–0.5)
Eosinophils Relative: 4 %
HCT: 42.9 % (ref 39.0–52.0)
Hemoglobin: 12.8 g/dL — ABNORMAL LOW (ref 13.0–17.0)
Immature Granulocytes: 0 %
LYMPHS ABS: 2.2 10*3/uL (ref 0.7–4.0)
Lymphocytes Relative: 39 %
MCH: 22.1 pg — ABNORMAL LOW (ref 26.0–34.0)
MCHC: 29.8 g/dL — ABNORMAL LOW (ref 30.0–36.0)
MCV: 74.2 fL — ABNORMAL LOW (ref 80.0–100.0)
Monocytes Absolute: 0.6 10*3/uL (ref 0.1–1.0)
Monocytes Relative: 11 %
Neutro Abs: 2.5 10*3/uL (ref 1.7–7.7)
Neutrophils Relative %: 45 %
Platelets: 303 10*3/uL (ref 150–400)
RBC: 5.78 MIL/uL (ref 4.22–5.81)
RDW: 16 % — ABNORMAL HIGH (ref 11.5–15.5)
WBC: 5.5 10*3/uL (ref 4.0–10.5)
nRBC: 0 % (ref 0.0–0.2)

## 2018-05-22 LAB — COMPREHENSIVE METABOLIC PANEL
ALT: 50 U/L — ABNORMAL HIGH (ref 0–44)
AST: 32 U/L (ref 15–41)
Albumin: 4.2 g/dL (ref 3.5–5.0)
Alkaline Phosphatase: 43 U/L (ref 38–126)
Anion gap: 10 (ref 5–15)
BUN: 13 mg/dL (ref 6–20)
CO2: 27 mmol/L (ref 22–32)
Calcium: 9.1 mg/dL (ref 8.9–10.3)
Chloride: 100 mmol/L (ref 98–111)
Creatinine, Ser: 0.96 mg/dL (ref 0.61–1.24)
GFR calc Af Amer: >60 mL/min (ref >60)
GFR calc non Af Amer: >60 mL/min (ref >60)
Glucose, Bld: 128 mg/dL — ABNORMAL HIGH (ref 70–99)
Potassium: 3.2 mmol/L — ABNORMAL LOW (ref 3.5–5.1)
SODIUM: 137 mmol/L (ref 135–145)
Total Bilirubin: 0.5 mg/dL (ref 0.3–1.2)
Total Protein: 7.7 g/dL (ref 6.5–8.1)

## 2018-05-22 LAB — MYOGLOBIN, SERUM: Myoglobin: 102 ng/mL — ABNORMAL HIGH (ref 28–72)

## 2018-05-22 LAB — URINALYSIS, ROUTINE W REFLEX MICROSCOPIC
Bilirubin Urine: NEGATIVE
Glucose, UA: NEGATIVE mg/dL
Hgb urine dipstick: NEGATIVE
Ketones, ur: NEGATIVE mg/dL
Leukocytes, UA: NEGATIVE
Nitrite: NEGATIVE
Protein, ur: NEGATIVE mg/dL
SPECIFIC GRAVITY, URINE: 1.025 (ref 1.005–1.030)
pH: 5 (ref 5.0–8.0)

## 2018-05-22 LAB — CK
Total CK: 781 U/L (ref 24–204)
Total CK: 722 U/L — ABNORMAL HIGH (ref 49–397)

## 2018-05-22 LAB — I-STAT TROPONIN, ED: Troponin i, poc: 0 ng/mL (ref 0.00–0.08)

## 2018-05-22 MED ORDER — LACTATED RINGERS IV BOLUS
1000.0000 mL | Freq: Once | INTRAVENOUS | Status: AC
  Administered 2018-05-22: 1000 mL via INTRAVENOUS

## 2018-05-22 MED ORDER — POTASSIUM CHLORIDE CRYS ER 20 MEQ PO TBCR
40.0000 meq | EXTENDED_RELEASE_TABLET | Freq: Once | ORAL | Status: AC
  Administered 2018-05-22: 40 meq via ORAL
  Filled 2018-05-22: qty 2

## 2018-05-22 MED ORDER — SODIUM CHLORIDE 0.9 % IV BOLUS
1000.0000 mL | Freq: Once | INTRAVENOUS | Status: AC
  Administered 2018-05-22: 1000 mL via INTRAVENOUS

## 2018-05-22 NOTE — ED Triage Notes (Addendum)
Pt states he went to his PCP Friday for a sever leg cramp he had on Thursday. PCP called and told him to come to ED for abnormal CK of 701 and myoglobin of 102. Denies any pain today. Denies ever having chest pain. Denies muscle cramping today.

## 2018-05-22 NOTE — ED Notes (Signed)
Pt transported to vascular.  °

## 2018-05-22 NOTE — Telephone Encounter (Signed)
I discussed elevated CK and Myo with the patient. Likely due to his recent muscle cramps. He has not had an additional episode since then.  As discussed with him, HCTZ can cause muscle cramps, and Losartan can cause Rhabdo.  Plan discussed: Either go to the ED today to recheck labs to ensure it is not rising. In that case, he will need IV hydration. Or start aggressive oral hydration and return to the lab on Monday for a recheck, provided he remains asymptomatic.  He said he feels well for now and will watch out for symptoms. If he feels weak, fatigues, muscle pain, or cramping, he will go to the ED. Lab appointment scheduled. F/U with PCP to discuss meds scheduled. I answered all of his questions.

## 2018-05-22 NOTE — Progress Notes (Signed)
Left lower extremity venous duplex completed. Refer to "CV Proc" under chart review to view preliminary results.  05/22/2018 1:28 PM Maudry Mayhew, MHA, RVT, RDCS, RDMS

## 2018-05-22 NOTE — ED Provider Notes (Signed)
[facility]PART[facility] Provider Note   CSN: [id number] Arrival date & time: 05/22/18  1023     History   Chief Complaint Chief Complaint  Patient presents with  . Abnormal Lab    HPI William Franco is a 43 y.o. male.  HPI   Thursday night, woke up to use the bathroom and had a severe cramp in left leg Stood back up and had severe pain again in left leg.  Severe cramping, could not stand up or walk to the bathroom.  Back of upper left leg., hamstring. No back pain.  Was sharp severe pain.  Then felt "woosey", drank some gatorade, called girlfriend, calmed down and went to sleep, then next day went to the doctor.  Said it was cramps and ordered lab work, gave ibuprofen.  Took ibuprofen and felt better. Then this AM felt ok but then got phone call that CK.  No chest pain or dyspnea. Had some lightheadedness. NO new medications.  No recent surgeries or long trips (thanksgiving drove to [place] and back)  Past Medical History:  Diagnosis Date  . Hypertension   . Kidney stones   . 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 Bilateral knee pain 05/21/2018  . Muscle cramps 05/21/2018  . Gout 08/06/2016  . 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 Erectile dysfunction 04/19/2012  . Hypertension 12/18/2010  . Recurrent genital herpes 12/18/2010  . Tobacco abuse 12/18/2010  . Obesity 12/18/2010    Past Surgical History:  Procedure Laterality Date  . BACK SURGERY    . none          Home Medications    Prior to Admission medications   Medication Sig Start Date End Date Taking? Authorizing Provider  amLODipine (NORVASC) 10 MG tablet TAKE 1 TABLET (10 MG TOTAL) BY MOUTH DAILY. 03/22/18  Yes Chambliss, Jeb Levering, MD  hydrochlorothiazide (HYDRODIURIL) 25 MG tablet TAKE 1 TABLET (25 MG TOTAL) BY MOUTH DAILY. 03/22/18  Yes  Lind Covert, MD  ibuprofen (ADVIL,MOTRIN) 600 MG tablet Take 1 tablet (600 mg total) by mouth every 8 (eight) hours as needed. 05/21/18  Yes Kinnie Feil, MD  losartan (COZAAR) 100 MG tablet TAKE 1 TABLET BY MOUTH DAILY. Patient taking differently: Take 100 mg by mouth daily.  04/22/17  Yes Lind Covert, MD  Multiple Vitamin (MULTIVITAMIN WITH MINERALS) TABS tablet Take 1 tablet by mouth daily.   Yes [provider]  sildenafil (VIAGRA) 100 MG tablet TAKE 1 TABLET BY MOUTH AS NEEDED FOR ERECTILE DYSFUNCTION. 10/13/17  Yes Chambliss, Jeb Levering, MD  meloxicam (MOBIC) 15 MG tablet Take 1 tablet (15 mg total) by mouth daily. Patient not taking: Reported on 05/21/2018 04/09/18 04/09/19  Trula Slade, DPM  valACYclovir (VALTREX) 500 MG tablet Take 1 tablet (500 mg total) by mouth daily. Patient not taking: Reported on 05/21/2018 08/06/16   Lind Covert, MD    Family History Family History  Problem Relation Age of Onset  . Hypertension Father   . Hypertension Mother     Social History Social History   Tobacco Use  . Smoking status: Current Every Day Smoker    Packs/day: 0.10  . Smokeless tobacco: Current User  Substance Use Topics  . Alcohol use: Yes    Comment: ocassionaly  . Drug use: No     Allergies   Bee venom  Review of Systems Review of Systems  Constitutional: Negative for fever.  HENT: Negative for sore throat.   Eyes: Negative for visual disturbance.  Respiratory: Negative for cough and shortness of breath.   Cardiovascular: Negative for chest pain.  Gastrointestinal: Negative for abdominal pain, nausea and vomiting.  Genitourinary: Negative for difficulty urinating.  Musculoskeletal: Positive for arthralgias and myalgias. Negative for back pain and neck stiffness.  Skin: Negative for rash.  Neurological: Positive for light-headedness. Negative for syncope and headaches.     Physical Exam Updated Vital Signs BP  124/66   Pulse 70   Temp 98.4 F (36.9 C) (Oral)   Resp 17   SpO2 96%   Physical Exam Vitals signs and nursing note reviewed.  Constitutional:      General: He is not in acute distress.    Appearance: He is well-developed. He is not diaphoretic.  HENT:     Head: Normocephalic and atraumatic.  Eyes:     Conjunctiva/sclera: Conjunctivae normal.  Neck:     Musculoskeletal: Normal range of motion.  Cardiovascular:     Rate and Rhythm: Normal rate and regular rhythm.     Heart sounds: Normal heart sounds. No murmur. No friction rub. No gallop.   Pulmonary:     Effort: Pulmonary effort is normal. No respiratory distress.     Breath sounds: Normal breath sounds. No wheezing or rales.  Abdominal:     General: There is no distension.     Palpations: Abdomen is soft.     Tenderness: There is no abdominal tenderness. There is no guarding.  Skin:    General: Skin is warm and dry.  Neurological:     Mental Status: He is alert and oriented to person, place, and time.      ED Treatments / Results  Labs (all labs ordered are listed, but only abnormal results are displayed) Labs Reviewed  CK - Abnormal; Notable for the following components:      Result Value   Total CK 722 (*)    All other components within normal limits  COMPREHENSIVE METABOLIC PANEL - Abnormal; Notable for the following components:   Potassium 3.2 (*)    Glucose, Bld 128 (*)    ALT 50 (*)    All other components within normal limits  CBC WITH DIFFERENTIAL/PLATELET - Abnormal; Notable for the following components:   Hemoglobin 12.8 (*)    MCV 74.2 (*)    MCH 22.1 (*)    MCHC 29.8 (*)    RDW 16.0 (*)    All other components within normal limits  URINALYSIS, ROUTINE W REFLEX MICROSCOPIC  I-STAT TROPONIN, ED    EKG None  Radiology Vas [place] Lower Extremity Venous (dvt) (only [facility])  Result Date: 05/22/2018  Lower Venous Study Indications: Left leg cramps.  Performing Technologist: Maudry Mayhew MHA,  RDMS, RVT, RDCS  Examination Guidelines: A complete evaluation includes B-mode imaging, spectral Doppler, color Doppler, and power Doppler as needed of all accessible portions of each vessel. Bilateral testing is considered an integral part of a complete examination. Limited examinations for reoccurring indications may be performed as noted.  Right Venous Findings: +---+---------------+---------+-----------+----------+---------+    CompressibilityPhasicitySpontaneityPropertiesSummary   +---+---------------+---------+-----------+----------+---------+ CFVFull           No       Yes                  Pulsatile +---+---------------+---------+-----------+----------+---------+  Left Venous Findings: +---------+---------------+---------+-----------+----------+--------------+  CompressibilityPhasicitySpontaneityPropertiesSummary        +---------+---------------+---------+-----------+----------+--------------+ CFV      Full           No       Yes                  Pulsatile      +---------+---------------+---------+-----------+----------+--------------+ SFJ      Full                                                        +---------+---------------+---------+-----------+----------+--------------+ FV Prox  Full                                                        +---------+---------------+---------+-----------+----------+--------------+ FV Mid   Full                                                        +---------+---------------+---------+-----------+----------+--------------+ FV DistalFull                                                        +---------+---------------+---------+-----------+----------+--------------+ PFV      Full                                                        +---------+---------------+---------+-----------+----------+--------------+ POP      Full           No       Yes                  Pulsatile       +---------+---------------+---------+-----------+----------+--------------+ PTV                              Yes                  Patent         +---------+---------------+---------+-----------+----------+--------------+ PERO                                                  Not visualized +---------+---------------+---------+-----------+----------+--------------+    Summary: Right: No evidence of common femoral vein obstruction. Left: There is no evidence of deep vein thrombosis in the lower extremity. However, portions of this examination were limited- see technologist comments above. No cystic structure found in the popliteal fossa. Pulsatile venous flow is suggestive of possible elevated right-sided heart pressure.  *See table(s) above for measurements and observations.    Preliminary     Procedures Procedures (including critical care  time)  Medications Ordered in ED Medications  sodium chloride 0.9 % bolus 1,000 mL (0 mLs Intravenous Stopped 05/22/18 1321)  lactated ringers bolus 1,000 mL (0 mLs Intravenous Stopped 05/22/18 1459)  potassium chloride SA (K-DUR,KLOR-CON) CR tablet 40 mEq (40 mEq Oral Given 05/22/18 1531)     Initial Impression / Assessment and Plan / ED Course  I have reviewed the triage vital signs and the nursing notes.  Pertinent labs & imaging results that were available during my care of the patient were reviewed by me and considered in my medical decision making (see chart for details).     43yo male presents with concern for elevated CK found on outpatient labs after having muscle cramp 2 nights ago. Regarding muscular pain, normal distal pulses, no sign of acute arterial thrombus, no sign of DVT on [place]. No sign of abscess or cellulitis. No dyspnea, tachypnea or tachycardia, doubt PE.  Regarding CK-level in 700s yesterday. Recheck here shows slight decrease. Normal urinalysis, normal renal function, no sign of rhabdomyolysis. No continuing symptoms of muscle  ache or spasm.   Given 2L of IV fluids. Recommend continued hydration, PCP follow up. Patient discharged in stable condition with understanding of reasons to return.   Final Clinical Impressions(s) / ED Diagnoses   Final diagnoses:  Muscle cramp  Elevated CK    ED Discharge Orders    None       Gareth Morgan, MD 05/22/18 2108

## 2018-05-22 NOTE — ED Notes (Signed)
ED Provider at bedside. 

## 2018-05-24 ENCOUNTER — Other Ambulatory Visit: Payer: 59

## 2018-05-25 ENCOUNTER — Ambulatory Visit: Payer: 59 | Admitting: Family Medicine

## 2018-05-25 ENCOUNTER — Encounter: Payer: Self-pay | Admitting: Family Medicine

## 2018-05-25 ENCOUNTER — Other Ambulatory Visit: Payer: Self-pay

## 2018-05-25 DIAGNOSIS — Z720 Tobacco use: Secondary | ICD-10-CM | POA: Diagnosis not present

## 2018-05-25 NOTE — Progress Notes (Signed)
Subjective  William Franco is a 43 y.o. male is presenting with the following  LEG PAIN CRAMPS Five days ago had sudden severe nocturnal cramp in Left thigh.  Seen here and discovered to have high CK.  Sent to ER where was lower and work up was negative except for possible elevated heart pressure on doppler. No cramps since and left leg is feeling progressively better No recent changes in medications or diet or exercise   SMOKING 1 cigar a day.  No chest pain or shortness of breath or leg swelling   Chief Complaint noted Review of Symptoms - see HPI PMH - Smoking status noted.   Reviewed doppler   Objective Vital Signs reviewed BP (!) 142/82   Pulse 76   Temp 98.8 F (37.1 C) (Oral)   Ht 6\' 8"  (2.032 m)   Wt (!) 367 lb 12.8 oz (166.8 kg)   SpO2 99%   BMI 40.40 kg/m  Heart - Regular rate and rhythm.  No murmurs, gallops or rubs.    Lungs:  Normal respiratory effort, chest expands symmetrically. Lungs are clear to auscultation, no crackles or wheezes. Extremities:  No cyanosis, edema, or deformity noted with good range of motion of all major joints.    Can walk on heels toes and do partial deep knee bend Hamstrings feel tight when he bends  Assessments/Plans  LEG CRAMP Improved.  Do not feel need to recheck CK unless his improvement does not continue   See after visit summary for details of patient instuctions  Tobacco abuse Unchanged.  Suggested developing substitutes

## 2018-05-25 NOTE — Patient Instructions (Addendum)
Good to see you today!  Thanks for coming in.  If you have more cramps or the leg does not get back completely to normal then call me and we will check a blood test  Keep taking all your medications as you are.  Come back in 6-12 months for a blood pressure check  Do stretching exercises   Think of substitute for cigars

## 2018-05-25 NOTE — Assessment & Plan Note (Signed)
Unchanged.  Suggested developing substitutes

## 2018-07-19 ENCOUNTER — Other Ambulatory Visit: Payer: Self-pay | Admitting: Family Medicine

## 2018-07-19 MED FILL — AMLODIPINE BESYLATE 10 MG T: 10 | 90 days supply | Qty: 90 | Fill #0

## 2018-07-19 MED FILL — HYDROCHLOROTHIAZIDE 25 MG T: 25 | 90 days supply | Qty: 90 | Fill #0

## 2018-07-20 MED FILL — LOSARTAN POTASSIUM 100 MG T: 100 | 90 days supply | Qty: 90 | Fill #0

## 2018-07-20 MED FILL — SILDENAFIL CITRATE 100 MG T: 100 | 28 days supply | Qty: 5 | Fill #0

## 2018-10-19 ENCOUNTER — Encounter: Payer: Self-pay | Admitting: Family Medicine

## 2018-10-19 ENCOUNTER — Other Ambulatory Visit: Payer: Self-pay

## 2018-10-19 ENCOUNTER — Ambulatory Visit (INDEPENDENT_AMBULATORY_CARE_PROVIDER_SITE_OTHER): Payer: 59 | Admitting: Family Medicine

## 2018-10-19 DIAGNOSIS — I10 Essential (primary) hypertension: Secondary | ICD-10-CM

## 2018-10-19 DIAGNOSIS — Z6836 Body mass index (BMI) 36.0-36.9, adult: Secondary | ICD-10-CM | POA: Diagnosis not present

## 2018-10-19 DIAGNOSIS — T7840XA Allergy, unspecified, initial encounter: Secondary | ICD-10-CM | POA: Insufficient documentation

## 2018-10-19 DIAGNOSIS — E6609 Other obesity due to excess calories: Secondary | ICD-10-CM | POA: Diagnosis not present

## 2018-10-19 DIAGNOSIS — Z720 Tobacco use: Secondary | ICD-10-CM | POA: Diagnosis not present

## 2018-10-19 DIAGNOSIS — M1A0790 Idiopathic chronic gout, unspecified ankle and foot, without tophus (tophi): Secondary | ICD-10-CM | POA: Diagnosis not present

## 2018-10-19 DIAGNOSIS — I8390 Asymptomatic varicose veins of unspecified lower extremity: Secondary | ICD-10-CM | POA: Insufficient documentation

## 2018-10-19 DIAGNOSIS — I83812 Varicose veins of left lower extremities with pain: Secondary | ICD-10-CM

## 2018-10-19 DIAGNOSIS — J302 Other seasonal allergic rhinitis: Secondary | ICD-10-CM | POA: Insufficient documentation

## 2018-10-19 MED ORDER — ALBUTEROL SULFATE HFA 108 (90 BASE) MCG/ACT IN AERS: 2.0000 | INHALATION_SPRAY | Freq: Four times a day (QID) | RESPIRATORY_TRACT | 2 refills | Status: DC | PRN

## 2018-10-19 MED FILL — ALBUTEROL SULFATE HFA 108 (: 108 (90 BAS | 25 days supply | Qty: 18 | Fill #0

## 2018-10-19 NOTE — Assessment & Plan Note (Signed)
No flares.

## 2018-10-19 NOTE — Patient Instructions (Signed)
Good to see you today!  Thanks for coming in.  Will be thinking about you and your Mom.   Call if we can help in any way  Check your blood pressure every week or so Call if your blood pressure is regularly > 140/90  I sent in a prescription for your inhaler  Congrats on stopping cigars

## 2018-10-19 NOTE — Assessment & Plan Note (Addendum)
Reports having this from a young age but seems to be getting worse.  Heavy feeling and aching and enlarging.  Has never had evaluated.   Will refer to vascular surgery

## 2018-10-19 NOTE — Progress Notes (Signed)
Subjective  William Franco is a 43 y.o. male is presenting with the following  He is well except his Mom recently admitted to hospice after rapid unexpected decline.  He feels he is handling this and has lots of support  ALLERGIES Using loratadine regularly which keeps things under control.  When not he has to use his albuterol but recently not using at all.  Would like to have a MDI on hand.  No shortness of breath or chest pain with exertion or any wheezing  VARICOSITY  Reports having this from a young age but seems to be getting worse.  Heavy feeling and aching and enlarging.  Has never had evaluated. No ulcers or distal claudication  SMOKING Has not had a cigar for about 1.5 months.  Hopes to stop forever   HYPERTENSION Disease Monitoring  Home BP Monitoring (Severity) not checking Symptoms - Chest pain- no    Dyspnea- no Medications (Modifying factors) Compliance-  daily. Lightheadedness-  no  Edema- no Timing - continuous  Duration - years ROS - See HPI  PMH Lab Review   Potassium  Date Value Ref Range Status  05/22/2018 3.2 (L) 3.5 - 5.1 mmol/L Final   Sodium  Date Value Ref Range Status  05/22/2018 137 135 - 145 mmol/L Final  05/21/2018 140 134 - 144 mmol/L Final   Creat  Date Value Ref Range Status  11/14/2015 0.97 0.60 - 1.35 mg/dL Final   Creatinine, Ser  Date Value Ref Range Status  05/22/2018 0.96 0.61 - 1.24 mg/dL Final       Chief Complaint noted Review of Symptoms - see HPI PMH - Smoking status noted.    Objective Vital Signs reviewed BP 140/82   Pulse 87   Ht 6\' 8"  (2.032 m)   Wt (!) 350 lb 9.6 oz (159 kg)   SpO2 99%   BMI 38.52 kg/m  Heart - Regular rate and rhythm.  No murmurs, gallops or rubs.    Lungs:  Normal respiratory effort, chest expands symmetrically. Lungs are clear to auscultation, no crackles or wheezes. Extrem - L mid posterior thigh large semi circumferential varicosity up to 2 cm in diameter.  No skin breakdown.  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  Heavy feeling and aching and enlarging.  Has never had evaluated.   Will refer to vascular surgery   Tobacco abuse Improved abstinent for 6 weeks   Gout No flares  Hypertension At goal today.  Recent labs normal   Obesity Reports losing weight in the last month with exercise and diet   Allergy Has seasonal allergies controlled with otc loratadine.  When not taking sometimes he feels he has wheezing and uses albuterol.  No use recently

## 2018-10-19 NOTE — Assessment & Plan Note (Signed)
Improved abstinent for 6 weeks

## 2018-10-19 NOTE — Assessment & Plan Note (Signed)
Reports losing weight in the last month with exercise and diet

## 2018-10-19 NOTE — Assessment & Plan Note (Signed)
At goal today.  Recent labs normal

## 2018-10-19 NOTE — Assessment & Plan Note (Signed)
Has seasonal allergies controlled with otc loratadine.  When not taking sometimes he feels he has wheezing and uses albuterol.  No use recently

## 2018-11-08 ENCOUNTER — Other Ambulatory Visit: Payer: Self-pay | Admitting: Family Medicine

## 2018-11-08 DIAGNOSIS — I10 Essential (primary) hypertension: Secondary | ICD-10-CM

## 2018-11-08 MED FILL — SILDENAFIL CITRATE 100 MG T: 100 | 28 days supply | Qty: 5 | Fill #0

## 2018-11-08 MED FILL — HYDROCHLOROTHIAZIDE 25 MG T: 25 | 90 days supply | Qty: 90 | Fill #0

## 2018-11-08 MED FILL — AMLODIPINE BESYLATE 10 MG T: 10 | 90 days supply | Qty: 90 | Fill #0

## 2018-11-08 MED FILL — LOSARTAN POTASSIUM 100 MG T: 100 | 90 days supply | Qty: 90 | Fill #0

## 2019-01-20 ENCOUNTER — Other Ambulatory Visit: Payer: Self-pay

## 2019-01-20 DIAGNOSIS — I739 Peripheral vascular disease, unspecified: Secondary | ICD-10-CM

## 2019-01-20 DIAGNOSIS — I83819 Varicose veins of unspecified lower extremities with pain: Secondary | ICD-10-CM

## 2019-01-21 ENCOUNTER — Other Ambulatory Visit: Payer: Self-pay

## 2019-01-21 ENCOUNTER — Ambulatory Visit (HOSPITAL_COMMUNITY)
Admission: RE | Admit: 2019-01-21 | Discharge: 2019-01-21 | Disposition: A | Discharge status: Home / Self Care | Payer: 59 | Source: Ambulatory Visit | Attending: Family | Admitting: Family

## 2019-01-21 DIAGNOSIS — I83819 Varicose veins of unspecified lower extremities with pain: Secondary | ICD-10-CM | POA: Insufficient documentation

## 2019-01-24 ENCOUNTER — Ambulatory Visit (HOSPITAL_COMMUNITY): Payer: 59

## 2019-01-24 ENCOUNTER — Other Ambulatory Visit: Payer: Self-pay

## 2019-01-24 ENCOUNTER — Encounter: Payer: Self-pay | Admitting: Surgery

## 2019-01-24 ENCOUNTER — Ambulatory Visit (HOSPITAL_COMMUNITY)
Admission: RE | Admit: 2019-01-24 | Discharge: 2019-01-24 | Disposition: A | Discharge status: Home / Self Care | Payer: 59 | Source: Ambulatory Visit | Attending: Surgery | Admitting: Surgery

## 2019-01-24 ENCOUNTER — Ambulatory Visit (INDEPENDENT_AMBULATORY_CARE_PROVIDER_SITE_OTHER): Payer: 59 | Admitting: Surgery

## 2019-01-24 VITALS — BP 138/90 | HR 82 | Temp 97.8°F | Resp 20 | Ht >= 80 in | Wt 364.0 lb

## 2019-01-24 DIAGNOSIS — I83819 Varicose veins of unspecified lower extremities with pain: Secondary | ICD-10-CM | POA: Diagnosis not present

## 2019-01-24 DIAGNOSIS — I739 Peripheral vascular disease, unspecified: Secondary | ICD-10-CM | POA: Diagnosis not present

## 2019-01-24 NOTE — Progress Notes (Signed)
[facility] [place]  Patient name: William Franco MRN: [id number] DOB: [birth year] Sex: male   REQUESTING PROVIDER:    Dr. Erin Hearing   REASON FOR CONSULT:    Varicose veins  HISTORY OF PRESENT ILLNESS:   William Franco is a 43 y.o. 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  The patient states that beginning approximately 6 months ago he noticed heaviness with walking.  His leg would become tight and he developed some weakness.  His symptoms would improve with rest.  This past week he had severe pain in his leg while working.  He states that he has had a prominent varicosity on the medial posterior side of his left leg for many many years.  He now feels that this is getting bigger and new veins are becoming more prominent.  The leg does swell.  He is medically managed for hypertension.  He has a history of renal nephrolithiasis and nonoperative spinal stenosis.  PAST MEDICAL HISTORY    Past Medical History:  Diagnosis Date  . Hypertension   . Kidney stones   . Spinal stenosis, lumbar region, 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     FAMILY HISTORY   Family History  Problem Relation Age of Onset  . Hypertension Father   . Hypertension Mother     SOCIAL HISTORY:   Social History   Socioeconomic History  . Marital status: Divorced    Spouse name: Not on file  . Number of children: Not on file  . Years of education: Not on file  . Highest education level: Not on file  Occupational History  . Not on file  Social Needs  . Financial resource strain: Not on file  . Food insecurity    Worry: Not on file    Inability: Not on file  . Transportation needs    Medical: Not on file    Non-medical: Not on file  Tobacco Use  . Smoking status: Current Every Day Smoker    Packs/day: 0.10  . Smokeless tobacco: Current User  Substance and  Sexual Activity  . Alcohol use: Yes    Comment: ocassionaly  . Drug use: No  . Sexual activity: Not on file  Lifestyle  . Physical activity    Days per week: Not on file    Minutes per session: Not on file  . Stress: Not on file  Relationships  . Social Herbalist on phone: Not on file    Gets together: Not on file    Attends religious service: Not on file    Active member of club or organization: Not on file    Attends meetings of clubs or organizations: Not on file    Relationship status: Not on file  . Intimate partner violence    Fear of current or ex partner: Not on file    Emotionally abused: Not on file    Physically abused: Not on file    Forced sexual activity: Not on file  Other Topics Concern  . Not on file  Social History Narrative   Divorced with no children. 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  Family in [place]          ALLERGIES:    Allergies  Allergen Reactions  . Bee Venom Swelling    CURRENT MEDICATIONS:    Current Outpatient Medications  Medication Sig Dispense Refill  .  albuterol (VENTOLIN HFA) 108 (90 Base) MCG/ACT inhaler Inhale 2 puffs into the lungs every 6 (six) hours as needed for wheezing. 18 g 2  . amLODipine (NORVASC) 10 MG tablet TAKE 1 TABLET BY MOUTH DAILY. 90 tablet 1  . hydrochlorothiazide (HYDRODIURIL) 25 MG tablet TAKE 1 TABLET BY MOUTH DAILY. 90 tablet 1  . ibuprofen (ADVIL,MOTRIN) 600 MG tablet Take 1 tablet (600 mg total) by mouth every 8 (eight) hours as needed. 30 tablet 0  . losartan (COZAAR) 100 MG tablet Take 1 tablet (100 mg total) by mouth at bedtime. TAKE 1 TABLET BY MOUTH DAILY. 90 tablet 2  . Multiple Vitamin (MULTIVITAMIN WITH MINERALS) TABS tablet Take 1 tablet by mouth daily.    . sildenafil (VIAGRA) 100 MG tablet TAKE 1 TABLET BY MOUTH AS NEEDED FOR ERECTILE DYSFUNCTION. 5 tablet 0  . valACYclovir (VALTREX) 500 MG tablet Take 1 tablet (500 mg total) by mouth daily. (Patient not taking: Reported on  05/21/2018) 90 tablet 0   No current facility-administered medications for this visit.     REVIEW OF SYSTEMS:   [X]  denotes positive finding, [ ]  denotes negative finding Cardiac  Comments:  Chest pain or chest pressure: x   Shortness of breath upon exertion:    Short of breath when lying flat:    Irregular heart rhythm:        Vascular    Pain in calf, thigh, or hip brought on by ambulation: x   Pain in feet at night that wakes you up from your sleep:     Blood clot in your veins:    Leg swelling:         Pulmonary    Oxygen at home:    Productive cough:     Wheezing:         Neurologic    Sudden weakness in arms or legs:     Sudden numbness in arms or legs:     Sudden onset of difficulty speaking or slurred speech:    Temporary loss of vision in one eye:     Problems with dizziness:         Gastrointestinal    Blood in stool:      Vomited blood:         Genitourinary    Burning when urinating:     Blood in urine:        Psychiatric    Major depression:         Hematologic    Bleeding problems:    Problems with blood clotting too easily:        Skin    Rashes or ulcers:        Constitutional    Fever or chills:     PHYSICAL EXAM:   There were no vitals filed for this visit.  GENERAL: The patient is a well-nourished male, in no acute distress. The vital signs are documented above. CARDIAC: There is a regular rate and rhythm.  VASCULAR: Trace edema in the left leg.  Very prominent cluster of varicosities which are large on the medial posterior side of the left leg just above the knee. PULMONARY: Nonlabored respirations MUSCULOSKELETAL: There are no major deformities or cyanosis. NEUROLOGIC: No focal weakness or paresthesias are detected. SKIN: There are no ulcers or rashes noted. PSYCHIATRIC: The patient has a normal affect.  STUDIES:   I have reviewed the following duplex:  ABI/TBIToday's ABIToday's TBI     Previous ABIPrevious TBI  +-------+-----------+----------------+------------+------------+ Right  1.16       Non-Compressible                         +-------+-----------+----------------+------------+------------+ Left   1.11       Non-Compressible                         +-------+-----------+----------------+------------+------------+  Venous Reflux Times Normal value < 0.5 sec +------------------------------+----------+---------+                               Right (ms)Left (ms) +------------------------------+----------+---------+ CFV                           1071.00   2692.00   +------------------------------+----------+---------+ GSV at Saphenofemoral junction873.00    5208.00   +------------------------------+----------+---------+ GSV prox thigh                653.00    1093.00   +------------------------------+----------+---------+ GSV mid thigh                 2758.00   763.00    +------------------------------+----------+---------+ GSV dist thigh                2112.00   1130.00   +------------------------------+----------+---------+ GSV at knee                   2413.00   1980.00   +------------------------------+----------+---------+ SSV mid                       535.00    1460.00   +------------------------------+----------+---------+  +------------------------------+----------+---------+ VEIN DIAMETERS:               Right (cm)Left (cm) +------------------------------+----------+---------+ GSV at Saphenofemoral junction0.741     1.06      +------------------------------+----------+---------+ GSV at prox thigh             0.557     1.02      +------------------------------+----------+---------+ GSV at mid thigh              0.488     0.888     +------------------------------+----------+---------+ GSV at distal thigh           0.249     0.817     +------------------------------+----------+---------+ GSV at knee                    0.250     0.684     +------------------------------+----------+---------+ GSV prox calf                 0.430     0.614     +------------------------------+----------+---------+ GSV mid calf                  0.414     0.457     +------------------------------+----------+---------+ SSV origin                    0.393     0.370     +------------------------------+----------+---------+ SSV prox                      0.407     0.302     +------------------------------+----------+---------+ SSV mid                       0.342  0.247     +------------------------------+----------+---------+    Right Reflux Technical Findings: No evidence of DVT, SVT, or Baker's cyst. The sapheno-femoral junction is incompetent. The GSV demonstrates reflux from the sapheno-femoral junction in to the calf. There is a large, incompetent branch of the GSV rising out of the fascia from the distal thigh to the level of the popliteal. The SSV demonstrates reflux in the mid calf. The CFV demonstrates reflux. Left Reflux Technical Findings: No evidence of DVT, SVT, or Baker's cyst. The sapheno-femoral junction is incompetent. The GSV demonstrates reflux from the sapheno-femoral junction into the calf with a large branch at the distal thigh giving rise to a symptomatic cluster of bulging varicose veins on the proximal posterior calf. The SSV demonstrates reflux in the mid calf. The CFV demonstrates reflux.   Summary: Right: No reflux was noted in the femoral vein in the thigh, popliteal vein, and proximal small saphenous vein. Abnormal reflux times were noted in the common femoral vein, great saphenous vein at the saphenofemoral junction, great saphenous vein at the  proximal thigh, great saphenous vein at the mid thigh, great saphenous vein at the distal thigh, great saphenous vein at the knee, and mid small saphenous vein. Evidence of chronic venous insufficiency is detected in the great  saphenous vein, small  saphenous vein, and deep venous system. There is no evidence of deep vein thrombosis in the lower extremity. There is no evidence of superficial venous thrombosis. No cystic structure found in the popliteal fossa. Left: No reflux was noted in the femoral vein in the thigh, popliteal vein, and proximal small saphenous vein. Abnormal reflux times were noted in the common femoral vein, great saphenous vein at the saphenofemoral junction, great saphenous vein at the  proximal thigh, great saphenous vein at the mid thigh, great saphenous vein at the distal thigh, great saphenous vein at the knee, great saphenous vein at the proximal calf, and mid small saphenous vein. Evidence of chronic venous insufficiency is  detected in the great saphenous vein, small saphenous vein, and deep venous system. There is no evidence of deep vein thrombosis in the lower extremity. There is no evidence of superficial venous thrombosis. No cystic structure found in the popliteal  fossa. ASSESSMENT and PLAN   CEAP class 3: The patient has been wearing thigh-high compression stockings for 1 month.  I have given him additional information about getting additional pairs of 20-30 thigh-high compression at [facility].  He states that already with chronic depression he has noticed improvement in his symptoms.  We discussed a total of 36-month trial of compression stockings to see how well he does.  I have him scheduled to return in 2 months to the vein center for evaluation of laser ablation and stab phlebectomy.   Leia Alf, MD, FACS [facility] [phone] Pager [phone]

## 2019-01-26 ENCOUNTER — Other Ambulatory Visit: Payer: Self-pay | Admitting: Family Medicine

## 2019-01-26 MED FILL — VALACYCLOVIR HCL 500 MG TAB: 500 | 90 days supply | Qty: 90 | Fill #0

## 2019-03-01 ENCOUNTER — Other Ambulatory Visit: Payer: Self-pay | Admitting: Family Medicine

## 2019-03-01 MED FILL — LOSARTAN POTASSIUM 100 MG T: 100 | 90 days supply | Qty: 90 | Fill #1

## 2019-03-01 MED FILL — AMLODIPINE BESYLATE 10 MG T: 10 | 90 days supply | Qty: 90 | Fill #1

## 2019-03-01 MED FILL — HYDROCHLOROTHIAZIDE 25 MG T: 25 | 90 days supply | Qty: 90 | Fill #1

## 2019-03-02 MED FILL — SILDENAFIL CITRATE 100 MG T: 100 | 28 days supply | Qty: 5 | Fill #0

## 2019-05-05 ENCOUNTER — Ambulatory Visit (INDEPENDENT_AMBULATORY_CARE_PROVIDER_SITE_OTHER): Payer: 59 | Admitting: Vascular Surgery

## 2019-05-05 ENCOUNTER — Encounter: Payer: Self-pay | Admitting: Vascular Surgery

## 2019-05-05 ENCOUNTER — Other Ambulatory Visit: Payer: Self-pay

## 2019-05-05 VITALS — BP 136/80 | HR 78 | Temp 96.3°F | Resp 18 | Ht >= 80 in | Wt 375.0 lb

## 2019-05-05 DIAGNOSIS — I83813 Varicose veins of bilateral lower extremities with pain: Secondary | ICD-10-CM

## 2019-05-05 NOTE — Progress Notes (Signed)
Patient name: William Franco MRN: [id number] DOB: July 30, 1975 Sex: male  REASON FOR VISIT:   Follow-up of varicose veins.  HPI:   William Franco is a pleasant 44 y.o. male who was seen by Dr. Trula Slade on 01/24/2019 with varicose veins.  These began approximately 6 months prior.  He describes significant heaviness in his legs which is aggravated by walking.  His symptoms improved with rest and elevation.  He also was noted to have some prominent varicosities along the medial posterior side of his left leg for many years.  These were getting bigger.  Since he was seen last he continues to have aching pain and heaviness in the left leg which is aggravated by standing and worse at the end of the day.  He has been wearing his thigh-high compression stockings which do help some.  He also elevates his legs which help.  He takes ibuprofen as needed for pain but has not required this as much since he has been wearing his stockings.  He has had no previous venous procedures.  He has no history of DVT.  Past Medical History:  Diagnosis Date  . Hypertension   . Kidney stones   . 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 Hypertension Father   . Hypertension Mother     SOCIAL HISTORY: Social History   Tobacco Use  . Smoking status: Current Every Day Smoker    Types: Cigars  . Smokeless tobacco: Never Used  Substance Use Topics  . Alcohol use: Yes    Comment: ocassionaly    Allergies  Allergen Reactions  . Bee Venom Swelling    Current Outpatient Medications  Medication Sig Dispense Refill  . albuterol (VENTOLIN HFA) 108 (90 Base) MCG/ACT inhaler Inhale 2 puffs into the lungs every 6 (six) hours as needed for wheezing. 18 g 2  . amLODipine (NORVASC) 10 MG tablet TAKE 1 TABLET BY MOUTH DAILY. 90 tablet 1  . hydrochlorothiazide (HYDRODIURIL) 25 MG tablet TAKE 1 TABLET BY MOUTH DAILY. 90 tablet 1    . ibuprofen (ADVIL,MOTRIN) 600 MG tablet Take 1 tablet (600 mg total) by mouth every 8 (eight) hours as needed. 30 tablet 0  . losartan (COZAAR) 100 MG tablet Take 1 tablet (100 mg total) by mouth at bedtime. TAKE 1 TABLET BY MOUTH DAILY. 90 tablet 2  . Multiple Vitamin (MULTIVITAMIN WITH MINERALS) TABS tablet Take 1 tablet by mouth daily.    . sildenafil (VIAGRA) 100 MG tablet TAKE 1 TABLET BY MOUTH AS NEEDED FOR ERECTILE DYSFUNCTION. 5 tablet 11  . valACYclovir (VALTREX) 500 MG tablet TAKE 1 TABLET BY MOUTH DAILY. 90 tablet 1   No current facility-administered medications for this visit.    REVIEW OF SYSTEMS:  [X]  denotes positive finding, [ ]  denotes negative finding Cardiac  Comments:  Chest pain or chest pressure:    Shortness of breath upon exertion:    Short of breath when lying flat:    Irregular heart rhythm:        Vascular    Pain in calf, thigh, or hip brought on by ambulation:    Pain in feet at night that wakes you up from your sleep:     Blood clot in your veins:    Leg swelling:         Pulmonary    Oxygen at home:    Productive cough:     Wheezing:  Neurologic    Sudden weakness in arms or legs:     Sudden numbness in arms or legs:     Sudden onset of difficulty speaking or slurred speech:    Temporary loss of vision in one eye:     Problems with dizziness:         Gastrointestinal    Blood in stool:     Vomited blood:         Genitourinary    Burning when urinating:     Blood in urine:        Psychiatric    Major depression:         Hematologic    Bleeding problems:    Problems with blood clotting too easily:        Skin    Rashes or ulcers:        Constitutional    Fever or chills:     PHYSICAL EXAM:   Vitals:   05/05/19 1430  BP: 136/80  Pulse: 78  Resp: 18  Temp: (!) 96.3 F (35.7 C)  TempSrc: Temporal  SpO2: 100%  Weight: (!) 375 lb (170.1 kg)  Height: 6\' 8"  (2.032 m)    GENERAL: The patient is a well-nourished  male, in no acute distress. The vital signs are documented above. CARDIAC: There is a regular rate and rhythm.  VASCULAR: I do not detect carotid bruits. On the left leg he has a large cluster varicose veins in the posterior left thigh and medial thigh. I looked at his left great saphenous vein myself with the Sono[employer].  Is significantly dilated with reflux from the saphenofemoral junction to above the knee.  There is 1 area that is markedly dilated in the proximal thigh.    PULMONARY: There is good air exchange bilaterally without wheezing or rales. ABDOMEN: Soft and non-tender with normal pitched bowel sounds.  MUSCULOSKELETAL: There are no major deformities or cyanosis. NEUROLOGIC: No focal weakness or paresthesias are detected. SKIN: There are no ulcers or rashes noted. PSYCHIATRIC: The patient has a normal affect.  DATA:    VENOUS DUPLEX: I have reviewed his venous duplex scan that was done back in September.  On the left side, which is the more symptomatic side, there is no evidence of DVT.  There is deep venous reflux involving the common femoral vein.  There is significant superficial venous reflux in the great saphenous vein from the saphenofemoral junction to the knee.  The vein is markedly dilated with diameters up to 1.02 cm.  On the right side, there is no evidence of DVT.  There is deep venous reflux involving the common femoral vein.  There is superficial venous reflux involving the great saphenous vein from the saphenofemoral junction to the knee.  The vein is dilated up to 0.56 cm in the thigh.  ARTERIAL DOPPLER STUDY: I have reviewed his previous arterial Doppler study from September.  On the right side he has a triphasic dorsalis pedis and posterior tibial signal.  ABIs 100%.  On the left side he has a triphasic dorsalis pedis and posterior tibial signal.  ABI is 100%.  MEDICAL ISSUES:   CHRONIC VENOUS INSUFFICIENCY: This patient has significant symptoms related to  his reflux in the left great saphenous vein with a large cluster of varicose veins in the posterior medial left thigh.  He has failed conservative treatment including elevation, ibuprofen as needed for pain, and compression therapy.  I think he would be a good candidate for laser  ablation of the left great saphenous vein with 10-20 stabs.  I have discussed the indications for endovenous laser ablation of the left GSV, that is to lower the pressure in the veins and potentially help relieve the symptoms from venous hypertension. I have also discussed alternative options including conservative treatment with leg elevation, compression therapy, exercise, avoiding prolonged sitting and standing, and weight management. I have discussed the potential complications of the procedure, including, but not limited to: bleeding, bruising, leg swelling, nerve injury, skin burns, significant pain from phlebitis, deep venous thrombosis, or failure of the vein to close.  I have also explained that venous insufficiency is a chronic disease, and that the patient is at risk for recurrent varicose veins in the future.  All of the patient's questions were encouraged and answered. They are agreeable to proceed.   I have discussed with the patient the indications for stab phlebectomy.  I have explained to the patient that that will have small scars from the stab incisions.  I explained that the other risks include leg swelling, bruising, bleeding, and phlebitis.  All the patient's questions were encouraged and answered and they are agreeable to proceed.  We have also discussed the importance of intermittent leg elevation, continued use of compression therapy, and trying to avoid prolonged sitting and standing.  We will schedule him for his procedure in the near future.   Deitra Mayo [facility] [phone]

## 2019-05-17 ENCOUNTER — Encounter: Payer: Self-pay | Admitting: Vascular Surgery

## 2019-05-18 ENCOUNTER — Other Ambulatory Visit: Payer: Self-pay

## 2019-05-18 ENCOUNTER — Encounter: Payer: Self-pay | Admitting: Family Medicine

## 2019-05-18 ENCOUNTER — Ambulatory Visit (INDEPENDENT_AMBULATORY_CARE_PROVIDER_SITE_OTHER): Payer: 59 | Admitting: Family Medicine

## 2019-05-18 VITALS — BP 134/84 | HR 74 | Wt 379.2 lb

## 2019-05-18 DIAGNOSIS — I10 Essential (primary) hypertension: Secondary | ICD-10-CM

## 2019-05-18 DIAGNOSIS — Z720 Tobacco use: Secondary | ICD-10-CM

## 2019-05-18 DIAGNOSIS — R809 Proteinuria, unspecified: Secondary | ICD-10-CM | POA: Diagnosis not present

## 2019-05-18 DIAGNOSIS — E6609 Other obesity due to excess calories: Secondary | ICD-10-CM

## 2019-05-18 DIAGNOSIS — Z6836 Body mass index (BMI) 36.0-36.9, adult: Secondary | ICD-10-CM

## 2019-05-18 DIAGNOSIS — R200 Anesthesia of skin: Secondary | ICD-10-CM

## 2019-05-18 DIAGNOSIS — R7303 Prediabetes: Secondary | ICD-10-CM | POA: Diagnosis not present

## 2019-05-18 DIAGNOSIS — R202 Paresthesia of skin: Secondary | ICD-10-CM | POA: Diagnosis not present

## 2019-05-18 DIAGNOSIS — N183 Chronic kidney disease, stage 3 unspecified: Secondary | ICD-10-CM | POA: Diagnosis not present

## 2019-05-18 LAB — POCT GLYCOSYLATED HEMOGLOBIN (HGB A1C): HbA1c, POC (controlled diabetic range): 6.5 % (ref 0.0–7.0)

## 2019-05-18 MED ORDER — METFORMIN HCL 500 MG PO TABS: 500.0000 mg | ORAL_TABLET | Freq: Two times a day (BID) | ORAL | 2 refills | Status: DC

## 2019-05-18 MED FILL — metFORMIN HCL 500 MG TABS: 500 | 30 days supply | Qty: 60 | Fill #0

## 2019-05-18 NOTE — Patient Instructions (Signed)
Good to see you today!  Thanks for coming in.  Take the metformin 1/2 tab in the AM for 2-3 days then add 1/2 tab in the PM then move up to 1 tab twice a day  Weigh yourself once every 4-5 days   Consider decreasing the sugar in your drinks - water and unsweetened things   Cut down gradually on sweets  Smaller portion sizes  Aim lose about 2 lbs a week  My Chart me in one month with your weight and how you are doing   Come back in 3 months

## 2019-05-18 NOTE — Assessment & Plan Note (Signed)
Worsened.  Discussed diet and exercise and goals.  Does not want to see dietician now

## 2019-05-18 NOTE — Assessment & Plan Note (Signed)
Good control continue current medications Check labs

## 2019-05-18 NOTE — Progress Notes (Signed)
   CHIEF COMPLAINT / HPI:  FEET NUMBNESS For last 1-2 months actually better in last week.  No sores or pain.  No leg weakness.  Is wearing bilaterall compression sleeves for his varicose vein to quality for surgery  WEIGHT GAIN PREDIABETES Has gained 30 lbs in last months.  Mom died about 3 months ago and stress with covid causing to eat more.  No fh of diabetes   PERTINENT  PMH / PSH: steady weight gain    OBJECTIVE: BP 134/84   Pulse 74   Wt (!) 379 lb 3.2 oz (172 kg)   SpO2 99%   BMI 41.66 kg/m   Feet - both have low arches with mild edema (wearing bilateral leg sleeves due to varicose vein on L.  Sensation intact to monofilament.  Skin warm without lesions.  Faint DP pulses  ASSESSMENT / PLAN:  Obesity Worsened.  Discussed diet and exercise and goals.  Does not want to see dietician now   Hypertension Good control continue current medications Check labs   Prediabetes Discussed correlation with weight.  He would like to try metformin.  Plans to work on weight lowering his intake   Tobacco abuse Restarted cigars with recent stressors of his mother'd death (which he feels he is coping with well)  Plans to decrease and stop   Numbness and tingling of foot Bilateral.  Wonder if could be due to bilaterall compression sleeves.  He will try with these off.  Also likely due to weight gain.   No signs of ischemia or definite neuropathy     Lind Covert, MD [place]

## 2019-05-18 NOTE — Assessment & Plan Note (Signed)
Discussed correlation with weight.  He would like to try metformin.  Plans to work on weight lowering his intake

## 2019-05-18 NOTE — Assessment & Plan Note (Signed)
Restarted cigars with recent stressors of his mother'd death (which he feels he is coping with well)  Plans to decrease and stop

## 2019-05-18 NOTE — Assessment & Plan Note (Signed)
Bilateral.  Wonder if could be due to bilaterall compression sleeves.  He will try with these off.  Also likely due to weight gain.   No signs of ischemia or definite neuropathy

## 2019-05-19 ENCOUNTER — Other Ambulatory Visit: Payer: Self-pay | Admitting: *Deleted

## 2019-05-19 ENCOUNTER — Encounter: Payer: Self-pay | Admitting: Family Medicine

## 2019-05-19 DIAGNOSIS — I83812 Varicose veins of left lower extremities with pain: Secondary | ICD-10-CM

## 2019-05-19 LAB — CMP14+EGFR
ALT: 101 IU/L — ABNORMAL HIGH (ref 0–44)
AST: 54 IU/L — ABNORMAL HIGH (ref 0–40)
Albumin/Globulin Ratio: 1.4 (ref 1.2–2.2)
Albumin: 4.4 g/dL (ref 4.0–5.0)
Alkaline Phosphatase: 59 IU/L (ref 39–117)
BUN/Creatinine Ratio: 12 (ref 9–20)
BUN: 11 mg/dL (ref 6–24)
Bilirubin Total: 0.3 mg/dL (ref 0.0–1.2)
CO2: 28 mmol/L (ref 20–29)
Calcium: 9.5 mg/dL (ref 8.7–10.2)
Chloride: 101 mmol/L (ref 96–106)
Creatinine, Ser: 0.93 mg/dL (ref 0.76–1.27)
GFR calc Af Amer: 115 mL/min/{1.73_m2} (ref >59)
GFR calc non Af Amer: 99 mL/min/{1.73_m2} (ref >59)
Globulin, Total: 3.2 g/dL (ref 1.5–4.5)
Glucose: 100 mg/dL — ABNORMAL HIGH (ref 65–99)
Potassium: 4 mmol/L (ref 3.5–5.2)
Sodium: 139 mmol/L (ref 134–144)
Total Protein: 7.6 g/dL (ref 6.0–8.5)

## 2019-05-19 LAB — CBC
Hematocrit: 41.7 % (ref 37.5–51.0)
Hemoglobin: 13.2 g/dL (ref 13.0–17.7)
MCH: 23.6 pg — ABNORMAL LOW (ref 26.6–33.0)
MCHC: 31.7 g/dL (ref 31.5–35.7)
MCV: 75 fL — ABNORMAL LOW (ref 79–97)
Platelets: 296 10*3/uL (ref 150–450)
RBC: 5.6 x10E6/uL (ref 4.14–5.80)
RDW: 16.3 % — ABNORMAL HIGH (ref 11.6–15.4)
WBC: 6.4 10*3/uL (ref 3.4–10.8)

## 2019-05-26 LAB — SPECIMEN STATUS REPORT

## 2019-05-26 LAB — IMMUNOFIXATION, SERUM
IgA/Immunoglobulin A, Serum: 341 mg/dL (ref 90–386)
IgG (Immunoglobin G), Serum: 1648 mg/dL — ABNORMAL HIGH (ref 603–1613)
IgM (Immunoglobulin M), Srm: 40 mg/dL (ref 20–172)

## 2019-06-22 ENCOUNTER — Telehealth (HOSPITAL_COMMUNITY): Payer: Self-pay

## 2019-06-22 NOTE — Telephone Encounter (Signed)

## 2019-06-23 ENCOUNTER — Ambulatory Visit (INDEPENDENT_AMBULATORY_CARE_PROVIDER_SITE_OTHER): Payer: 59 | Admitting: Vascular Surgery

## 2019-06-23 ENCOUNTER — Encounter: Payer: Self-pay | Admitting: Vascular Surgery

## 2019-06-23 ENCOUNTER — Other Ambulatory Visit: Payer: Self-pay

## 2019-06-23 VITALS — BP 149/89 | HR 70 | Temp 97.5°F | Resp 16 | Ht >= 80 in | Wt 360.0 lb

## 2019-06-23 DIAGNOSIS — I83812 Varicose veins of left lower extremities with pain: Secondary | ICD-10-CM | POA: Diagnosis not present

## 2019-06-23 DIAGNOSIS — I872 Venous insufficiency (chronic) (peripheral): Secondary | ICD-10-CM

## 2019-06-23 HISTORY — PX: ENDOVENOUS ABLATION SAPHENOUS VEIN W/ LASER: SUR449

## 2019-06-23 NOTE — Progress Notes (Signed)
Patient name: William Franco MRN: [id number] DOB: 02-18-1976 Sex: male  REASON FOR VISIT: For endovenous laser ablation of the left great saphenous vein with 10-20 stabs  HPI: William Franco is a 44 y.o. male who I saw on 05/05/2019.  He had previously been seen by Dr. Trula Slade on 01/24/2019 with varicose veins.  He failed conservative treatment and was having significant symptoms related to his varicose veins.  On my exam he had a large cluster varicose veins in the posterior left distal thigh.  I looked at the great saphenous vein myself with the SonoSite and it was significantly dilated with reflux from the saphenofemoral junction to just above the knee.  There was 1 area in particular in the proximal thigh which was markedly dilated.  His formal duplex showed the diameter here was 1.02 cm.  I felt that he was a good candidate for laser ablation of the left great saphenous vein with 10-20 stabs.  Current Outpatient Medications  Medication Sig Dispense Refill  . albuterol (VENTOLIN HFA) 108 (90 Base) MCG/ACT inhaler Inhale 2 puffs into the lungs every 6 (six) hours as needed for wheezing. 18 g 2  . amLODipine (NORVASC) 10 MG tablet TAKE 1 TABLET BY MOUTH DAILY. 90 tablet 1  . hydrochlorothiazide (HYDRODIURIL) 25 MG tablet TAKE 1 TABLET BY MOUTH DAILY. 90 tablet 1  . ibuprofen (ADVIL,MOTRIN) 600 MG tablet Take 1 tablet (600 mg total) by mouth every 8 (eight) hours as needed. 30 tablet 0  . losartan (COZAAR) 100 MG tablet Take 1 tablet (100 mg total) by mouth at bedtime. TAKE 1 TABLET BY MOUTH DAILY. 90 tablet 2  . metFORMIN (GLUCOPHAGE) 500 MG tablet Take 1 tablet (500 mg total) by mouth 2 (two) times daily with a meal. 60 tablet 2  . Multiple Vitamin (MULTIVITAMIN WITH MINERALS) TABS tablet Take 1 tablet by mouth daily.    . sildenafil (VIAGRA) 100 MG tablet TAKE 1 TABLET BY MOUTH AS NEEDED FOR ERECTILE DYSFUNCTION. 5 tablet 11  . valACYclovir (VALTREX) 500 MG tablet TAKE 1 TABLET BY MOUTH  DAILY. 90 tablet 1   No current facility-administered medications for this visit.    PHYSICAL EXAM: Vitals:   06/23/19 1045  BP: (!) 149/89  Pulse: 70  Resp: 16  Temp: (!) 97.5 F (36.4 C)  TempSrc: Temporal  SpO2: 100%  Weight: (!) 360 lb (163.3 kg)  Height: 6\' 8"  (2.032 m)    MEDICAL ISSUES:  LASER ABLATION LEFT GREAT SAPHENOUS VEIN WITH 20 STAB PHLEBECTOMIES: Patient was taken to the exam room and the dilated veins were marked with the patient standing.  He was then placed supine.  Looked at the great saphenous vein it looks like I could cannulate this just above the knee.  Of note the patient is 6 feet 8 inches tall.  The left leg was prepped and draped in usual sterile fashion.  Under ultrasound guidance, after the skin was anesthetized, I cannulated the distal left great saphenous vein just above the knee and a guidewire was introduced.  The micropuncture sheath was introduced over the wire.  The J-wire was then advanced to just below the saphenofemoral junction through the micropuncture sheath.  A 65 cm sheath was then advanced over the wire and the dilator removed.  I positioned the end of the sheath approximately 3 cm distal to the saphenofemoral junction.  Tumescent anesthesia was administered circumferentially around the vein the entire length.  Patient was then placed in Trendelenburg.  Laser  ablation was then performed of the left great saphenous vein from 3 cm distal to the saphenofemoral junction to just above the knee.  2816 J of energy were used.  Next attention was turned to stab phlebectomies.  Tumescent anesthesia was administered over all the marked areas.  Using small stab incisions, approximately 20, the vein was hoped and then brought above the skin and then gently excised using blunt dissection using a hemostat.  Pressure was held for hemostasis.  Sterile dressing was applied.  The patient tolerated the procedure well.  He will return next week for a follow-up  duplex.  Deitra Mayo [facility] [place] [phone]

## 2019-06-23 NOTE — Progress Notes (Signed)
     Laser Ablation Procedure    Date: 06/23/2019   William Franco DOB:10-30-1975  Consent signed: Yes    Surgeon: Gae Gallop MD   Procedure: Laser Ablation: left Greater Saphenous Vein  BP (!) 149/89 (BP Location: Left Arm, Patient Position: Sitting, Cuff Size: Large)   Pulse 70   Temp (!) 97.5 F (36.4 C) (Temporal)   Resp 16   Ht 6\' 8"  (2.032 m)   Wt (!) 360 lb (163.3 kg)   SpO2 100%   BMI 39.55 kg/m   Tumescent Anesthesia: 700 cc 0.9% NaCl with 50 cc Lidocaine HCL 1%  and 15 cc 8.4% NaHCO3  Local Anesthesia: 20 cc Lidocaine HCL and NaHCO3 (ratio 2:1)  17 watts continuous mode        Total energy: 2816 Joules   Total time: 2:44  Laser Fiber Ref. # [id number]      Lot # [id number]   Stab Phlebectomy: 10-20 Sites: Thigh and Calf  Patient tolerated procedure well  Notes: Patient wore face mask.  All staff members wore facial masks and facial shields/goggles.    Description of Procedure:  After marking the course of the secondary varicosities, the patient was placed on the operating table in the supine position, and the left leg was prepped and draped in sterile fashion.   Local anesthetic was administered and under ultrasound guidance the saphenous vein was accessed with a micro needle and guide wire; then the mirco puncture sheath was placed.  A guide wire was inserted saphenofemoral junction , followed by a 5 french sheath.  The position of the sheath and then the laser fiber below the junction was confirmed using the ultrasound.  Tumescent anesthesia was administered along the course of the saphenous vein using ultrasound guidance. The patient was placed in Trendelenburg position and protective laser glasses were placed on patient and staff, and the laser was fired at 15 watts continuous mode advancing 1-59mm/second for a total of 2816 joules.   For stab phlebectomies, local anesthetic was administered at the previously marked varicosities, and tumescent anesthesia was  administered around the vessels.  Ten to 20 stab wounds were made using the tip of an 11 blade. And using the vein hook, the phlebectomies were performed using a hemostat to avulse the varicosities.  Adequate hemostasis was achieved.     Steri strips were applied to the stab wounds and ABD pads and thigh high compression stockings were applied.  Ace wrap bandages were applied over the phlebectomy sites and at the top of the saphenofemoral junction. Blood loss was less than 15 cc.  Discharge instructions reviewed with patient and hardcopy of discharge instructions given to patient to take home. The patient ambulated out of the operating room having tolerated the procedure well.

## 2019-06-24 DIAGNOSIS — I83812 Varicose veins of left lower extremities with pain: Secondary | ICD-10-CM

## 2019-06-29 ENCOUNTER — Telehealth (HOSPITAL_COMMUNITY): Payer: Self-pay

## 2019-06-29 NOTE — Telephone Encounter (Signed)

## 2019-06-30 ENCOUNTER — Other Ambulatory Visit: Payer: Self-pay

## 2019-06-30 ENCOUNTER — Ambulatory Visit (INDEPENDENT_AMBULATORY_CARE_PROVIDER_SITE_OTHER): Payer: Self-pay | Admitting: Vascular Surgery

## 2019-06-30 ENCOUNTER — Encounter: Payer: Self-pay | Admitting: Vascular Surgery

## 2019-06-30 ENCOUNTER — Ambulatory Visit (HOSPITAL_COMMUNITY)
Admission: RE | Admit: 2019-06-30 | Discharge: 2019-06-30 | Disposition: A | Discharge status: Home / Self Care | Payer: 59 | Source: Ambulatory Visit | Attending: Vascular Surgery | Admitting: Vascular Surgery

## 2019-06-30 VITALS — BP 144/90 | HR 65 | Temp 97.6°F | Resp 18 | Ht 79.0 in | Wt 366.0 lb

## 2019-06-30 DIAGNOSIS — Z48812 Encounter for surgical aftercare following surgery on the circulatory system: Secondary | ICD-10-CM

## 2019-06-30 DIAGNOSIS — I872 Venous insufficiency (chronic) (peripheral): Secondary | ICD-10-CM

## 2019-06-30 DIAGNOSIS — I83812 Varicose veins of left lower extremities with pain: Secondary | ICD-10-CM

## 2019-06-30 NOTE — Progress Notes (Signed)
Patient name: William Franco MRN: [id number] DOB: [birth year] Sex: male  REASON FOR VISIT: Follow-up after endovenous laser ablation of the left great saphenous vein with 10-20 stab phlebectomies  HPI: William Franco is a 44 y.o. male who had presented with large varicose veins of the left leg and chronic venous insufficiency.  He had failed conservative treatment and presented for laser ablation of the left great saphenous vein with 10-20 stabs.  On 06/23/2019 he underwent laser ablation of the left great saphenous vein from 3 cm distal to the saphenofemoral junction to just above the knee.  He also had approximately 20 stab phlebectomies.  He comes in for follow-up visit.  SYMPTOMS: He has had some aching pain in the leg as he has been back at work.  CONSERVATIVE TREATMENT: He continues to take ibuprofen as needed for pain and wear thigh-high stockings during the day.  VENOUS INTERVENTIONS: He has now undergone laser ablation of the left great saphenous vein.  We have no interventions planned on the right side.  Current Outpatient Medications  Medication Sig Dispense Refill  . albuterol (VENTOLIN HFA) 108 (90 Base) MCG/ACT inhaler Inhale 2 puffs into the lungs every 6 (six) hours as needed for wheezing. 18 g 2  . amLODipine (NORVASC) 10 MG tablet TAKE 1 TABLET BY MOUTH DAILY. 90 tablet 1  . hydrochlorothiazide (HYDRODIURIL) 25 MG tablet TAKE 1 TABLET BY MOUTH DAILY. 90 tablet 1  . ibuprofen (ADVIL,MOTRIN) 600 MG tablet Take 1 tablet (600 mg total) by mouth every 8 (eight) hours as needed. 30 tablet 0  . losartan (COZAAR) 100 MG tablet Take 1 tablet (100 mg total) by mouth at bedtime. TAKE 1 TABLET BY MOUTH DAILY. 90 tablet 2  . metFORMIN (GLUCOPHAGE) 500 MG tablet Take 1 tablet (500 mg total) by mouth 2 (two) times daily with a meal. 60 tablet 2  . Multiple Vitamin (MULTIVITAMIN WITH MINERALS) TABS tablet Take 1 tablet by mouth daily.    . sildenafil (VIAGRA) 100 MG tablet TAKE 1  TABLET BY MOUTH AS NEEDED FOR ERECTILE DYSFUNCTION. 5 tablet 11  . valACYclovir (VALTREX) 500 MG tablet TAKE 1 TABLET BY MOUTH DAILY. 90 tablet 1   No current facility-administered medications for this visit.   REVIEW OF SYSTEMS: Valu.Nieves ] denotes positive finding; [  ] denotes negative finding  CARDIOVASCULAR:  [ ]  chest pain   [ ]  dyspnea on exertion  [ ]  leg swelling  CONSTITUTIONAL:  [ ]  fever   [ ]  chills  PHYSICAL EXAM: Vitals:   06/30/19 1049  BP: (!) 144/90  Pulse: 65  Resp: 18  Temp: 97.6 F (36.4 C)  TempSrc: Temporal  SpO2: 99%  Weight: (!) 366 lb (166 kg)  Height: 6\' 7"  (2.007 m)    GENERAL: The patient is a well-nourished male, in no acute distress. The vital signs are documented above. CARDIOVASCULAR: There is a regular rate and rhythm. PULMONARY: There is good air exchange bilaterally without wheezing or rales. VASCULAR: He has no significant left leg swelling.  His incisions are healing nicely.  DATA:  VENOUS DUPLEX: I have independently interpreted his venous duplex scan today.  There is no evidence of DVT.  The left great saphenous vein is closed from the saphenofemoral junction to the knee.  MEDICAL ISSUES:  S/P LASER ABLATION LEFT GSV WITH STAB PHLEBECTOMIES: The patient is doing well status post laser ablation of the left great saphenous vein with approximately 20 stab phlebectomies.  He will continue to  wear his thigh-high stockings for another week.  At that point it may be more practical to wear knee-high 15-20 stockings.  I encouraged him to elevate his legs as much as he can at the end of the day as he is on his feet all day at work.  We will try to get him fitted for some tall stockings.  I will see him back as needed.  Obviously I see him at the hospital quite a bit if he has any issues he knows to let me know.  Deitra Mayo [facility] [place] [phone]

## 2019-07-07 ENCOUNTER — Encounter (HOSPITAL_COMMUNITY): Payer: 59

## 2019-07-07 ENCOUNTER — Ambulatory Visit: Payer: 59 | Admitting: Vascular Surgery

## 2019-07-11 ENCOUNTER — Other Ambulatory Visit: Payer: Self-pay | Admitting: Family Medicine

## 2019-07-11 DIAGNOSIS — I10 Essential (primary) hypertension: Secondary | ICD-10-CM

## 2019-07-11 MED FILL — AMLODIPINE BESYLATE 10 MG T: 10 | 90 days supply | Qty: 90 | Fill #0

## 2019-07-11 MED FILL — LOSARTAN POTASSIUM 100 MG T: 100 | 90 days supply | Qty: 90 | Fill #0

## 2019-07-11 MED FILL — HYDROCHLOROTHIAZIDE 25 MG T: 25 | 90 days supply | Qty: 90 | Fill #0

## 2019-08-09 ENCOUNTER — Encounter: Payer: Self-pay | Admitting: Family Medicine

## 2019-08-09 ENCOUNTER — Ambulatory Visit (INDEPENDENT_AMBULATORY_CARE_PROVIDER_SITE_OTHER): Payer: 59 | Admitting: Family Medicine

## 2019-08-09 ENCOUNTER — Other Ambulatory Visit: Payer: Self-pay

## 2019-08-09 VITALS — BP 140/70 | HR 71 | Ht >= 80 in | Wt 365.2 lb

## 2019-08-09 DIAGNOSIS — I10 Essential (primary) hypertension: Secondary | ICD-10-CM | POA: Diagnosis not present

## 2019-08-09 DIAGNOSIS — R200 Anesthesia of skin: Secondary | ICD-10-CM

## 2019-08-09 DIAGNOSIS — R7303 Prediabetes: Secondary | ICD-10-CM | POA: Diagnosis not present

## 2019-08-09 DIAGNOSIS — R109 Unspecified abdominal pain: Secondary | ICD-10-CM | POA: Diagnosis not present

## 2019-08-09 DIAGNOSIS — R202 Paresthesia of skin: Secondary | ICD-10-CM | POA: Diagnosis not present

## 2019-08-09 LAB — POCT GLYCOSYLATED HEMOGLOBIN (HGB A1C): HbA1c, POC (controlled diabetic range): 6 % (ref 0.0–7.0)

## 2019-08-09 NOTE — Assessment & Plan Note (Signed)
Not controlled.  Discussed why needs to start metformin once his stomach upset is resolved

## 2019-08-09 NOTE — Assessment & Plan Note (Signed)
Seems most consistent with mild gastroenteritis.  No red flags for infection or obstruction.  Trial of peptobismol and follow

## 2019-08-09 NOTE — Assessment & Plan Note (Signed)
Resolved

## 2019-08-09 NOTE — Progress Notes (Signed)
    SUBJECTIVE:   CHIEF COMPLAINT / HPI:   ABDOMINAL DISCOMFORT For the last week or so has felt gassy and distended with mild pain.  Ate a lot of hot peppers just before started.  No nausea and vomiting or bleeding.  Slighlty irregular bowel movements.  No fever.  Normal eating  PREDIABETES Has not started metformin.  Trying to eat better and exercise.     FEET NUMBNESS Resolved once stopped wearing compression stockings after he had vein surgery  PERTINENT  PMH / PSH: Cigar smoking   OBJECTIVE:   BP 140/70   Pulse 71   Ht 6\' 8"  (2.032 m)   Wt (!) 365 lb 3.2 oz (165.7 kg)   SpO2 99%   BMI 40.12 kg/m   Abdomen: soft and non-tender without masses, organomegaly or hernias noted.  No guarding or rebound No CVAT  ASSESSMENT/PLAN:   Numbness and tingling of foot Resolved   Prediabetes Not controlled.  Discussed why needs to start metformin once his stomach upset is resolved  Pain in the abdomen Seems most consistent with mild gastroenteritis.  No red flags for infection or obstruction.  Trial of peptobismol and follow      Lind Covert, MD [place]

## 2019-08-09 NOTE — Patient Instructions (Addendum)
Good to see you today!  Thanks for coming in.  For the stomach pain - try the pepto bismol 2 tabs twice a day for 4 days.  If the pain gets a lot worse or any bleeding then call me or MyChart  For the liver tests - will recheck today  For the diabetes - will check blood sugar today and would start the metformin 1/2 tab build up slowly once the stomach is back to normal after a week   Come back after you have taken the metformin for 3 months  Aim to lose about 2 lbs a week

## 2019-08-10 ENCOUNTER — Encounter: Payer: Self-pay | Admitting: Family Medicine

## 2019-08-10 LAB — CMP14+EGFR
ALT: 57 IU/L — ABNORMAL HIGH (ref 0–44)
AST: 32 IU/L (ref 0–40)
Albumin/Globulin Ratio: 1.3 (ref 1.2–2.2)
Albumin: 4.4 g/dL (ref 4.0–5.0)
Alkaline Phosphatase: 56 IU/L (ref 39–117)
BUN/Creatinine Ratio: 11 (ref 9–20)
BUN: 13 mg/dL (ref 6–24)
Bilirubin Total: 0.3 mg/dL (ref 0.0–1.2)
CO2: 25 mmol/L (ref 20–29)
Calcium: 9.3 mg/dL (ref 8.7–10.2)
Chloride: 103 mmol/L (ref 96–106)
Creatinine, Ser: 1.14 mg/dL (ref 0.76–1.27)
GFR calc Af Amer: 90 mL/min/{1.73_m2} (ref >59)
GFR calc non Af Amer: 78 mL/min/{1.73_m2} (ref >59)
Globulin, Total: 3.3 g/dL (ref 1.5–4.5)
Glucose: 112 mg/dL — ABNORMAL HIGH (ref 65–99)
Potassium: 3.9 mmol/L (ref 3.5–5.2)
Sodium: 140 mmol/L (ref 134–144)
Total Protein: 7.7 g/dL (ref 6.0–8.5)

## 2019-09-01 MED FILL — LOSARTAN POTASSIUM 100 MG T: 100 | 30 days supply | Qty: 30 | Fill #1

## 2019-09-01 MED FILL — HYDROCHLOROTHIAZIDE 25 MG T: 25 | 90 days supply | Qty: 90 | Fill #1

## 2019-09-08 ENCOUNTER — Encounter: Payer: Self-pay | Admitting: Family Medicine

## 2019-09-21 ENCOUNTER — Encounter: Payer: Self-pay | Admitting: Family Medicine

## 2019-10-04 ENCOUNTER — Ambulatory Visit (INDEPENDENT_AMBULATORY_CARE_PROVIDER_SITE_OTHER): Payer: 59 | Admitting: Family Medicine

## 2019-10-04 ENCOUNTER — Other Ambulatory Visit: Payer: Self-pay

## 2019-10-04 ENCOUNTER — Encounter: Payer: Self-pay | Admitting: Family Medicine

## 2019-10-04 VITALS — BP 136/82 | HR 88 | Ht >= 80 in | Wt 355.0 lb

## 2019-10-04 DIAGNOSIS — R109 Unspecified abdominal pain: Secondary | ICD-10-CM | POA: Diagnosis not present

## 2019-10-04 LAB — POCT URINALYSIS DIP (MANUAL ENTRY)
Bilirubin, UA: NEGATIVE
Blood, UA: NEGATIVE
Glucose, UA: NEGATIVE mg/dL
Ketones, POC UA: NEGATIVE mg/dL
Leukocytes, UA: NEGATIVE
Nitrite, UA: NEGATIVE
Protein Ur, POC: 30 mg/dL — AB
Spec Grav, UA: >=1.03 — AB (ref 1.010–1.025)
Urobilinogen, UA: 0.2 E.U./dL
pH, UA: 6 (ref 5.0–8.0)

## 2019-10-04 LAB — POCT UA - MICROSCOPIC ONLY

## 2019-10-04 MED ORDER — HYOSCYAMINE SULFATE 0.125 MG PO TABS: 0.1250 mg | ORAL_TABLET | ORAL | 1 refills | Status: DC | PRN

## 2019-10-04 NOTE — Assessment & Plan Note (Signed)
Changing.  Most consistent with intestinal spasm given short duration and location.  Will check UA given penile sensations.  No hernia felt.   May be due to diet with increased veggies and stress with his girlfriends illness.  Trial of Levsin and monitor.  Given change in colon cancer screening will discuss referral for colonoscopy

## 2019-10-04 NOTE — Progress Notes (Signed)
° ° °  SUBJECTIVE:   CHIEF COMPLAINT / HPI:   ABDOMEN GROIN PAIN His pain has changed from upper nearly constant to lower episodes of sudden pain which then goes away.  Usually in RLQ sometimes in his groin penis area.  Pains last for seconds.  He has noticed more gas and sometimes more bowel movements.  No bleeding or pain with urination or defecation or bulges or any nausea and vomiting.  No penile lesons or discharge.  Is eating more veggies trying to be healthy and lose weight.  No longer taking omeprazole.  Never started metformin  Under a lot of stress with girlfriend who is on TPN   PERTINENT  PMH / PSH: prediabetes   OBJECTIVE:   BP 136/82    Pulse 88    Ht 6\' 8"  (2.032 m)    Wt (!) 355 lb (161 kg)    SpO2 97%    BMI 39.00 kg/m  . Abdomen - mildly tender in RLQ.  No guarding rebound masses or HSM. Normal penis and testicles.  No masses with valsalva  No CVAT  ASSESSMENT/PLAN:   Pain in the abdomen Changing.  Most consistent with intestinal spasm given short duration and location.  Will check UA given penile sensations.  No hernia felt.   May be due to diet with increased veggies and stress with his girlfriends illness.  Trial of Levsin and monitor.  Given change in colon cancer screening will discuss referral for colonoscopy      Lind Covert, MD [place]

## 2019-10-04 NOTE — Patient Instructions (Signed)
Good to see you today!  Thanks for coming in.  For the abdomen spasms  Try Levsin pill if you are having a lot of spasms.  Can take every 4 hours  If any worsening pain or fever or bleeding let me know right away  Continue your current diet  I will let you know about your urine  Come back in one month

## 2019-10-05 ENCOUNTER — Encounter: Payer: Self-pay | Admitting: Family Medicine

## 2019-11-15 ENCOUNTER — Other Ambulatory Visit: Payer: Self-pay

## 2019-11-15 ENCOUNTER — Encounter: Payer: Self-pay | Admitting: Family Medicine

## 2019-11-15 ENCOUNTER — Ambulatory Visit (INDEPENDENT_AMBULATORY_CARE_PROVIDER_SITE_OTHER): Payer: 59 | Admitting: Family Medicine

## 2019-11-15 VITALS — BP 140/92 | HR 74 | Ht >= 80 in | Wt 355.4 lb

## 2019-11-15 DIAGNOSIS — I10 Essential (primary) hypertension: Secondary | ICD-10-CM

## 2019-11-15 DIAGNOSIS — R7303 Prediabetes: Secondary | ICD-10-CM

## 2019-11-15 DIAGNOSIS — R109 Unspecified abdominal pain: Secondary | ICD-10-CM | POA: Diagnosis not present

## 2019-11-15 LAB — POCT GLYCOSYLATED HEMOGLOBIN (HGB A1C): HbA1c, POC (controlled diabetic range): 5.7 % (ref 0.0–7.0)

## 2019-11-15 NOTE — Progress Notes (Addendum)
    SUBJECTIVE:   HPI:    Abdominal pain  Patient reports chronic, intermittent abdominal pain that started in January has improved significantly on the Levsin, reporting the pain now at 1/10. Pain remains predominantly on RLQ, sometimes in groin or LLQ; radiates to back. Additionally, sensation of gassiness, and increased need for defecation correlating with the abdominal spasms remain  (poop is well formed). Takes Levsin as needed. Reports rebound in pain with two week trial of discontinuation. Last took Levsin five days ago. Endorses pain improves with exercise; and worsens after large meal or when hungry. Denies bleeding, pain with urination, n/v.     HTN Patient has not been taking losartan (out).  Pre-diabetes Not on metformin. Goes for long walks over the weekend, little exercise over the week. A1C improved--at 5.7 today.  PERTINENT  PMH / PSH: Patient's GF still on TPN, pt reports reduction in worries regarding this.  OBJECTIVE:   BP (!) 140/92   Pulse 74   Ht 6\' 8"  (2.032 m)   Wt (!) 355 lb 6.4 oz (161.2 kg)   SpO2 96%   BMI 39.04 kg/m   General: Well appearing, No acute distress. Pleasant and cooperative.  Abdominal: Soft, obese, absent organomegaly. Not tender to light or deep palpation. GU: No CVA tenderness   ASSESSMENT/PLAN:   Pain in the abdomen - unsure of cause.  Patient would like GI referral as he is concerned -GI referral -try not using Levsin  Prediabetes Well controlled with weight and exercise management. Never started metformin   Hypertension BP Readings from Last 3 Encounters:  11/15/19 (!) 140/92  10/04/19 136/82  08/09/19 140/70   Mildly elevated today likely due to being out of losartan other wise good control   Lind Covert, MD [place]

## 2019-11-15 NOTE — Patient Instructions (Signed)
Good to see you today - Thanks for coming in  Abdomen pain - GI should be in contact in the next 2 weeks.  If you dont hear from them call me - Try not using the Levsin   For the BP Keep taking all 3 medications regularly  For the blood sugar Your A!c is doing great   Get back into a regular exercise routine  Please always bring your medication bottles  Come back to see me in 3 months

## 2019-11-16 NOTE — Assessment & Plan Note (Signed)
BP Readings from Last 3 Encounters:  11/15/19 (!) 140/92  10/04/19 136/82  08/09/19 140/70   Mildly elevated today likely due to being out of losartan other wise good control

## 2019-11-16 NOTE — Assessment & Plan Note (Signed)
-   unsure of cause.  Patient would like GI referral as he is concerned -GI referral -try not using Levsin

## 2019-11-16 NOTE — Assessment & Plan Note (Signed)
Well controlled with weight and exercise management. Never started metformin

## 2019-11-17 ENCOUNTER — Other Ambulatory Visit (HOSPITAL_COMMUNITY): Payer: Self-pay | Admitting: Family Medicine

## 2019-11-17 ENCOUNTER — Other Ambulatory Visit: Payer: Self-pay | Admitting: Family Medicine

## 2019-11-17 MED FILL — LOSARTAN POTASSIUM 100 MG T: 100 | 30 days supply | Qty: 30 | Fill #2

## 2019-11-17 MED FILL — AMLODIPINE BESYLATE 10 MG T: 10 | 90 days supply | Qty: 90 | Fill #1

## 2019-11-17 MED FILL — HYDROCHLOROTHIAZIDE 25 MG T: 25 | 90 days supply | Qty: 90 | Fill #0

## 2019-11-30 ENCOUNTER — Encounter: Payer: Self-pay | Admitting: Nurse Practitioner

## 2019-12-05 MED FILL — OSCIMIN 0.125 MG TABLET: 0.125 | 5 days supply | Qty: 30 | Fill #1

## 2019-12-07 ENCOUNTER — Other Ambulatory Visit: Payer: Self-pay | Admitting: Family Medicine

## 2019-12-08 ENCOUNTER — Other Ambulatory Visit (HOSPITAL_COMMUNITY): Payer: Self-pay | Admitting: Family Medicine

## 2019-12-08 MED FILL — ALBUTEROL SULFATE HFA 108 (: 108 (90 BAS | 25 days supply | Qty: 18 | Fill #0

## 2020-01-06 ENCOUNTER — Encounter: Payer: Self-pay | Admitting: Nurse Practitioner

## 2020-01-06 ENCOUNTER — Other Ambulatory Visit (INDEPENDENT_AMBULATORY_CARE_PROVIDER_SITE_OTHER): Payer: 59

## 2020-01-06 ENCOUNTER — Ambulatory Visit: Payer: 59 | Admitting: Nurse Practitioner

## 2020-01-06 VITALS — BP 138/88 | HR 76 | Ht >= 80 in | Wt 356.8 lb

## 2020-01-06 DIAGNOSIS — K5900 Constipation, unspecified: Secondary | ICD-10-CM

## 2020-01-06 DIAGNOSIS — R109 Unspecified abdominal pain: Secondary | ICD-10-CM

## 2020-01-06 DIAGNOSIS — R1032 Left lower quadrant pain: Secondary | ICD-10-CM

## 2020-01-06 DIAGNOSIS — R7989 Other specified abnormal findings of blood chemistry: Secondary | ICD-10-CM

## 2020-01-06 DIAGNOSIS — R1031 Right lower quadrant pain: Secondary | ICD-10-CM | POA: Diagnosis not present

## 2020-01-06 LAB — CBC WITH DIFFERENTIAL/PLATELET
Basophils Absolute: 0 10*3/uL (ref 0.0–0.1)
Basophils Relative: 1 % (ref 0.0–3.0)
Eosinophils Absolute: 0.2 10*3/uL (ref 0.0–0.7)
Eosinophils Relative: 3.3 % (ref 0.0–5.0)
HCT: 38.7 % — ABNORMAL LOW (ref 39.0–52.0)
Hemoglobin: 12.3 g/dL — ABNORMAL LOW (ref 13.0–17.0)
Lymphocytes Relative: 41.4 % (ref 12.0–46.0)
Lymphs Abs: 2 10*3/uL (ref 0.7–4.0)
MCHC: 31.9 g/dL (ref 30.0–36.0)
MCV: 72.7 fl — ABNORMAL LOW (ref 78.0–100.0)
Monocytes Absolute: 0.5 10*3/uL (ref 0.1–1.0)
Monocytes Relative: 10.2 % (ref 3.0–12.0)
Neutro Abs: 2.1 10*3/uL (ref 1.4–7.7)
Neutrophils Relative %: 44.1 % (ref 43.0–77.0)
Platelets: 287 10*3/uL (ref 150.0–400.0)
RBC: 5.32 Mil/uL (ref 4.22–5.81)
RDW: 16 % — ABNORMAL HIGH (ref 11.5–15.5)
WBC: 4.7 10*3/uL (ref 4.0–10.5)

## 2020-01-06 LAB — COMPREHENSIVE METABOLIC PANEL
ALT: 41 U/L (ref 0–53)
AST: 27 U/L (ref 0–37)
Albumin: 4.3 g/dL (ref 3.5–5.2)
Alkaline Phosphatase: 46 U/L (ref 39–117)
BUN: 13 mg/dL (ref 6–23)
CO2: 30 mEq/L (ref 19–32)
Calcium: 9.2 mg/dL (ref 8.4–10.5)
Chloride: 105 mEq/L (ref 96–112)
Creatinine, Ser: 0.97 mg/dL (ref 0.40–1.50)
GFR: 101.32 mL/min (ref >60.00)
Glucose, Bld: 103 mg/dL — ABNORMAL HIGH (ref 70–99)
Potassium: 3.7 mEq/L (ref 3.5–5.1)
Sodium: 139 mEq/L (ref 135–145)
Total Bilirubin: 0.4 mg/dL (ref 0.2–1.2)
Total Protein: 7.6 g/dL (ref 6.0–8.3)

## 2020-01-06 LAB — IGA: IgA: 316 mg/dL (ref 68–378)

## 2020-01-06 LAB — C-REACTIVE PROTEIN: CRP: <1 mg/dL (ref 0.5–20.0)

## 2020-01-06 NOTE — Progress Notes (Signed)
01/06/2020 William Franco [id number] 1975/06/25   CHIEF COMPLAINT: Abdominal pain   HISTORY OF PRESENT ILLNESS:  William Franco is a 44 year old male with a past medical history of hypertension, pre-diabetes, kidney stones and spinal stenosis. S/P saphenous vein ablation 05/2019 and back surgery. He presents to our office today a referred by Dr. Erin Hearing for further evaluation for abdominal pain. He complains of having "stomach issues" since the beginning of the year. He complain of having right and left sided abdominal pains described as sharp R > L which comes and goes and lasts for a few seconds. No specific food triggers. His pain worsens if he eats a larger meal. He has intermittent pain to his right and left groin area. No nausea or vomiting. He was prescribed Hyoscyamine which reduced is abdominal discomfort but resulted in more constipation. He is concerned regarding the cause of his pain. He has abdominal bloat, gas and alternating diarrhea and constipation. He passes 3 to 4 nonbloody loose stools x 7 days then passes a normal formed brown BM for a few days then no BM for a few days. He occasionally takes Pepto bismol. No recent antibiotics. No weight loss. No fever, sweats or chills. No Heartburn or dysphagia. Occasionally takes Ibuprofen for aches and pains.   Past Medical History:  Diagnosis Date  . Hypertension   . Kidney stones   . Spinal stenosis, lumbar region, 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   Past Surgical History:  Procedure Laterality Date  . BACK SURGERY    . ENDOVENOUS ABLATION SAPHENOUS VEIN W/ LASER Left 06/23/2019   endovenous laser ablation left greater saphenous vein and stab phlebectomy 10-20 incisions left leg by Gae Gallop MD   . none     Social History: He smokes cigars one daily 15 to 20 years. He drinks 2 mixed drinks or wine or beer once or twice weekly. No drug use.   Family History: Father age 60 with HTN. Mother died age 16 from  ovarian cancer. Sister age 50 with a "skin condition".    Allergies  Allergen Reactions  . Bee Venom Swelling      Outpatient Encounter Medications as of 01/06/2020  Medication Sig  . albuterol (VENTOLIN HFA) 108 (90 Base) MCG/ACT inhaler INHALE 2 PUFFS BY MOUTH INTO THE LUNGS EVERY 6 HOURS AS NEEDED FOR WHEEZING  . amLODipine (NORVASC) 10 MG tablet TAKE 1 TABLET BY MOUTH DAILY.  . hydrochlorothiazide (HYDRODIURIL) 25 MG tablet TAKE 1 TABLET BY MOUTH DAILY.  . hyoscyamine (OSCIMIN) 0.125 MG TBDP disintergrating tablet Place 0.125 mg under the tongue every 4 (four) hours as needed.  Marland Kitchen ibuprofen (ADVIL,MOTRIN) 600 MG tablet Take 1 tablet (600 mg total) by mouth every 8 (eight) hours as needed.  Marland Kitchen losartan (COZAAR) 100 MG tablet TAKE 1 TABLET (100 MG TOTAL) BY MOUTH AT BEDTIME. TAKE 1 TABLET BY MOUTH DAILY.  . Multiple Vitamin (MULTIVITAMIN WITH MINERALS) TABS tablet Take 1 tablet by mouth daily.  . sildenafil (VIAGRA) 100 MG tablet TAKE 1 TABLET BY MOUTH AS NEEDED FOR ERECTILE DYSFUNCTION.  . valACYclovir (VALTREX) 500 MG tablet TAKE 1 TABLET BY MOUTH DAILY.  . [DISCONTINUED] hyoscyamine (LEVSIN) 0.125 MG tablet Take 1 tablet (0.125 mg total) by mouth every 4 (four) hours as needed.   No facility-administered encounter medications on file as of 01/06/2020.     REVIEW OF SYSTEMS:  Gen: Denies fever, sweats or chills. No weight loss.  CV: Denies chest pain, palpitations or edema.  Resp: Denies cough, shortness of breath of hemoptysis.  GI: See HPI.   GU : Denies urinary burning, blood in urine, increased urinary frequency or incontinence. MS: + back pain. No muscles aches or weakness. Derm: Denies rash, itchiness, skin lesions or unhealing ulcers. Psych: Denies depression, anxiety or memory losso. Heme: Denies bruising, bleeding. Neuro:  Denies headaches, dizziness or paresthesias. Endo:  Denies any problems with DM, thyroid or adrenal function.    PHYSICAL EXAM: BP 138/88   Pulse  76   Ht 6\' 8"  (2.032 m)   Wt (!) 356 lb 12.8 oz (161.8 kg)   BMI 39.20 kg/m    General: Well developed 44 year old male in no acute distress. Head: Normocephalic and atraumatic. Eyes:  Sclerae non-icteric, conjunctive pink. Ears: Normal auditory acuity. Mouth: Dentition intact. No ulcers or lesions.  Neck: Supple, no lymphadenopathy or thyromegaly.  Lungs: Clear bilaterally to auscultation without wheezes, crackles or rhonchi. Heart: Regular rate and rhythm. No murmur, rub or gallop appreciated.  Abdomen: Soft, non distended. Generalized tenderness, RLQ tenderness without rebound or guarding. No CVA tenderness. No masses. No hepatosplenomegaly. Normoactive bowel sounds x 4 quadrants.  Rectal: Deferred. Musculoskeletal: Symmetrical with no gross deformities. Skin: Warm and dry. No rash or lesions on visible extremities. Extremities: No edema. Neurological: Alert oriented x 4, no focal deficits.  Psychological:  Alert and cooperative. Normal mood and affect.  ASSESSMENT AND PLAN:  85. 44 year old male with generalized abdominal pain, abdominal gas and bloat. -CBC, CMP, CRP, TTG, IgA -CTAP with oral and IV contrast  -Follow up with Dr. Tarri Glenn in 1 month -Patient to call our office if his symptoms worsen  2. IBS symptoms, alternating diarrhea with more constipation -Benefiber 1 tbsp daily if tolerated  -Miralax Q HS as needed for constipation/increase stool output    CC:  Chambliss, Jeb Levering, *

## 2020-01-06 NOTE — Patient Instructions (Signed)
If you are age 45 or older, your body mass index should be between 23-30. Your Body mass index is 39.2 kg/m. If this is out of the aforementioned range listed, please consider follow up with your Primary Care Provider.  If you are age 1 or younger, your body mass index should be between 19-25. Your Body mass index is 39.2 kg/m. If this is out of the aformentioned range listed, please consider follow up with your Primary Care Provider.   Your provider has requested that you go to the basement level for lab work before leaving today. Press "B" on the elevator. The lab is located at the first door on the left as you exit the elevator.  You have been scheduled for a CT scan of the abdomen and pelvis at [facility] ([address]---this is in the same building as Press [facility]).   You are scheduled on 01/12/2020 at 230pm. You should arrive 15 minutes prior to your appointment time for registration. Please follow the written instructions below on the day of your exam:  WARNING: IF YOU ARE ALLERGIC TO IODINE/X-RAY DYE, PLEASE NOTIFY RADIOLOGY IMMEDIATELY AT [phone]! YOU WILL BE GIVEN A 13 HOUR PREMEDICATION PREP.  1) Do not eat or drink anything after 10:30am (4 hours prior to your test) 2) You have been given 2 bottles of oral contrast to drink. The solution may taste better if refrigerated, but do NOT add ice or any other liquid to this solution. Shake well before drinking.    Drink 1 bottle of contrast @ 12:30pm (2 hours prior to your exam)  Drink 1 bottle of contrast @ 1:30pm (1 hour prior to your exam)  You may take any medications as prescribed with a small amount of water, if necessary. If you take any of the following medications: METFORMIN, GLUCOPHAGE, GLUCOVANCE, AVANDAMET, RIOMET, FORTAMET, [place] MET, JANUMET, GLUMETZA or METAGLIP, you MAY be asked to HOLD this medication 48 hours AFTER the exam.  The purpose of you drinking the oral contrast is to aid in the  visualization of your intestinal tract. The contrast solution may cause some diarrhea. Depending on your individual set of symptoms, you may also receive an intravenous injection of x-ray contrast/dye. Plan on being at [facility] for 30 minutes or longer, depending on the type of exam you are having performed.  This test typically takes 30-45 minutes to complete.  If you have any questions regarding your exam or if you need to reschedule, you may call the CT department at [phone] between the hours of 8:00 am and 5:00 pm, Monday-Friday.  ________________________________________________________________________  Take Miralax 1 capful mixed in 8 ounces of water at bed time for constipation as tolerated.  Follow up with Dr Tarri Glenn in 1 month  Call the office if your symptoms worsen.  Due to recent changes in healthcare laws, you may see the results of your imaging and laboratory studies on MyChart before your provider has had a chance to review them.  We understand that in some cases there may be results that are confusing or concerning to you. Not all laboratory results come back in the same time frame and the provider may be waiting for multiple results in order to interpret others.  Please give [place] 48 hours in order for your provider to thoroughly review all the results before contacting the office for clarification of your results.

## 2020-01-09 ENCOUNTER — Other Ambulatory Visit: Payer: Self-pay

## 2020-01-09 DIAGNOSIS — D508 Other iron deficiency anemias: Secondary | ICD-10-CM

## 2020-01-09 LAB — TISSUE TRANSGLUTAMINASE, IGA: (tTG) Ab, IgA: <1 U/mL

## 2020-01-09 NOTE — Progress Notes (Signed)
Reviewed and agree with management plans. ? ?Royalty Domagala L. Tristan Bramble, MD, MPH  ?

## 2020-01-12 ENCOUNTER — Other Ambulatory Visit: Payer: Self-pay

## 2020-01-12 ENCOUNTER — Ambulatory Visit (INDEPENDENT_AMBULATORY_CARE_PROVIDER_SITE_OTHER)
Admission: RE | Admit: 2020-01-12 | Discharge: 2020-01-12 | Disposition: A | Discharge status: Home / Self Care | Payer: 59 | Source: Ambulatory Visit | Attending: Nurse Practitioner | Admitting: Nurse Practitioner

## 2020-01-12 DIAGNOSIS — K5900 Constipation, unspecified: Secondary | ICD-10-CM

## 2020-01-12 DIAGNOSIS — R109 Unspecified abdominal pain: Secondary | ICD-10-CM

## 2020-01-12 DIAGNOSIS — R1032 Left lower quadrant pain: Secondary | ICD-10-CM

## 2020-01-12 DIAGNOSIS — R7989 Other specified abnormal findings of blood chemistry: Secondary | ICD-10-CM

## 2020-01-12 DIAGNOSIS — R1031 Right lower quadrant pain: Secondary | ICD-10-CM

## 2020-01-12 DIAGNOSIS — M5136 Other intervertebral disc degeneration, lumbar region: Secondary | ICD-10-CM | POA: Diagnosis not present

## 2020-01-12 MED ORDER — IOHEXOL 300 MG/ML  SOLN
100.0000 mL | Freq: Once | INTRAMUSCULAR | Status: AC | PRN
  Administered 2020-01-12: 100 mL via INTRAVENOUS

## 2020-02-03 ENCOUNTER — Encounter: Payer: Self-pay | Admitting: Family Medicine

## 2020-02-09 ENCOUNTER — Other Ambulatory Visit: Payer: Self-pay | Admitting: Family Medicine

## 2020-02-09 ENCOUNTER — Other Ambulatory Visit (HOSPITAL_COMMUNITY): Payer: Self-pay | Admitting: Family Medicine

## 2020-02-09 MED FILL — SILDENAFIL CITRATE 100 MG T: 100 | 28 days supply | Qty: 5 | Fill #1

## 2020-02-09 MED FILL — ALBUTEROL SULFATE HFA 108 (: 108 (90 BAS | 25 days supply | Qty: 18 | Fill #0

## 2020-02-09 MED FILL — LOSARTAN POTASSIUM 100 MG T: 100 | 30 days supply | Qty: 30 | Fill #3

## 2020-02-09 MED FILL — VALACYCLOVIR HCL 500 MG TAB: 500 | 90 days supply | Qty: 90 | Fill #0

## 2020-02-21 ENCOUNTER — Other Ambulatory Visit (INDEPENDENT_AMBULATORY_CARE_PROVIDER_SITE_OTHER): Payer: 59

## 2020-02-21 ENCOUNTER — Encounter: Payer: Self-pay | Admitting: Gastroenterology

## 2020-02-21 ENCOUNTER — Ambulatory Visit: Payer: 59 | Admitting: Gastroenterology

## 2020-02-21 ENCOUNTER — Other Ambulatory Visit (HOSPITAL_COMMUNITY): Payer: Self-pay | Admitting: Gastroenterology

## 2020-02-21 VITALS — BP 138/80 | HR 74 | Ht >= 80 in | Wt 356.0 lb

## 2020-02-21 DIAGNOSIS — R718 Other abnormality of red blood cells: Secondary | ICD-10-CM | POA: Diagnosis not present

## 2020-02-21 DIAGNOSIS — R1032 Left lower quadrant pain: Secondary | ICD-10-CM

## 2020-02-21 LAB — IRON: Iron: 80 ug/dL (ref 42–165)

## 2020-02-21 LAB — FERRITIN: Ferritin: 156.6 ng/mL (ref 22.0–322.0)

## 2020-02-21 MED ORDER — NA SULFATE-K SULFATE-MG SULF 17.5-3.13-1.6 GM/177ML PO SOLN: 1.0000 | Freq: Once | ORAL | 0 refills | Status: AC

## 2020-02-21 MED ORDER — HYOSCYAMINE SULFATE 0.125 MG PO TBDP
0.1250 mg | ORAL_TABLET | ORAL | 0 refills | Status: DC | PRN
Start: 2020-02-21 — End: 2021-04-02

## 2020-02-21 MED FILL — SUPREP BOWEL PREP KIT: 17.5-3.13-1 | 1 days supply | Qty: 354 | Fill #0

## 2020-02-21 MED FILL — HYOSCYAMINE 0.125 MG ODT: 0.125 | 5 days supply | Qty: 30 | Fill #0

## 2020-02-21 NOTE — Patient Instructions (Addendum)
Normal BMI (Body Mass Index- based on height and weight) is between 19 and 25. Your BMI today is Body mass index is 39.11 kg/m. Marland Kitchen Please consider follow up  regarding your BMI with your Primary Care Provider.  Your provider has requested that you go to the basement level for lab work before leaving today. Press "B" on the elevator. The lab is located at the first door on the left as you exit the elevator.  Due to recent changes in healthcare laws, you may see the results of your imaging and laboratory studies on MyChart before your provider has had a chance to review them.  We understand that in some cases there may be results that are confusing or concerning to you. Not all laboratory results come back in the same time frame and the provider may be waiting for multiple results in order to interpret others.  Please give [place] 48 hours in order for your provider to thoroughly review all the results before contacting the office for clarification of your results.   Following medication has been sent to your pharmacy:   Hyoscyamine  Please continue Hyoscyamine and Tylenol  You have been scheduled for a colonoscopy. Please follow written instructions given to you at your visit today.  Please pick up your prep supplies at the pharmacy within the next 1-3 days. If you use inhalers (even only as needed), please bring them with you on the day of your procedure.  Thank you for trusting me with your gastrointestinal care!    Thornton Park, MD, MPH

## 2020-02-21 NOTE — Progress Notes (Signed)
Referring Provider: Lind Covert, * Primary Care Physician:  Lind Covert, MD  Chief complaint:  LLQ pain   IMPRESSION:  LLQ pain with alternating diarrhea and constipation    - not explained by contrasted CT scan    - TTGA and IgA normal    - CRP normal    - unusual features with associated penile pain Microcytosis without anemia    - labs today to evaluate for iron deficiency Intermittent rectal bleeding attributed to hemorrhoids No prior colon cancer screening No family history of colon cancer or polyps  PLAN: Continue to use hyoscyamine and acetaminophen  Iron,TIBC, ferritin Colonoscopy to evaluate the pain and screen for colon cancer Follow-up with Dr. Erin Hearing re: penile pain   Please see the "Patient Instructions" section for addition details about the plan.  HPI: William Franco is a 44 y.o. male who returns in scheduled follow-up. 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  Seen in consultation 01/06/20 by Carl Best generalized abdominal pain, abdominal gas and bloat.   Evaluation has included: Normal CMP, CRP except for an MCV of 75, RDW 16.3 Normal TTGA and IgA.  CT abd/pelvis with contrast showed no source for abdominal pain  Returns today in scheduled follow-up. Symptoms have somewhat improved but are not resolved. Continues to have pain in his penis and groin. Occurs intermittently and is improved by defecation.  Some improvement with hyoscyamine. Alternating diarrhea and constipation. He has had a stressful year his mother died, his girlf friend was seriously ill, Covid challenges in the Covid in the OR.  He finds the improvement in symptoms correlates with his girlfriends improvement.   No overt bleeding except for hemorrhoids.   No known family history of colon cancer or polyps. No family history of uterine/endometrial cancer, pancreatic cancer or gastric/stomach cancer.   Past Medical History:  Diagnosis Date  . Hypertension   .  Kidney stones   . 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 BACK SURGERY    . ENDOVENOUS ABLATION SAPHENOUS VEIN W/ LASER Left 06/23/2019   endovenous laser ablation left greater saphenous vein and stab phlebectomy 10-20 incisions left leg by Gae Gallop MD     Current Outpatient Medications  Medication Sig Dispense Refill  . albuterol (VENTOLIN HFA) 108 (90 Base) MCG/ACT inhaler INHALE 2 PUFFS BY MOUTH INTO THE LUNGS EVERY 6 HOURS AS NEEDED FOR WHEEZING 18 g 2  . amLODipine (NORVASC) 10 MG tablet TAKE 1 TABLET BY MOUTH DAILY. 90 tablet 1  . hydrochlorothiazide (HYDRODIURIL) 25 MG tablet TAKE 1 TABLET BY MOUTH DAILY. 90 tablet 1  . hyoscyamine (OSCIMIN) 0.125 MG TBDP disintergrating tablet Place 0.125 mg under the tongue every 4 (four) hours as needed.    Marland Kitchen ibuprofen (ADVIL,MOTRIN) 600 MG tablet Take 1 tablet (600 mg total) by mouth every 8 (eight) hours as needed. 30 tablet 0  . losartan (COZAAR) 100 MG tablet TAKE 1 TABLET (100 MG TOTAL) BY MOUTH AT BEDTIME. TAKE 1 TABLET BY MOUTH DAILY. 90 tablet 1  . Multiple Vitamin (MULTIVITAMIN WITH MINERALS) TABS tablet Take 1 tablet by mouth daily.    . sildenafil (VIAGRA) 100 MG tablet TAKE 1 TABLET BY MOUTH AS NEEDED FOR ERECTILE DYSFUNCTION. 5 tablet 11  . valACYclovir (VALTREX) 500 MG tablet TAKE 1 TABLET BY MOUTH DAILY. 90 tablet 1   No current facility-administered medications for this visit.    Allergies as of  02/21/2020 - Review Complete 02/21/2020  Allergen Reaction Noted  . Bee venom Swelling 07/09/2017    Family History  Problem Relation Age of Onset  . Hypertension Father   . Hypertension Mother   . Colon cancer Neg Hx   . Colon polyps Neg Hx     Social History   Socioeconomic History  . Marital status: Divorced    Spouse name: Not on file  . Number of children: Not on file  . Years of education: Not on file  . Highest education  level: Not on file  Occupational History  . Not on file  Tobacco Use  . Smoking status: Former Smoker    Types: Cigars  . Smokeless tobacco: Never Used  Vaping Use  . Vaping Use: Never used  Substance and Sexual Activity  . Alcohol use: Yes    Comment: ocassionaly  . Drug use: No  . Sexual activity: Not on file  Other Topics Concern  . Not on file  Social History Narrative   Divorced with no children. 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  Family in [employer]         Social Determinants of Health   Financial Resource Strain:   . Difficulty of Paying Living Expenses: Not on file  Food Insecurity:   . Worried About Charity fundraiser in the Last Year: Not on file  . Ran Out of Food in the Last Year: Not on file  Transportation Needs:   . Lack of Transportation (Medical): Not on file  . Lack of Transportation (Non-Medical): Not on file  Physical Activity:   . Days of Exercise per Week: Not on file  . Minutes of Exercise per Session: Not on file  Stress:   . Feeling of Stress : Not on file  Social Connections:   . Frequency of Communication with Friends and Family: Not on file  . Frequency of Social Gatherings with Friends and Family: Not on file  . Attends Religious Services: Not on file  . Active Member of Clubs or Organizations: Not on file  . Attends Archivist Meetings: Not on file  . Marital Status: Not on file  Intimate Partner Violence:   . Fear of Current or Ex-Partner: Not on file  . Emotionally Abused: Not on file  . Physically Abused: Not on file  . Sexually Abused: Not on file    Review of Systems: 12 system ROS is negative except as noted above.   Physical Exam: General:   Alert,  well-nourished, pleasant and cooperative in NAD Head:  Normocephalic and atraumatic. Eyes:  Sclera clear, no icterus.   Conjunctiva pink. Ears:  Normal auditory acuity. Nose:  No deformity, discharge,  or lesions. Mouth:  No deformity or lesions.   Neck:   Supple; no masses or thyromegaly. Lungs:  Clear throughout to auscultation.   No wheezes. Heart:  Regular rate and rhythm; no murmurs. Abdomen:  Soft, nontender, nondistended, normal bowel sounds, no rebound or guarding. No hepatosplenomegaly.   Rectal:  Deferred  Msk:  Symmetrical. No boney deformities LAD: No inguinal or umbilical LAD Extremities:  No clubbing or edema. Neurologic:  Alert and  oriented x4;  grossly nonfocal Skin:  Intact without significant lesions or rashes. Psych:  Alert and cooperative. Normal mood and affect.    Alyaan Budzynski L. Tarri Glenn, MD, MPH 02/21/2020, 11:26 AM

## 2020-02-22 LAB — IRON, TOTAL/TOTAL IRON BINDING CAP
%SAT: 23 % (calc) (ref 20–48)
Iron: 87 ug/dL (ref 50–180)
TIBC: 373 mcg/dL (calc) (ref 250–425)

## 2020-03-20 ENCOUNTER — Encounter: Payer: Self-pay | Admitting: Gastroenterology

## 2020-03-25 ENCOUNTER — Encounter (HOSPITAL_COMMUNITY): Payer: Self-pay | Admitting: *Deleted

## 2020-03-25 ENCOUNTER — Ambulatory Visit (HOSPITAL_COMMUNITY)
Admission: EM | Admit: 2020-03-25 | Discharge: 2020-03-25 | Disposition: A | Discharge status: Home / Self Care | Payer: 59

## 2020-03-25 ENCOUNTER — Other Ambulatory Visit: Payer: Self-pay

## 2020-03-25 DIAGNOSIS — R430 Anosmia: Secondary | ICD-10-CM | POA: Diagnosis not present

## 2020-03-25 DIAGNOSIS — R0981 Nasal congestion: Secondary | ICD-10-CM | POA: Diagnosis not present

## 2020-03-25 NOTE — Discharge Instructions (Signed)
Covid test pending, monitor my chart for results Continue to use over-the-counter congestion medicine Rest and fluids Follow-up if not improving or worsening

## 2020-03-25 NOTE — ED Provider Notes (Signed)
[facility]    CSN: [id number] Arrival date & time: 03/25/20  1025      History   Chief Complaint Chief Complaint  Patient presents with  . Nasal Congestion  . Loss of Taste/Smell    HPI CAYSON KALB is a 44 y.o. male history of hypertension presenting today for evaluation of nasal congestion loss of taste and smell.  Reports earlier in the week had some throat irritation, over the past few days has had more nasal congestion.  Also states a smell of the past 2 days.  Denies any fevers.  Denies cough chest pain or shortness of breath.  HPI  Past Medical History:  Diagnosis Date  . Hypertension   . Kidney stones   . 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 Pain in the abdomen 08/09/2019  . Prediabetes 05/18/2019  . Varicose vein of leg 10/19/2018  . Allergy 10/19/2018  . Gout 08/06/2016  . Erectile dysfunction 04/19/2012  . Hypertension 12/18/2010  . Recurrent genital herpes 12/18/2010  . Tobacco abuse 12/18/2010  . Obesity 12/18/2010    Past Surgical History:  Procedure Laterality Date  . BACK SURGERY    . ENDOVENOUS ABLATION SAPHENOUS VEIN W/ LASER Left 06/23/2019   endovenous laser ablation left greater saphenous vein and stab phlebectomy 10-20 incisions left leg by Gae Gallop MD        Home Medications    Prior to Admission medications   Medication Sig Start Date End Date Taking? Authorizing Provider  albuterol (VENTOLIN HFA) 108 (90 Base) MCG/ACT inhaler INHALE 2 PUFFS BY MOUTH INTO THE LUNGS EVERY 6 HOURS AS NEEDED FOR WHEEZING 12/08/19  Yes Chambliss, Jeb Levering, MD  amLODipine (NORVASC) 10 MG tablet TAKE 1 TABLET BY MOUTH DAILY. 07/11/19  Yes Chambliss, Jeb Levering, MD  hydrochlorothiazide (HYDRODIURIL) 25 MG tablet TAKE 1 TABLET BY MOUTH DAILY. 11/17/19  Yes Chambliss, Jeb Levering, MD  hyoscyamine (OSCIMIN) 0.125 MG TBDP disintergrating tablet Place  1 tablet (0.125 mg total) under the tongue every 4 (four) hours as needed. 02/21/20  Yes Thornton Park, MD  ibuprofen (ADVIL,MOTRIN) 600 MG tablet Take 1 tablet (600 mg total) by mouth every 8 (eight) hours as needed. 05/21/18  Yes Kinnie Feil, MD  losartan (COZAAR) 100 MG tablet TAKE 1 TABLET (100 MG TOTAL) BY MOUTH AT BEDTIME. TAKE 1 TABLET BY MOUTH DAILY. 07/11/19  Yes Lind Covert, MD  Multiple Vitamin (MULTIVITAMIN WITH MINERALS) TABS tablet Take 1 tablet by mouth daily.   Yes [provider]  valACYclovir (VALTREX) 500 MG tablet TAKE 1 TABLET BY MOUTH DAILY. 02/09/20  Yes Chambliss, Jeb Levering, MD  sildenafil (VIAGRA) 100 MG tablet TAKE 1 TABLET BY MOUTH AS NEEDED FOR ERECTILE DYSFUNCTION. 03/02/19   Lind Covert, MD    Family History Family History  Problem Relation Age of Onset  . Hypertension Father   . Hypertension Mother   . Colon cancer Neg Hx   . Colon polyps Neg Hx     Social History Social History   Tobacco Use  . Smoking status: Current Every Day Smoker    Types: Cigars  . Smokeless tobacco: Never Used  Vaping Use  . Vaping Use: Never used  Substance Use Topics  . Alcohol use: Yes    Comment: occasionally  . Drug use: No     Allergies   Bee venom   Review of Systems Review  of Systems  Constitutional: Positive for appetite change. Negative for activity change, chills, fatigue and fever.  HENT: Positive for congestion and rhinorrhea. Negative for ear pain, sinus pressure, sore throat and trouble swallowing.   Eyes: Negative for discharge and redness.  Respiratory: Negative for cough, chest tightness and shortness of breath.   Cardiovascular: Negative for chest pain.  Gastrointestinal: Negative for abdominal pain, diarrhea, nausea and vomiting.  Musculoskeletal: Negative for myalgias.  Skin: Negative for rash.  Neurological: Negative for dizziness, light-headedness and headaches.     Physical Exam Triage Vital  Signs ED Triage Vitals [03/25/20 1114]  Enc Vitals Group     BP (!) 147/93     Pulse Rate 88     Resp 18     Temp 98.1 F (36.7 C)     Temp Source Oral     SpO2 100 %     Weight      Height      Head Circumference      Peak Flow      Pain Score 0     Pain Loc      Pain Edu?      Excl. in [place]?    No data found.  Updated Vital Signs BP (!) 147/93 Comment: has not taken meds today  Pulse 88   Temp 98.1 F (36.7 C) (Oral)   Resp 18   SpO2 100%   Visual Acuity Right Eye Distance:   Left Eye Distance:   Bilateral Distance:    Right Eye Near:   Left Eye Near:    Bilateral Near:     Physical Exam Vitals and nursing note reviewed.  Constitutional:      Appearance: He is well-developed.     Comments: No acute distress  HENT:     Head: Normocephalic and atraumatic.     Ears:     Comments: Bilateral ears without tenderness to palpation of external auricle, tragus and mastoid, EAC's without erythema or swelling, TM's with good bony landmarks and cone of light. Non erythematous.     Nose: Nose normal.     Mouth/Throat:     Comments: Oral mucosa pink and moist, no tonsillar enlargement or exudate. Posterior pharynx patent and nonerythematous, no uvula deviation or swelling. Normal phonation. Eyes:     Conjunctiva/sclera: Conjunctivae normal.  Cardiovascular:     Rate and Rhythm: Normal rate.  Pulmonary:     Effort: Pulmonary effort is normal. No respiratory distress.     Comments: Breathing comfortably at rest, CTABL, no wheezing, rales or other adventitious sounds auscultated Abdominal:     General: There is no distension.  Musculoskeletal:        General: Normal range of motion.     Cervical back: Neck supple.  Skin:    General: Skin is warm and dry.  Neurological:     Mental Status: He is alert and oriented to person, place, and time.      UC Treatments / Results  Labs (all labs ordered are listed, but only abnormal results are displayed) Labs Reviewed   SARS CORONAVIRUS 2 (TAT 6-24 HRS)    EKG   Radiology No results found.  Procedures Procedures (including critical care time)  Medications Ordered in UC Medications - No data to display  Initial Impression / Assessment and Plan / UC Course  I have reviewed the triage vital signs and the nursing notes.  Pertinent labs & imaging results that were available during my care of the patient  were reviewed by me and considered in my medical decision making (see chart for details).     Viral URI-Covid test pending, recommending symptomatic and supportive care.  Discussed strict return precautions. Patient verbalized understanding and is agreeable with plan.  Final Clinical Impressions(s) / UC Diagnoses   Final diagnoses:  Nasal congestion  Anosmia     Discharge Instructions     Covid test pending, monitor my chart for results Continue to use over-the-counter congestion medicine Rest and fluids Follow-up if not improving or worsening    ED Prescriptions    None     PDMP not reviewed this encounter.   Gaylin Bulthuis, [place] C, PA-C 03/25/20 1150

## 2020-03-25 NOTE — ED Triage Notes (Signed)
C/O congestion and loss of taste/smell onset 3 days ago.  Reports fever 2 days ago that has resolved.

## 2020-03-26 ENCOUNTER — Telehealth: Payer: Self-pay

## 2020-03-26 NOTE — Telephone Encounter (Signed)
Pt scheduled to see Dr. Tarri Glenn on 03/28/20 at 4pm. Saw pt was in ER for nasal congestion, and loss of smell and taste on 11/28. Spoke with patient and he confirmed that he was swabbed for covid at the hospital and that he was waiting for his results. I asked pt if he was feeling okay, he stated that he still cannot smell or taste , and that the congestion is slowly going away. Spoke with MD and was advised that if patient is feeling symptomatic then we could reschedule patient to a later date. Advised patient that we could wait for his covid test results to make sure he is okay to proceed with his procedure.  Also spoke with patient about moving to the 0830 slot. Pt stated he would prefer a morning appointment. Let pt know I will call patient in the morning to go over new prep instructions with the hopes that we get his covid test results in the AM. Pt verbalized understanding.

## 2020-03-26 NOTE — Telephone Encounter (Signed)
Thank you :)

## 2020-03-27 NOTE — Telephone Encounter (Signed)
Phoned pt and we discussed his covid test that he had this weekend.  He had called the urgent care this morning and was told there no results.  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  I instructed him to call us with the results as soon as he receives them.  I moved his appointment to Thursday at 2:00pm to allow time for these results.  Pt will need instructions for this time change when he calls back.

## 2020-03-27 NOTE — Telephone Encounter (Signed)
Thank you for the followup.

## 2020-03-28 ENCOUNTER — Encounter: Payer: 59 | Admitting: Gastroenterology

## 2020-03-28 NOTE — Telephone Encounter (Signed)
Thanks for the update

## 2020-03-28 NOTE — Telephone Encounter (Signed)
Followed up with pt.  He got his test results back and he was positive for covid.  Advised him that he will need to be rescheduled.  He was rescheduled for 05/07/20 with a virtual PV on 04/25/20 @ 11:00 am.  Pt verbalized understanding.

## 2020-03-29 ENCOUNTER — Encounter: Payer: 59 | Admitting: Gastroenterology

## 2020-04-02 ENCOUNTER — Other Ambulatory Visit: Payer: Self-pay | Admitting: Family Medicine

## 2020-04-02 ENCOUNTER — Other Ambulatory Visit (HOSPITAL_COMMUNITY): Payer: Self-pay | Admitting: Family Medicine

## 2020-04-02 MED FILL — LOSARTAN POTASSIUM 100 MG T: 100 | 90 days supply | Qty: 90 | Fill #0

## 2020-04-02 MED FILL — OSCIMIN 0.125 MG TABLET: 0.125 | 5 days supply | Qty: 30 | Fill #0

## 2020-04-25 ENCOUNTER — Other Ambulatory Visit: Payer: Self-pay

## 2020-04-25 ENCOUNTER — Ambulatory Visit (AMBULATORY_SURGERY_CENTER): Payer: Self-pay | Admitting: *Deleted

## 2020-04-25 VITALS — Ht >= 80 in | Wt 356.0 lb

## 2020-04-25 DIAGNOSIS — R1032 Left lower quadrant pain: Secondary | ICD-10-CM

## 2020-04-25 NOTE — Progress Notes (Addendum)
No egg or soy allergy known to patient  No issues with past sedation with any surgeries or procedures No intubation problems in the past  No FH of Malignant Hyperthermia No diet pills per patient No home 02 use per patient  No blood thinners per patient  Pt denies issues with constipation  No A fib or A flutter  EMMI video to pt or via MyChart  COVID 19 guidelines implemented in PV today with Pt and RN  Pt is fully vaccinated  for Covid   Patient states he has Suprep at home after his last colonoscopy canceled.  Due to the COVID-19 pandemic we are asking patients to follow certain guidelines.  Pt aware of COVID protocols and [facility] guidelines

## 2020-05-01 ENCOUNTER — Encounter: Payer: Self-pay | Admitting: Gastroenterology

## 2020-05-07 ENCOUNTER — Other Ambulatory Visit: Payer: Self-pay

## 2020-05-07 ENCOUNTER — Encounter: Payer: Self-pay | Admitting: Gastroenterology

## 2020-05-07 ENCOUNTER — Ambulatory Visit (AMBULATORY_SURGERY_CENTER): Payer: 59 | Admitting: Gastroenterology

## 2020-05-07 VITALS — BP 123/74 | HR 62 | Temp 97.6°F | Resp 14 | Ht >= 80 in | Wt 356.0 lb

## 2020-05-07 DIAGNOSIS — K621 Rectal polyp: Secondary | ICD-10-CM

## 2020-05-07 DIAGNOSIS — R1032 Left lower quadrant pain: Secondary | ICD-10-CM | POA: Diagnosis not present

## 2020-05-07 DIAGNOSIS — D124 Benign neoplasm of descending colon: Secondary | ICD-10-CM

## 2020-05-07 DIAGNOSIS — I10 Essential (primary) hypertension: Secondary | ICD-10-CM | POA: Diagnosis not present

## 2020-05-07 DIAGNOSIS — K635 Polyp of colon: Secondary | ICD-10-CM | POA: Diagnosis not present

## 2020-05-07 DIAGNOSIS — D128 Benign neoplasm of rectum: Secondary | ICD-10-CM

## 2020-05-07 DIAGNOSIS — J45909 Unspecified asthma, uncomplicated: Secondary | ICD-10-CM | POA: Diagnosis not present

## 2020-05-07 MED ORDER — SODIUM CHLORIDE 0.9 % IV SOLN: 500.0000 mL | Freq: Once | INTRAVENOUS | Status: DC

## 2020-05-07 NOTE — Progress Notes (Signed)
Report given to PACU, vss 

## 2020-05-07 NOTE — Op Note (Signed)
[place] Patient Name: William Franco Procedure Date: 05/07/2020 9:07 AM MRN: [id number] Endoscopist: Thornton Park MD, MD Age: 45 Referring MD:  Date of Birth: 03-03-1976 Gender: Male Account #: [id number] Procedure:                Colonoscopy Indications:              Screening for colorectal malignant neoplasm, This                            is the patient's first colonoscopy                           Recently had left lower quadrant pain that had                            resolved prior to this procedure                           No known family history of colon cancer or polyps Medicines:                Monitored Anesthesia Care Procedure:                Pre-Anesthesia Assessment:                           - Prior to the procedure, a History and Physical                            was performed, and patient medications and                            allergies were reviewed. The patient's tolerance of                            previous anesthesia was also reviewed. The risks                            and benefits of the procedure and the sedation                            options and risks were discussed with the patient.                            All questions were answered, and informed consent                            was obtained. Prior Anticoagulants: The patient has                            taken no previous anticoagulant or antiplatelet                            agents. ASA Grade Assessment: II - A patient with  mild systemic disease. After reviewing the risks                            and benefits, the patient was deemed in                            satisfactory condition to undergo the procedure.                           After obtaining informed consent, the colonoscope                            was passed under direct vision. Throughout the                            procedure, the patient's blood pressure,  pulse, and                            oxygen saturations were monitored continuously. The                            Olympus [id number] was introduced                            through the anus and advanced to the 3 cm into the                            ileum. A second forward view of the right colon was                            performed. The colonoscopy was performed without                            difficulty. The patient tolerated the procedure                            well. The quality of the bowel preparation was                            good. The terminal ileum, ileocecal valve,                            appendiceal orifice, and rectum were photographed. Scope In: 9:22:15 AM Scope Out: 9:36:40 AM Scope Withdrawal Time: 0 hours 12 minutes 36 seconds  Total Procedure Duration: 0 hours 14 minutes 25 seconds  Findings:                 Non-bleeding external and internal hemorrhoids were                            found.                           Three sessile polyps were found in the rectum and  descending colon. The polyps were 1 to 2 mm in                            size. These polyps were removed with a piecemeal                            technique using a cold biopsy forceps. Resection                            and retrieval were complete. Estimated blood loss                            was minimal.                           The exam was otherwise without abnormality on                            direct and retroflexion views. Complications:            No immediate complications. Estimated blood loss:                            Minimal. Estimated Blood Loss:     Estimated blood loss was minimal. Impression:               - Non-bleeding external and internal hemorrhoids.                           - Three 1 to 2 mm polyps in the rectum and in the                            descending colon, removed piecemeal using a cold                             biopsy forceps. Resected and retrieved.                           - The examination was otherwise normal on direct                            and retroflexion views. Recommendation:           - Patient has a contact number available for                            emergencies. The signs and symptoms of potential                            delayed complications were discussed with the                            patient. Return to normal activities tomorrow.  Written discharge instructions were provided to the                            patient.                           - Resume previous diet.                           - Continue present medications.                           - Await pathology results.                           - Repeat colonoscopy date to be determined after                            pending pathology results are reviewed for                            surveillance.                           - Emerging evidence supports eating a diet of                            fruits, vegetables, grains, calcium, and yogurt                            while reducing red meat and alcohol may reduce the                            risk of colon cancer.                           - Thank you for allowing me to be involved in your                            colon cancer prevention. Thornton Park MD, MD 05/07/2020 9:43:47 AM This report has been signed electronically.

## 2020-05-07 NOTE — Progress Notes (Signed)
Pt's states no medical or surgical changes since previsit or office visit.   Vs by CW in adm 

## 2020-05-07 NOTE — Progress Notes (Signed)
Called to room to assist during endoscopic procedure.  Patient ID and intended procedure confirmed with present staff. Received instructions for my participation in the procedure from the performing physician.  

## 2020-05-07 NOTE — Patient Instructions (Signed)
YOU HAD AN ENDOSCOPIC PROCEDURE TODAY AT THE [facility]:   Refer to the procedure report that was given to you for any specific questions about what was found during the examination.  If the procedure report does not answer your questions, please call your gastroenterologist to clarify.  If you requested that your care partner not be given the details of your procedure findings, then the procedure report has been included in a sealed envelope for you to review at your convenience later.  YOU SHOULD EXPECT: Some feelings of bloating in the abdomen. Passage of more gas than usual.  Walking can help get rid of the air that was put into your GI tract during the procedure and reduce the bloating. If you had a lower endoscopy (such as a colonoscopy or flexible sigmoidoscopy) you may notice spotting of blood in your stool or on the toilet paper. If you underwent a bowel prep for your procedure, you may not have a normal bowel movement for a few days.  Please Note:  You might notice some irritation and congestion in your nose or some drainage.  This is from the oxygen used during your procedure.  There is no need for concern and it should clear up in a day or so.  SYMPTOMS TO REPORT IMMEDIATELY:   Following lower endoscopy (colonoscopy or flexible sigmoidoscopy):  Excessive amounts of blood in the stool  Significant tenderness or worsening of abdominal pains  Swelling of the abdomen that is new, acute  Fever of 100F or higher   Following upper endoscopy (EGD)  Vomiting of blood or coffee ground material  New chest pain or pain under the shoulder blades  Painful or persistently difficult swallowing  New shortness of breath  Fever of 100F or higher  Black, tarry-looking stools  For urgent or emergent issues, a gastroenterologist can be reached at any hour by calling [phone]. Do not use MyChart messaging for urgent concerns.    DIET:  We do recommend a small meal at first, but  then you may proceed to your regular diet.  Drink plenty of fluids but you should avoid alcoholic beverages for 24 hours.  ACTIVITY:  You should plan to take it easy for the rest of today and you should NOT DRIVE or use heavy machinery until tomorrow (because of the sedation medicines used during the test).    FOLLOW UP: Our staff will call the number listed on your records 48-72 hours following your procedure to check on you and address any questions or concerns that you may have regarding the information given to you following your procedure. If we do not reach you, we will leave a message.  We will attempt to reach you two times.  During this call, we will ask if you have developed any symptoms of COVID 19. If you develop any symptoms (ie: fever, flu-like symptoms, shortness of breath, cough etc.) before then, please call [phone].  If you test positive for Covid 19 in the 2 weeks post procedure, please call and report this information to us.    If any biopsies were taken you will be contacted by phone or by letter within the next 1-3 weeks.  Please call us at [phone] if you have not heard about the biopsies in 3 weeks.    SIGNATURES/CONFIDENTIALITY: You and/or your care partner have signed paperwork which will be entered into your electronic medical record.  These signatures attest to the fact that that the information above on   your After Visit Summary has been reviewed and is understood.  Full responsibility of the confidentiality of this discharge information lies with you and/or your care-partner. 

## 2020-05-09 ENCOUNTER — Telehealth: Payer: Self-pay

## 2020-05-09 NOTE — Telephone Encounter (Signed)
  Follow up Call-  Call back number 05/07/2020  Post procedure Call Back phone  # [phone]  Permission to leave phone message Yes  Some recent data might be hidden     Patient questions:  Do you have a fever, pain , or abdominal swelling? No. Pain Score  0 *  Have you tolerated food without any problems? Yes.    Have you been able to return to your normal activities? Yes.    Do you have any questions about your discharge instructions: Diet   No. Medications  No. Follow up visit  No.  Do you have questions or concerns about your Care? No.  Actions: * If pain score is 4 or above: No action needed, pain <4.  1. Have you developed a fever since your procedure? No   2.   Have you had an respiratory symptoms (SOB or cough) since your procedure? no  3.   Have you tested positive for COVID 19 since your procedure no  4.   Have you had any family members/close contacts diagnosed with the COVID 19 since your procedure?  no   If yes to any of these questions please route to Joylene John, RN and Joella Prince, RN

## 2020-05-15 ENCOUNTER — Encounter: Payer: Self-pay | Admitting: Gastroenterology

## 2020-06-06 ENCOUNTER — Other Ambulatory Visit: Payer: Self-pay | Admitting: Family Medicine

## 2020-06-06 ENCOUNTER — Other Ambulatory Visit (HOSPITAL_COMMUNITY): Payer: Self-pay | Admitting: Family Medicine

## 2020-06-06 DIAGNOSIS — I10 Essential (primary) hypertension: Secondary | ICD-10-CM

## 2020-06-06 MED FILL — HYDROCHLOROTHIAZIDE 25 MG T: 25 | 90 days supply | Qty: 90 | Fill #1

## 2020-06-06 MED FILL — AMLODIPINE BESYLATE 10 MG T: 10 | 90 days supply | Qty: 90 | Fill #0

## 2020-07-10 ENCOUNTER — Ambulatory Visit (INDEPENDENT_AMBULATORY_CARE_PROVIDER_SITE_OTHER): Payer: 59 | Admitting: Family Medicine

## 2020-07-10 ENCOUNTER — Other Ambulatory Visit: Payer: Self-pay

## 2020-07-10 ENCOUNTER — Encounter: Payer: Self-pay | Admitting: Family Medicine

## 2020-07-10 VITALS — BP 140/82 | HR 75 | Wt 362.4 lb

## 2020-07-10 DIAGNOSIS — Z1159 Encounter for screening for other viral diseases: Secondary | ICD-10-CM

## 2020-07-10 DIAGNOSIS — R109 Unspecified abdominal pain: Secondary | ICD-10-CM | POA: Diagnosis not present

## 2020-07-10 DIAGNOSIS — Z13220 Encounter for screening for lipoid disorders: Secondary | ICD-10-CM | POA: Diagnosis not present

## 2020-07-10 DIAGNOSIS — Z720 Tobacco use: Secondary | ICD-10-CM

## 2020-07-10 DIAGNOSIS — I10 Essential (primary) hypertension: Secondary | ICD-10-CM

## 2020-07-10 NOTE — Progress Notes (Signed)
    SUBJECTIVE:   CHIEF COMPLAINT / HPI:   Abdominal Pain - overall improved.  Intermittently has twinges of discomfort in RUQ and lower mid abdomen.  No interference with activities or appetite.  No weight loss or nausea and vomiting.  Colonoscopy was normal except for polyps.  He takes levsin as needed which helps  Smoking - 1 cigar a day or so  Weight - his grandmother died recently and that interfered with diet and exercise.  Plans to get back to this  Hypertension - does not check at work.  Takes his medications regularly  PERTINENT  PMH / [place]: Planning to get married next year  OBJECTIVE:   BP 140/82   Pulse 75   Wt (!) 362 lb 6.4 oz (164.4 kg)   SpO2 98%   BMI 39.81 kg/m   Heart - Regular rate and rhythm.  No murmurs, gallops or rubs.    Lungs:  Normal respiratory effort, chest expands symmetrically. Lungs are clear to auscultation, no crackles or wheezes. Extremities:  No cyanosis, edema, or deformity noted with good range of motion of all major joints.     ASSESSMENT/PLAN:   Hypertension BP Readings from Last 3 Encounters:  07/10/20 140/82  05/07/20 123/74  03/25/20 (!) 147/93   Not completely at goal.  Monitor at home and work.  Consider adding spironolactone if does not decrease with weight control   Tobacco abuse Encouraged to stop completely   Pain in the abdomen Most consistent with IBS.  Normal abdomen CT and colonoscopy.  Recommend regular exercise and diet and as needed levsin.  Let us know if symptoms changing      Lind Covert, MD [place]

## 2020-07-10 NOTE — Patient Instructions (Signed)
Good to see you today - Thank you for coming in  Things we discussed today:  For the abdomen pain - use the levsin as needed.  Let me know if worsening or any bleeding or changing  Your homework You need to Improve your Weight Drink water and other unsweetened drinks like coffee, milk, tea- No sweet drinks Eat smaller portions of starchy and sweet foods like rice, potatoes, wheat, corn, or fruits.  Eat twice as many vegetables  Exercise at least 30 minutes every day If you wish to discuss weight loss further, please let me know or make an appointment You need to stop smoking!  Call [facility] (1-800-QUIT-NOW) for ideas  Choose a quit date when you will stop completely.  Get prepared by slowly cutting down.  Find a substitute to use when you think you need a cigarette.  If you wish to discuss nicotine replacement (patches, gum) please make an appointment  Consider coaching through [facility]  Consider checking our blood pressure at work.  Goal is < 135/80  I will call you if your tests are not good.  Otherwise, I will send you a message on MyChart (if it is active) or a letter in the mail..  If you do not hear from me with in 2 weeks please call our office.   ;  Please always bring your medication bottles  Come back to see me in six months

## 2020-07-11 LAB — LIPID PANEL
Chol/HDL Ratio: 5 ratio (ref 0.0–5.0)
Cholesterol, Total: 199 mg/dL (ref 100–199)
HDL: 40 mg/dL (ref >39)
LDL Chol Calc (NIH): 132 mg/dL — ABNORMAL HIGH (ref 0–99)
Triglycerides: 151 mg/dL — ABNORMAL HIGH (ref 0–149)
VLDL Cholesterol Cal: 27 mg/dL (ref 5–40)

## 2020-07-11 LAB — HEPATITIS C ANTIBODY: Hep C Virus Ab: 0.2 s/co ratio (ref 0.0–0.9)

## 2020-07-11 NOTE — Assessment & Plan Note (Signed)
Most consistent with IBS.  Normal abdomen CT and colonoscopy.  Recommend regular exercise and diet and as needed levsin.  Let us know if symptoms changing

## 2020-07-11 NOTE — Assessment & Plan Note (Signed)
Encouraged to stop completely

## 2020-07-11 NOTE — Assessment & Plan Note (Signed)
BP Readings from Last 3 Encounters:  07/10/20 140/82  05/07/20 123/74  03/25/20 (!) 147/93   Not completely at goal.  Monitor at home and work.  Consider adding spironolactone if does not decrease with weight control

## 2020-08-06 ENCOUNTER — Telehealth: Payer: Self-pay

## 2020-08-06 NOTE — Telephone Encounter (Signed)
Patient called to report swelling and heaviness in his LLE "like he had before." He is s/p ablation in early 2021. Placed him on [facility] schedule for follow up vein appt with LLE reflux study.

## 2020-08-29 ENCOUNTER — Other Ambulatory Visit: Payer: Self-pay

## 2020-08-29 DIAGNOSIS — I872 Venous insufficiency (chronic) (peripheral): Secondary | ICD-10-CM

## 2020-09-06 ENCOUNTER — Ambulatory Visit: Payer: 59

## 2020-09-07 ENCOUNTER — Ambulatory Visit (HOSPITAL_COMMUNITY)
Admission: EM | Admit: 2020-09-07 | Discharge: 2020-09-07 | Disposition: A | Discharge status: Home / Self Care | Payer: 59 | Attending: Student | Admitting: Student

## 2020-09-07 ENCOUNTER — Encounter (HOSPITAL_COMMUNITY): Payer: Self-pay

## 2020-09-07 ENCOUNTER — Other Ambulatory Visit (HOSPITAL_COMMUNITY): Payer: Self-pay

## 2020-09-07 DIAGNOSIS — I10 Essential (primary) hypertension: Secondary | ICD-10-CM | POA: Diagnosis not present

## 2020-09-07 DIAGNOSIS — Z8616 Personal history of COVID-19: Secondary | ICD-10-CM

## 2020-09-07 DIAGNOSIS — Z760 Encounter for issue of repeat prescription: Secondary | ICD-10-CM

## 2020-09-07 DIAGNOSIS — R7303 Prediabetes: Secondary | ICD-10-CM

## 2020-09-07 DIAGNOSIS — J301 Allergic rhinitis due to pollen: Secondary | ICD-10-CM | POA: Diagnosis not present

## 2020-09-07 MED ORDER — LOSARTAN POTASSIUM 100 MG PO TABS
100.0000 mg | ORAL_TABLET | Freq: Every day | ORAL | 2 refills | Status: DC
  Filled 2020-09-07: qty 90, 90d supply, fill #0

## 2020-09-07 MED ORDER — MONTELUKAST SODIUM 10 MG PO TABS
10.0000 mg | ORAL_TABLET | Freq: Every day | ORAL | 2 refills | Status: DC
  Filled 2020-09-07: qty 30, 30d supply, fill #0
  Filled 2021-03-25: qty 30, 30d supply, fill #1

## 2020-09-07 MED ORDER — FLUTICASONE PROPIONATE 50 MCG/ACT NA SUSP
1.0000 | Freq: Every day | NASAL | 2 refills | Status: DC
  Filled 2020-09-07: qty 16, 60d supply, fill #0

## 2020-09-07 MED ORDER — PREDNISONE 20 MG PO TABS
40.0000 mg | ORAL_TABLET | Freq: Every day | ORAL | 0 refills | Status: AC
  Filled 2020-09-07: qty 10, 5d supply, fill #0

## 2020-09-07 NOTE — ED Triage Notes (Signed)
Pt c/o feeling congestion in sinuses and headaches, and feeling "woozy" for about a week.

## 2020-09-07 NOTE — Discharge Instructions (Signed)
-  Start the Singulair for your allergic rhinitis, 1 pill daily at bedtime.  Try this for at least 1 week daily, continue for longer if it is helping. -Prednisone, 2 pills taken at the same time for 5 days in a row.  Try taking this earlier in the day as it can give you energy.  -Restart Losartan 1 pill daily. Please check your blood pressure at home or at the pharmacy. If this continues to be >140/90, follow-up with your primary care provider for further blood pressure management/ medication titration. If you develop chest pain, shortness of breath, vision changes, the worst headache of your life- head straight to the ED or call 911. -You currently do not have a bacterial sinus infection, but here are some symptoms to look out for:  If your symptoms get worse instead of better despite treatment, like worsening of facial pressure, new bloody mucus coming from your nose, new fever/chills-you may now have a sinus infection.  Come back and see [place], we may consider antibiotic management at that time.

## 2020-09-07 NOTE — ED Provider Notes (Signed)
[place]    CSN: [id number] Arrival date & time: 09/07/20  0836      History   Chief Complaint Chief Complaint  Patient presents with  . Nasal Congestion  . Headache    HPI William Franco is a 45 y.o. male presenting with 1 week of nasal congestion, with 3 days of pressure behind nose, headaches.  Medical history of hypertension that is typically well controlled on losartan but he has been out of this for 3 weeks.  Has been taking his HCTZ and amlodipine as directed.  Also history of kidney stones and allergies.  States he initially did think that his symptoms were related to allergies, so he took Zyrtec for 2 days in a row with some improvement in symptoms. States symptoms returned after stopping the Zyrtec, and now he is here today with 3 days of progressively worsening facial pressure with scant nasal congestion.  Sudafed has been providing some relief.  Denies other URI symptoms including cough, fever/chills, nausea/vomiting/diarrhea.  Denies chest pain, dizziness, shortness of breath, headaches, vision changes, weakness.  Requesting refill on the losartan.  HPI  Past Medical History:  Diagnosis Date  . Asthma   . Hypertension   . Kidney stones   . 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 Pain in the abdomen 08/09/2019  . Prediabetes 05/18/2019  . Varicose vein of leg 10/19/2018  . Allergy 10/19/2018  . Gout 08/06/2016  . Erectile dysfunction 04/19/2012  . Hypertension 12/18/2010  . Recurrent genital herpes 12/18/2010  . Tobacco abuse 12/18/2010  . Obesity 12/18/2010    Past Surgical History:  Procedure Laterality Date  . BACK SURGERY    . ENDOVENOUS ABLATION SAPHENOUS VEIN W/ LASER Left 06/23/2019   endovenous laser ablation left greater saphenous vein and stab phlebectomy 10-20 incisions left leg by Gae Gallop MD        Home Medications    Prior to  Admission medications   Medication Sig Start Date End Date Taking? Authorizing Provider  albuterol (VENTOLIN HFA) 108 (90 Base) MCG/ACT inhaler INHALE 2 PUFFS BY MOUTH INTO THE LUNGS EVERY 6 HOURS AS NEEDED FOR WHEEZING 12/08/19  Yes Chambliss, Jeb Levering, MD  amLODipine (NORVASC) 10 MG tablet TAKE 1 TABLET BY MOUTH DAILY. 06/06/20  Yes Chambliss, Jeb Levering, MD  fluticasone (FLONASE) 50 MCG/ACT nasal spray Place 1 spray into both nostrils daily. 09/07/20  Yes Hazel Sams, PA-C  hydrochlorothiazide (HYDRODIURIL) 25 MG tablet TAKE 1 TABLET BY MOUTH DAILY. 11/17/19  Yes Chambliss, Jeb Levering, MD  hyoscyamine (ANASPAZ) 0.125 MG TBDP disintergrating tablet PLACE 1 TABLET UNDER THE TONGUE EVERY 4 HOURS AS NEEDED. 02/21/20 02/20/21 Yes Beavers, Joelene Millin, MD  hyoscyamine (LEVSIN) 0.125 MG tablet TAKE 1 TABLET (0.125 MG TOTAL) BY MOUTH EVERY 4 (FOUR) HOURS AS NEEDED. 04/02/20 04/02/21 Yes Chambliss, Jeb Levering, MD  hyoscyamine (OSCIMIN) 0.125 MG TBDP disintergrating tablet Place 1 tablet (0.125 mg total) under the tongue every 4 (four) hours as needed. 02/21/20  Yes Thornton Park, MD  ibuprofen (ADVIL,MOTRIN) 600 MG tablet Take 1 tablet (600 mg total) by mouth every 8 (eight) hours as needed. 05/21/18  Yes Kinnie Feil, MD  losartan (COZAAR) 100 MG tablet Take 1 tablet (100 mg total) by mouth daily. 09/07/20  Yes Hazel Sams, PA-C  montelukast (SINGULAIR) 10 MG tablet Take 1 tablet (10 mg total) by mouth at bedtime. 09/07/20  Yes Phillip Heal,  Sherlon Handing, PA-C  Multiple Vitamin (MULTIVITAMIN WITH MINERALS) TABS tablet Take 1 tablet by mouth daily.   Yes [provider]  Na Sulfate-K Sulfate-Mg Sulf 17.5-3.13-1.6 GM/177ML SOLN USE AS DIRECTED. 02/21/20 02/20/21 Yes Beavers, Joelene Millin, MD  OSCIMIN 0.125 MG tablet TAKE 1 TABLET (0.125 MG TOTAL) BY MOUTH EVERY 4 (FOUR) HOURS AS NEEDED. 04/02/20  Yes Chambliss, Jeb Levering, MD  predniSONE (DELTASONE) 20 MG tablet Take 2 tablets (40 mg total) by mouth daily for  5 days. 09/07/20 09/12/20 Yes Hazel Sams, PA-C  sildenafil (VIAGRA) 100 MG tablet TAKE 1 TABLET BY MOUTH AS NEEDED FOR ERECTILE DYSFUNCTION. 03/02/19  Yes Chambliss, Jeb Levering, MD  valACYclovir (VALTREX) 500 MG tablet TAKE 1 TABLET BY MOUTH DAILY. 02/09/20  Yes Chambliss, Jeb Levering, MD  albuterol (VENTOLIN HFA) 108 (90 Base) MCG/ACT inhaler INHALE 2 PUFFS BY MOUTH INTO THE LUNGS EVERY 6 HOURS AS NEEDED FOR WHEEZING 12/08/19 12/07/20  Lind Covert, MD  amLODipine (NORVASC) 10 MG tablet TAKE 1 TABLET BY MOUTH DAILY. 06/06/20 06/06/21  Lind Covert, MD  hydrochlorothiazide (HYDRODIURIL) 25 MG tablet TAKE 1 TABLET BY MOUTH DAILY. 11/17/19 11/16/20  Lind Covert, MD  valACYclovir (VALTREX) 500 MG tablet TAKE 1 TABLET BY MOUTH DAILY. 02/09/20 02/08/21  Lind Covert, MD    Family History Family History  Problem Relation Age of Onset  . Hypertension Father   . Hypertension Mother   . Colon cancer Neg Hx   . Colon polyps Neg Hx   . Esophageal cancer Neg Hx   . Rectal cancer Neg Hx   . Stomach cancer Neg Hx     Social History Social History   Tobacco Use  . Smoking status: Current Every Day Smoker    Types: Cigars  . Smokeless tobacco: Never Used  Vaping Use  . Vaping Use: Never used  Substance Use Topics  . Alcohol use: Yes    Comment: occasionally  . Drug use: No     Allergies   Bee venom   Review of Systems Review of Systems  Constitutional: Negative for appetite change, chills and fever.  HENT: Positive for congestion and sinus pressure. Negative for ear pain, rhinorrhea, sinus pain and sore throat.   Eyes: Negative for redness and visual disturbance.  Respiratory: Negative for cough, chest tightness, shortness of breath and wheezing.   Cardiovascular: Negative for chest pain and palpitations.  Gastrointestinal: Negative for abdominal pain, constipation, diarrhea, nausea and vomiting.  Genitourinary: Negative for dysuria, frequency and  urgency.  Musculoskeletal: Negative for myalgias.  Neurological: Positive for headaches. Negative for dizziness and weakness.  Psychiatric/Behavioral: Negative for confusion.  All other systems reviewed and are negative.    Physical Exam Triage Vital Signs ED Triage Vitals  Enc Vitals Group     BP 09/07/20 0907 (!) 160/103     Pulse Rate 09/07/20 0907 78     Resp 09/07/20 0907 18     Temp 09/07/20 0907 98.8 F (37.1 C)     Temp src --      SpO2 09/07/20 0907 97 %     Weight --      Height --      Head Circumference --      Peak Flow --      Pain Score 09/07/20 0903 4     Pain Loc --      Pain Edu? --      Excl. in [place]? --    No data found.  Updated  Vital Signs BP (!) 160/103   Pulse 78   Temp 98.8 F (37.1 C)   Resp 18   SpO2 97%   Visual Acuity Right Eye Distance:   Left Eye Distance:   Bilateral Distance:    Right Eye Near:   Left Eye Near:    Bilateral Near:     Physical Exam Vitals reviewed.  Constitutional:      General: He is not in acute distress.    Appearance: Normal appearance. He is not ill-appearing.  HENT:     Head: Normocephalic and atraumatic.     Right Ear: Hearing, tympanic membrane, ear canal and external ear normal. No swelling or tenderness. There is no impacted cerumen. No mastoid tenderness. Tympanic membrane is not perforated, erythematous, retracted or bulging.     Left Ear: Hearing, tympanic membrane, ear canal and external ear normal. No swelling or tenderness. There is no impacted cerumen. No mastoid tenderness. Tympanic membrane is not perforated, erythematous, retracted or bulging.     Nose:     Right Sinus: Maxillary sinus tenderness present. No frontal sinus tenderness.     Left Sinus: Maxillary sinus tenderness present. No frontal sinus tenderness.     Mouth/Throat:     Mouth: Mucous membranes are moist.     Pharynx: Uvula midline. No oropharyngeal exudate or posterior oropharyngeal erythema.     Tonsils: No tonsillar  exudate.  Cardiovascular:     Rate and Rhythm: Normal rate and regular rhythm.     Heart sounds: Normal heart sounds.  Pulmonary:     Breath sounds: Normal breath sounds and air entry. No wheezing, rhonchi or rales.  Chest:     Chest wall: No tenderness.  Abdominal:     General: Abdomen is flat. Bowel sounds are normal.     Tenderness: There is no abdominal tenderness. There is no guarding or rebound.  Lymphadenopathy:     Cervical: No cervical adenopathy.  Neurological:     General: No focal deficit present.     Mental Status: He is alert and oriented to person, place, and time.  Psychiatric:        Attention and Perception: Attention and perception normal.        Mood and Affect: Mood and affect normal.        Behavior: Behavior normal. Behavior is cooperative.        Thought Content: Thought content normal.        Judgment: Judgment normal.      UC Treatments / Results  Labs (all labs ordered are listed, but only abnormal results are displayed) Labs Reviewed - No data to display  EKG   Radiology No results found.  Procedures Procedures (including critical care time)  Medications Ordered in UC Medications - No data to display  Initial Impression / Assessment and Plan / UC Course  I have reviewed the triage vital signs and the nursing notes.  Pertinent labs & imaging results that were available during my care of the patient were reviewed by me and considered in my medical decision making (see chart for details).     This patient is a 45 year old male presenting with poorly controlled allergic rhinitis, he is taking no medications for this.  He also requires a refill on his blood pressure medication- losartan.   Blood pressure is elevated at 160/103 today, patient has been out of his losartan for 3 weeks.  Refilled this today.  Continue amlodipine and HCTZ.  Also recommended he stop Sudafed.  Monitor blood pressure at home.  Prednisone as below, he has  prediabetes, last A1c was 5.7.  Also sent Singulair and Flonase.  Coding this visit a level 4 as patient presented with 3 chronic illnesses, one with acute exacerbation (allergies, hypertension/medication refill, prediabetes), and prescription drug management.  ED return precautions discussed.  Final Clinical Impressions(s) / UC Diagnoses   Final diagnoses:  Essential hypertension  Medication refill  Seasonal allergic rhinitis due to pollen  History of COVID-19   Discharge Instructions   None    ED Prescriptions    Medication Sig Dispense Auth. Provider   losartan (COZAAR) 100 MG tablet Take 1 tablet (100 mg total) by mouth daily. 30 tablet Hazel Sams, PA-C   predniSONE (DELTASONE) 20 MG tablet Take 2 tablets (40 mg total) by mouth daily for 5 days. 10 tablet Hazel Sams, PA-C   montelukast (SINGULAIR) 10 MG tablet Take 1 tablet (10 mg total) by mouth at bedtime. 30 tablet Hazel Sams, PA-C   fluticasone [facility]) 50 MCG/ACT nasal spray Place 1 spray into both nostrils daily. 15 mL Hazel Sams, PA-C     PDMP not reviewed this encounter.   Hazel Sams, PA-C 09/07/20 [phone]

## 2020-09-13 ENCOUNTER — Other Ambulatory Visit: Payer: Self-pay

## 2020-09-13 ENCOUNTER — Encounter: Payer: Self-pay | Admitting: Vascular Surgery

## 2020-09-13 ENCOUNTER — Ambulatory Visit (HOSPITAL_COMMUNITY)
Admission: RE | Admit: 2020-09-13 | Discharge: 2020-09-13 | Disposition: A | Discharge status: Home / Self Care | Payer: 59 | Source: Ambulatory Visit | Attending: Vascular Surgery | Admitting: Vascular Surgery

## 2020-09-13 ENCOUNTER — Ambulatory Visit: Payer: 59 | Admitting: Vascular Surgery

## 2020-09-13 VITALS — BP 138/83 | HR 78 | Temp 97.3°F | Resp 16 | Ht >= 80 in | Wt 361.0 lb

## 2020-09-13 DIAGNOSIS — I872 Venous insufficiency (chronic) (peripheral): Secondary | ICD-10-CM

## 2020-09-13 NOTE — Progress Notes (Signed)
REASON FOR VISIT:   Follow-up of chronic venous insufficiency  MEDICAL ISSUES:   CHRONIC VENOUS INSUFFICIENCY: This patient returns presents with some recurrent symptoms in the left leg and he was concerned about recurrent venous disease.  His venous duplex scan today shows no evidence of DVT.  The left great saphenous vein remains closed.  He does have some deep venous reflux in the common femoral vein and popliteal vein.  The fact that his symptoms are worse initially and improved during the day suggest that perhaps the hip arthritis or back issues may be contributing to his symptoms.  Regardless, with respect to his venous disease I have encouraged him to continue to wear his compression stockings.  We discussed potentially using a thigh-high stocking but he finds these uncomfortable.  We also discussed perhaps trying a higher gradient compression although he thinks he might have the 20-30 stockings.  We also discussed importance of exercise specifically walking and water aerobics.  I encouraged him to try to avoid prolonged sitting and standing.  In addition I encouraged him to elevate his legs at the end of the day and we discussed the proper positioning for this.  If his symptoms progress I will be happy to see him back at any time.  If the hip pain and back pain progress I think he would need to be evaluated for hip arthritis or degenerative disc disease of the back which certainly may be contributing to his symptoms.   HPI:   William Franco is a pleasant 45 y.o. male right seen in January 2021 with painful varicose veins.  He had failed conservative treatment and was found to have significant reflux in the left great saphenous vein.  He underwent laser ablation of the left great saphenous vein with 10-20 stab phlebectomies on 06/23/2019.  I saw him in follow-up on 06/30/2019.  This showed that he had successful laser ablation of the left great saphenous vein from the saphenofemoral junction  to the knee.  He was to be seen in follow-up as needed.  Today he tells me that after the ablation procedure on February initially his legs felt significantly better.  However about 3 months ago he began developing recurrent aching pain and heaviness in his left leg similar to the symptoms he had before.  Of note however he has had some hip pain and also some pain down the posterior aspect of his thigh in addition to some back pain.  Thus there may be multiple issues involved.  He denies any history of claudication or rest pain.  He does wear knee-high compression stocking but is not sure of the pressure gradient.  He does try to elevate his legs at the end of the day.  Past Medical History:  Diagnosis Date  . Asthma   . Hypertension   . Kidney stones   . 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 Hypertension Father   . Hypertension Mother   . Colon cancer Neg Hx   . Colon polyps Neg Hx   . Esophageal cancer Neg Hx   . Rectal cancer Neg Hx   . Stomach cancer Neg Hx     SOCIAL HISTORY: Social History   Tobacco Use  . Smoking status: Current Every Day Smoker    Types: Cigars  . Smokeless tobacco: Never Used  Substance Use Topics  . Alcohol use: Yes    Comment:  occasionally    Allergies  Allergen Reactions  . Bee Venom Swelling    Current Outpatient Medications  Medication Sig Dispense Refill  . albuterol (VENTOLIN HFA) 108 (90 Base) MCG/ACT inhaler INHALE 2 PUFFS BY MOUTH INTO THE LUNGS EVERY 6 HOURS AS NEEDED FOR WHEEZING 18 g 2  . albuterol (VENTOLIN HFA) 108 (90 Base) MCG/ACT inhaler INHALE 2 PUFFS BY MOUTH INTO THE LUNGS EVERY 6 HOURS AS NEEDED FOR WHEEZING 18 g 2  . amLODipine (NORVASC) 10 MG tablet TAKE 1 TABLET BY MOUTH DAILY. 90 tablet 1  . amLODipine (NORVASC) 10 MG tablet TAKE 1 TABLET BY MOUTH DAILY. 90 tablet 1  . fluticasone (FLONASE) 50 MCG/ACT nasal spray Place 1 spray into both  nostrils daily. 16 g 2  . hydrochlorothiazide (HYDRODIURIL) 25 MG tablet TAKE 1 TABLET BY MOUTH DAILY. 90 tablet 1  . hydrochlorothiazide (HYDRODIURIL) 25 MG tablet TAKE 1 TABLET BY MOUTH DAILY. 90 tablet 1  . hyoscyamine (ANASPAZ) 0.125 MG TBDP disintergrating tablet PLACE 1 TABLET UNDER THE TONGUE EVERY 4 HOURS AS NEEDED. 30 tablet 0  . hyoscyamine (LEVSIN) 0.125 MG tablet TAKE 1 TABLET (0.125 MG TOTAL) BY MOUTH EVERY 4 (FOUR) HOURS AS NEEDED. 30 tablet 1  . hyoscyamine (OSCIMIN) 0.125 MG TBDP disintergrating tablet Place 1 tablet (0.125 mg total) under the tongue every 4 (four) hours as needed. 30 tablet 0  . ibuprofen (ADVIL,MOTRIN) 600 MG tablet Take 1 tablet (600 mg total) by mouth every 8 (eight) hours as needed. 30 tablet 0  . losartan (COZAAR) 100 MG tablet Take 1 tablet (100 mg total) by mouth daily. 30 tablet 2  . montelukast (SINGULAIR) 10 MG tablet Take 1 tablet (10 mg total) by mouth at bedtime. 30 tablet 2  . Multiple Vitamin (MULTIVITAMIN WITH MINERALS) TABS tablet Take 1 tablet by mouth daily.    . Na Sulfate-K Sulfate-Mg Sulf 17.5-3.13-1.6 GM/177ML SOLN USE AS DIRECTED. 354 mL 0  . OSCIMIN 0.125 MG tablet TAKE 1 TABLET (0.125 MG TOTAL) BY MOUTH EVERY 4 (FOUR) HOURS AS NEEDED. 30 tablet 1  . sildenafil (VIAGRA) 100 MG tablet TAKE 1 TABLET BY MOUTH AS NEEDED FOR ERECTILE DYSFUNCTION. 5 tablet 11  . valACYclovir (VALTREX) 500 MG tablet TAKE 1 TABLET BY MOUTH DAILY. 90 tablet 1  . valACYclovir (VALTREX) 500 MG tablet TAKE 1 TABLET BY MOUTH DAILY. 90 tablet 1   No current facility-administered medications for this visit.    REVIEW OF SYSTEMS:  [X]  denotes positive finding, [ ]  denotes negative finding Cardiac  Comments:  Chest pain or chest pressure:    Shortness of breath upon exertion:    Short of breath when lying flat:    Irregular heart rhythm:        Vascular    Pain in calf, thigh, or hip brought on by ambulation:    Pain in feet at night that wakes you up from your  sleep:     Blood clot in your veins:    Leg swelling:         Pulmonary    Oxygen at home:    Productive cough:     Wheezing:         Neurologic    Sudden weakness in arms or legs:     Sudden numbness in arms or legs:     Sudden onset of difficulty speaking or slurred speech:    Temporary loss of vision in one eye:     Problems with dizziness:  Gastrointestinal    Blood in stool:     Vomited blood:         Genitourinary    Burning when urinating:     Blood in urine:        Psychiatric    Major depression:         Hematologic    Bleeding problems:    Problems with blood clotting too easily:        Skin    Rashes or ulcers:        Constitutional    Fever or chills:     PHYSICAL EXAM:   Vitals:   09/13/20 1513  BP: 138/83  Pulse: 78  Resp: 16  Temp: (!) 97.3 F (36.3 C)  TempSrc: Temporal  SpO2: 100%  Weight: (!) 361 lb (163.7 kg)  Height: 6\' 8"  (2.032 m)    GENERAL: The patient is a well-nourished male, in no acute distress. The vital signs are documented above. CARDIAC: There is a regular rate and rhythm.  VASCULAR: I do not detect carotid bruits. He has palpable dorsalis pedis and posterior tibial pulses bilaterally. He has some mild hyperpigmentation bilaterally. PULMONARY: There is good air exchange bilaterally without wheezing or rales. ABDOMEN: Soft and non-tender with normal pitched bowel sounds.  MUSCULOSKELETAL: There are no major deformities or cyanosis. NEUROLOGIC: No focal weakness or paresthesias are detected. SKIN: There are no ulcers or rashes noted. PSYCHIATRIC: The patient has a normal affect.  DATA:    VENOUS DUPLEX: I have independently interpreted his duplex of the left lower extremity.  He has no evidence of DVT.  He has deep venous reflux involving the common femoral vein and popliteal vein.  The left great saphenous vein has been successfully ablated.  There is a short segment of reflux at the knee and in the distal  calf however the vein here is not dilated.  There is a short segment of reflux in the mid calf and this small saphenous vein although the vein is not dilated here.  William Franco [facility] [phone]

## 2020-10-16 ENCOUNTER — Other Ambulatory Visit (HOSPITAL_COMMUNITY): Payer: Self-pay

## 2020-10-16 ENCOUNTER — Other Ambulatory Visit: Payer: Self-pay | Admitting: Family Medicine

## 2020-10-16 ENCOUNTER — Other Ambulatory Visit: Payer: Self-pay | Admitting: Student

## 2020-10-16 MED ORDER — HYDROCHLOROTHIAZIDE 25 MG PO TABS
25.0000 mg | ORAL_TABLET | Freq: Every day | ORAL | 3 refills | Status: DC
Start: 2020-10-16 — End: 2021-11-14
  Filled 2020-10-16: qty 90, 90d supply, fill #0
  Filled 2021-01-29 – 2021-02-11 (×2): qty 90, 90d supply, fill #1
  Filled 2021-07-01 – 2021-07-22 (×3): qty 90, 90d supply, fill #2

## 2020-10-16 MED FILL — Amlodipine Besylate Tab 10 MG (Base Equivalent): ORAL | 90 days supply | Qty: 90 | Fill #0 | Status: AC

## 2021-01-29 ENCOUNTER — Other Ambulatory Visit (HOSPITAL_COMMUNITY): Payer: Self-pay

## 2021-01-29 ENCOUNTER — Other Ambulatory Visit: Payer: Self-pay | Admitting: Family Medicine

## 2021-01-29 ENCOUNTER — Other Ambulatory Visit: Payer: Self-pay | Admitting: Student

## 2021-01-29 MED ORDER — AMLODIPINE BESYLATE 10 MG PO TABS
10.0000 mg | ORAL_TABLET | Freq: Every day | ORAL | 0 refills | Status: DC
Start: 2021-01-29 — End: 2021-04-02
  Filled 2021-01-29 – 2021-02-11 (×2): qty 90, 90d supply, fill #0

## 2021-02-06 ENCOUNTER — Other Ambulatory Visit (HOSPITAL_COMMUNITY): Payer: Self-pay

## 2021-02-11 ENCOUNTER — Other Ambulatory Visit: Payer: Self-pay | Admitting: Student

## 2021-02-11 ENCOUNTER — Other Ambulatory Visit (HOSPITAL_COMMUNITY): Payer: Self-pay

## 2021-02-12 ENCOUNTER — Other Ambulatory Visit: Payer: Self-pay | Admitting: Internal Medicine

## 2021-02-12 ENCOUNTER — Other Ambulatory Visit (HOSPITAL_COMMUNITY): Payer: Self-pay

## 2021-02-13 ENCOUNTER — Other Ambulatory Visit (HOSPITAL_COMMUNITY): Payer: Self-pay

## 2021-02-14 ENCOUNTER — Other Ambulatory Visit: Payer: Self-pay | Admitting: Internal Medicine

## 2021-02-14 ENCOUNTER — Other Ambulatory Visit (HOSPITAL_COMMUNITY): Payer: Self-pay

## 2021-02-22 ENCOUNTER — Other Ambulatory Visit: Payer: Self-pay | Admitting: Family Medicine

## 2021-02-22 ENCOUNTER — Other Ambulatory Visit (HOSPITAL_COMMUNITY): Payer: Self-pay

## 2021-02-22 MED ORDER — LOSARTAN POTASSIUM 100 MG PO TABS
100.0000 mg | ORAL_TABLET | Freq: Every day | ORAL | 0 refills | Status: DC
Start: 2021-02-22 — End: 2021-03-25
  Filled 2021-02-22: qty 30, 30d supply, fill #0

## 2021-03-25 ENCOUNTER — Other Ambulatory Visit (HOSPITAL_COMMUNITY): Payer: Self-pay

## 2021-03-25 ENCOUNTER — Other Ambulatory Visit: Payer: Self-pay | Admitting: Family Medicine

## 2021-03-25 MED ORDER — LOSARTAN POTASSIUM 100 MG PO TABS
100.0000 mg | ORAL_TABLET | Freq: Every day | ORAL | 0 refills | Status: DC
Start: 2021-03-25 — End: 2021-07-01
  Filled 2021-03-25: qty 30, 30d supply, fill #0

## 2021-04-02 ENCOUNTER — Other Ambulatory Visit (HOSPITAL_COMMUNITY): Payer: Self-pay

## 2021-04-02 ENCOUNTER — Other Ambulatory Visit: Payer: Self-pay

## 2021-04-02 ENCOUNTER — Ambulatory Visit (INDEPENDENT_AMBULATORY_CARE_PROVIDER_SITE_OTHER): Payer: 59 | Admitting: Family Medicine

## 2021-04-02 DIAGNOSIS — R109 Unspecified abdominal pain: Secondary | ICD-10-CM

## 2021-04-02 DIAGNOSIS — Z720 Tobacco use: Secondary | ICD-10-CM | POA: Diagnosis not present

## 2021-04-02 DIAGNOSIS — E785 Hyperlipidemia, unspecified: Secondary | ICD-10-CM | POA: Diagnosis not present

## 2021-04-02 DIAGNOSIS — I10 Essential (primary) hypertension: Secondary | ICD-10-CM

## 2021-04-02 MED ORDER — VALACYCLOVIR HCL 500 MG PO TABS
500.0000 mg | ORAL_TABLET | Freq: Every day | ORAL | 1 refills | Status: DC
  Filled 2021-04-02: qty 90, 90d supply, fill #0

## 2021-04-02 MED ORDER — ALBUTEROL SULFATE HFA 108 (90 BASE) MCG/ACT IN AERS
2.0000 | INHALATION_SPRAY | Freq: Four times a day (QID) | RESPIRATORY_TRACT | 2 refills | Status: DC | PRN
  Filled 2021-04-02: qty 18, 25d supply, fill #0

## 2021-04-02 MED ORDER — SILDENAFIL CITRATE 100 MG PO TABS
100.0000 mg | ORAL_TABLET | ORAL | 11 refills | Status: DC | PRN
  Filled 2021-04-02 – 2021-07-22 (×2): qty 5, 30d supply, fill #0

## 2021-04-02 NOTE — Assessment & Plan Note (Signed)
Stable with intermittent rare levsin for cramps

## 2021-04-02 NOTE — Assessment & Plan Note (Signed)
Recheck labs.  Might need to recommend a statin

## 2021-04-02 NOTE — Assessment & Plan Note (Signed)
Stopped 6 weeks or so ago.  Encouraged to find healthier substitutes

## 2021-04-02 NOTE — Patient Instructions (Signed)
Good to see you today - Thank you for coming in  Things we discussed today:  Great work on not smoking.  Find healthy substitutes   I will call you if your tests are not good.  Otherwise, I will send you a message on MyChart (if it is active) or a letter in the mail..  If you do not hear from me with in 2 weeks please call our office.     Your goal blood pressure is less than 140/90.  Check your blood pressure several times a week.  If regularly higher than this please let me know - either with MyChart or leaving a phone message. Next visit please bring in your blood pressure cuff.     Please always bring your medication bottles  Come back to see me in 6-12 months

## 2021-04-02 NOTE — Assessment & Plan Note (Signed)
BP Readings from Last 3 Encounters:  04/02/21 135/87  09/13/20 138/83  09/07/20 (!) 160/103   At goal continue current medications.  Check labs

## 2021-04-02 NOTE — Progress Notes (Signed)
    SUBJECTIVE:   CHIEF COMPLAINT / HPI:    Hypertension - does not check at work.  Takes his medications regularly and knows them.  No lightheadness or chest pain or new edema  Abdominal Pain - overall improved.  Intermittently has mild pain and takes a levsin  No interference with activities or appetite.  No weight loss or nausea and vomiting.     Smoking - Stopped completely about 6 weeks ago.  Has cravings and eats snack food.  Overall he is happy he stopped    Weight - is exercising regularly  PERTINENT  PMH / PSH: His wedding is in March   OBJECTIVE:   BP 135/87   Pulse 78   Wt (!) 357 lb (161.9 kg)   SpO2 100%   BMI 39.22 kg/m   Heart - Regular rate and rhythm.  No murmurs, gallops or rubs.    Lungs:  Normal respiratory effort, chest expands symmetrically. Lungs are clear to auscultation, no crackles or wheezes. Extremities:  No cyanosis, edema, or deformity noted with good range of motion of all major joints.     ASSESSMENT/PLAN:   Hypertension BP Readings from Last 3 Encounters:  04/02/21 135/87  09/13/20 138/83  09/07/20 (!) 160/103   At goal continue current medications.  Check labs   Tobacco abuse Stopped 6 weeks or so ago.  Encouraged to find healthier substitutes   Pain in the abdomen Stable with intermittent rare levsin for cramps   Hyperlipidemia Recheck labs.  Might need to recommend a statin      Lind Covert, MD [place]

## 2021-04-03 LAB — LIPID PANEL
Chol/HDL Ratio: 5.2 ratio — ABNORMAL HIGH (ref 0.0–5.0)
Cholesterol, Total: 191 mg/dL (ref 100–199)
HDL: 37 mg/dL — ABNORMAL LOW (ref >39)
LDL Chol Calc (NIH): 119 mg/dL — ABNORMAL HIGH (ref 0–99)
Triglycerides: 197 mg/dL — ABNORMAL HIGH (ref 0–149)
VLDL Cholesterol Cal: 35 mg/dL (ref 5–40)

## 2021-04-03 LAB — BASIC METABOLIC PANEL
BUN/Creatinine Ratio: 13 (ref 9–20)
BUN: 12 mg/dL (ref 6–24)
CO2: 25 mmol/L (ref 20–29)
Calcium: 8.9 mg/dL (ref 8.7–10.2)
Chloride: 100 mmol/L (ref 96–106)
Creatinine, Ser: 0.91 mg/dL (ref 0.76–1.27)
Glucose: 128 mg/dL — ABNORMAL HIGH (ref 70–99)
Potassium: 3.7 mmol/L (ref 3.5–5.2)
Sodium: 139 mmol/L (ref 134–144)
eGFR: 105 mL/min/{1.73_m2} (ref >59)

## 2021-04-05 ENCOUNTER — Encounter: Payer: Self-pay | Admitting: Family Medicine

## 2021-04-05 ENCOUNTER — Other Ambulatory Visit (HOSPITAL_COMMUNITY): Payer: Self-pay

## 2021-04-05 MED ORDER — ROSUVASTATIN CALCIUM 10 MG PO TABS
10.0000 mg | ORAL_TABLET | Freq: Every day | ORAL | 3 refills | Status: DC
Start: 2021-04-05 — End: 2021-12-18
  Filled 2021-04-05 – 2021-07-11 (×3): qty 90, 90d supply, fill #0

## 2021-04-10 ENCOUNTER — Other Ambulatory Visit (HOSPITAL_COMMUNITY): Payer: Self-pay

## 2021-05-22 ENCOUNTER — Other Ambulatory Visit: Payer: Self-pay

## 2021-05-22 ENCOUNTER — Encounter: Payer: Self-pay | Admitting: Podiatry

## 2021-05-22 ENCOUNTER — Ambulatory Visit (INDEPENDENT_AMBULATORY_CARE_PROVIDER_SITE_OTHER): Payer: 59

## 2021-05-22 ENCOUNTER — Ambulatory Visit: Payer: 59 | Admitting: Podiatry

## 2021-05-22 DIAGNOSIS — M21619 Bunion of unspecified foot: Secondary | ICD-10-CM

## 2021-05-22 DIAGNOSIS — M21611 Bunion of right foot: Secondary | ICD-10-CM | POA: Diagnosis not present

## 2021-05-22 DIAGNOSIS — M21612 Bunion of left foot: Secondary | ICD-10-CM

## 2021-05-22 DIAGNOSIS — L84 Corns and callosities: Secondary | ICD-10-CM

## 2021-05-22 NOTE — Progress Notes (Signed)
Subjective:   Patient ID: William Franco, male   DOB: 46 y.o.   MRN: [id number]   HPI Patient states the bunion on the right foot is getting worse and he wants to have it fixed as it is hard for him to work and weight-bear but he is getting married the end of March and would like to do it after that.  States it also has callus on the big toe which is very sore and is concerned about hammertoe deformity second digit left foot   ROS      Objective:  Physical Exam  Neurovascular status intact with patient found to have moderate structural bunion deformity right hallux interphalange ES right with moderate varus rotation of the toe frontal plane and has digital deformity second left with elevation of the digit noted.  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  Explained there is no guarantee this will get rid of the callus but it is the best chance that he has and is willing to do this and wants surgical intervention.  At this point we will go over consent form in mid March I debrided lesion fully and wanted to reevaluate him.  Discussed hammertoe second left which may have to be done in future  X-rays indicate that there is significant structural malalignment with flatfoot deformity noted and inner phalangeal deformity noted

## 2021-07-01 ENCOUNTER — Other Ambulatory Visit: Payer: Self-pay | Admitting: Family Medicine

## 2021-07-01 ENCOUNTER — Other Ambulatory Visit (HOSPITAL_COMMUNITY): Payer: Self-pay

## 2021-07-01 MED ORDER — LOSARTAN POTASSIUM 100 MG PO TABS
100.0000 mg | ORAL_TABLET | Freq: Every day | ORAL | 1 refills | Status: DC
  Filled 2021-07-01: qty 90, 90d supply, fill #0
  Filled 2021-11-14: qty 90, 90d supply, fill #1

## 2021-07-01 MED ORDER — AMLODIPINE BESYLATE 10 MG PO TABS
10.0000 mg | ORAL_TABLET | Freq: Every day | ORAL | 1 refills | Status: DC
  Filled 2021-07-01: qty 90, 90d supply, fill #0
  Filled 2021-11-14: qty 90, 90d supply, fill #1

## 2021-07-09 ENCOUNTER — Telehealth: Payer: Self-pay | Admitting: Urology

## 2021-07-09 NOTE — Telephone Encounter (Signed)
DOS - 08/06/21 ? ?[place] BUNIONECTOMY RIGHT --- [phone] ?[facility] OSTEOTOMY RIGHT --- [phone] ? ?[facility] EFFECTIVE DATE - 04/28/21 ? ?PLAN DEDUCTIBLE - $350.00 W/ $0.00 ?OUT OF POCKET - $7900.00 W/ $7,550.00 REMAINING ?COINSURANCE - 40% ?COPAY - $0.00 ? ? ?SPOKE WITH BELL WITH [facility] AND SHE STATED THAT CPT CODES 35686 AND 16837 NO PRIOR AUTH IS REQUIRED. ? ?REF # [id number] ?

## 2021-07-10 ENCOUNTER — Ambulatory Visit: Payer: 59 | Admitting: Podiatry

## 2021-07-11 ENCOUNTER — Other Ambulatory Visit (HOSPITAL_COMMUNITY): Payer: Self-pay

## 2021-07-19 ENCOUNTER — Other Ambulatory Visit (HOSPITAL_COMMUNITY): Payer: Self-pay

## 2021-07-22 ENCOUNTER — Other Ambulatory Visit (HOSPITAL_COMMUNITY): Payer: Self-pay

## 2021-07-25 ENCOUNTER — Ambulatory Visit: Payer: 59 | Admitting: Podiatry

## 2021-07-25 ENCOUNTER — Encounter: Payer: Self-pay | Admitting: Podiatry

## 2021-07-25 DIAGNOSIS — L84 Corns and callosities: Secondary | ICD-10-CM | POA: Diagnosis not present

## 2021-07-25 DIAGNOSIS — M21619 Bunion of unspecified foot: Secondary | ICD-10-CM | POA: Diagnosis not present

## 2021-07-25 NOTE — Progress Notes (Signed)
Subjective:  ? ?Patient ID: William Franco, male   DOB: 46 y.o.   MRN: [id number]  ? ?HPI ?Patient presents stating that he wants surgery but he needs to hold off wants to review the recovery and get calluses trimmed ? ? ?ROS ? ? ?   ?Objective:  ?Physical Exam  ?Neurovascular status intact with patient's structural first metatarsal and large rotation of the big toe and plantar left medial keratotic lesion painful when pressed ? ?   ?Assessment:  ?Actual deformity right over left foot ? ?   ?Plan:  ?Reviewed his x-rays with him indicating the elevation of the intermetatarsal angle frontal plane rotation discussed the structural correction reviewed recovery and that I will be 8 to 12 weeks before he will be for the most part normal and even then there still be some swelling.  He understands all this we will plan on surgery in May after his honeymoon and reviewed again what will be necessary with debridement accomplished today ?   ? ? ?

## 2021-08-12 ENCOUNTER — Encounter: Payer: 59 | Admitting: Podiatry

## 2021-08-29 ENCOUNTER — Encounter: Payer: Self-pay | Admitting: Family Medicine

## 2021-08-29 ENCOUNTER — Other Ambulatory Visit (HOSPITAL_COMMUNITY): Payer: Self-pay

## 2021-08-29 ENCOUNTER — Ambulatory Visit: Payer: 59 | Admitting: Family Medicine

## 2021-08-29 VITALS — BP 130/70 | HR 73 | Ht >= 80 in | Wt 352.0 lb

## 2021-08-29 DIAGNOSIS — G44001 Cluster headache syndrome, unspecified, intractable: Secondary | ICD-10-CM | POA: Diagnosis not present

## 2021-08-29 MED ORDER — SUMATRIPTAN SUCCINATE 25 MG PO TABS
25.0000 mg | ORAL_TABLET | ORAL | 0 refills | Status: DC | PRN
  Filled 2021-08-29: qty 10, 30d supply, fill #0

## 2021-08-29 NOTE — Progress Notes (Signed)
? ? ? ?  SUBJECTIVE:  ? ?CHIEF COMPLAINT / HPI: headaches ? ?New onset headache x 2 days.  Left frontal shooting pain that comes and goes and fluctuates in intensity.  Also noted to have watering of left eye when having headaches.  Headache lasts few mins to prolonged time. Endorses photophobia.  Denies any slurred speech, visual disturbance, nausea, vomiting, numbness/tingling or weakness.  No LOC.  Has tried Ibuprofen which helped a little. Has not had similar headaches in past and no family history of migraine/cluster headache that he is aware of.  No recent use of Phosphodiesterase-5 Inhibitor.  Sleeps 5 hrs night.  Hydrates with 2-3 bottles H2O.   ? ?PERTINENT  PMH / PSH:  ?HTN ?Tobacco use ? ?OBJECTIVE:  ? ?BP 130/70   Pulse 73   Ht '6\' 8"'$  (2.032 m)   Wt (!) 352 lb (159.7 kg)   SpO2 98%   BMI 38.67 kg/m?   ? ?General: Alert, no acute distress ?Cardio: Normal S1 and S2, RRR, no r/m/g ?Pulm: CTAB, normal work of breathing ?Neuro: ?CN II: PERRL, no papilledema, AV nicking appreciated on fundoscopic exam ?CN III, IV,VI: EOMI ?CV V: Normal sensation in V1, V2, V3 ?CVII: Symmetric smile and brow raise ?CN VIII: Normal hearing ?CN IX,X: Symmetric palate raise  ?CN XI: 5/5 shoulder shrug ?CN XII: Symmetric tongue protrusion  ?UE and LE strength 5/5 ?Normal sensation in UE and LE bilaterally  ?No ataxia with finger to nose, normal heel to shin  ?Negative Rhomberg   ? ? ?ASSESSMENT/PLAN:  ? ?Cluster headache, intractable ?Low suspicion for TIA/CVA given no focal deficits on neuro exam.  Symptoms consistent with cluster headache.  Risk factors include age, male, smoking history and history of seasonal allergies.   ?Start Sumatriptan 25 mg at onset of headache, can repeat in 2 hrs if no improvement.  Max dose 50 mg/d ?Encouraged smoking cessation, declined patch today ?Follow up with PCP in 1 week  ?Strict return precautions provided ?  ? ?Carollee Leitz, MD ?[place]  ?

## 2021-08-29 NOTE — Patient Instructions (Signed)
Cluster Headache Cluster headaches hurt a lot. They normally happen on one side of your head, but they may switch sides. Often, cluster headaches: Cause a lot of pain. Happen for weeks to months. Last from 15 minutes to 3 hours. Happen at the same time each day. Happen at night. Happen many times a day. Happen more often in the fall and springtime. What are the causes? The exact cause is not known. They are not usually caused by foods, changes in body chemicals (hormonal changes), or stress. What increases the risk? Being a male between the ages of 20-50 years old. Smoking or using products that contain nicotine or tobacco. Having elevated levels of body chemical called histamine. This can happen in people who have allergies. Taking certain medicines that cause blood vessels to expand. Having a parent or brother or sister who has cluster headaches. What are the signs or symptoms? Very bad pain on one side of the head that begins behind or around your eye but may spread to your face, head, and neck. Feeling like you may vomit (nauseous). Being sensitive to light. Runny nose and stuffy nose. Swelling of the forehead or face on the affected side. Eye problems. This might include a droopy or swollen eyelid, eye redness, or tearing on the affected side. Feeling restless or upset. Pale skin or a flushed face. How is this treated? Medicines. Oxygen that is breathed in through a mask. Follow these instructions at home: Headache diary Keep a headache diary as told by your doctor. Doing this can help you and your doctor figure out what triggers your headaches. In your headache diary, include information about: The time of day that your headache started and what you were doing when it began. How long your headache lasted. Where your pain started and whether it moved to other areas. The type of pain. Your level of pain. Use a pain scale and rate the pain with a number from 1 (mild) up to 10  (very bad). The treatment that you used, and any change in symptoms after treatment.  Medicines Take over-the-counter and prescription medicines only as told by your health care provider. Ask your doctor if the medicine prescribed to you: Requires you to avoid driving or using machinery. Can cause trouble pooping (constipation). You may need to take these actions to prevent or treat trouble pooping: Drink enough fluid to keep your pee (urine) pale yellow. Take over-the-counter or prescription medicines. Eat foods that are high in fiber. These include beans, whole grains, and fresh fruits and vegetables. Limit foods that are high in fat and processed sugars. These include fried or sweet foods. Lifestyle  Go to bed at the same time each night. Get the same amount of sleep every night. Get 7-9 hours of sleep each night, or the amount recommended by your doctor. Limit or manage stress. Exercise regularly. Exercise for at least 30 minutes, 5 times each week. Eat a healthy diet. Avoid any foods that you know may trigger your headaches. Do not drink alcohol. Do not use any products that contain nicotine or tobacco, such as cigarettes, e-cigarettes, and chewing tobacco. If you need help quitting, ask your doctor. General instructions Use oxygen as told by your doctor. Keep all follow-up visits as told by your health care provider. This is important. Contact a doctor if: Your headaches: Change. Get worse. Happen more often. Your medicines or oxygen are not helping. Get help right away if: You faint. You get weak or lose feeling (have   numbness) on one side of your body or face. You see two of everything (double vision). You feel you may vomit or you vomit and it does stop after many hours. You have trouble with your balance or with walking. You have trouble talking. You have neck pain or stiffness and you have a fever. Summary Cluster headaches hurt a lot. Keep a headache diary. Do not  drink alcohol. Medicines and oxygen may help you feel better. This information is not intended to replace advice given to you by your health care provider. Make sure you discuss any questions you have with your health care provider. Document Revised: 12/16/2019 Document Reviewed: 05/19/2019 Elsevier Patient Education  2023 Elsevier Inc.  

## 2021-08-31 ENCOUNTER — Encounter: Payer: Self-pay | Admitting: Family Medicine

## 2021-08-31 DIAGNOSIS — G44001 Cluster headache syndrome, unspecified, intractable: Secondary | ICD-10-CM | POA: Insufficient documentation

## 2021-08-31 NOTE — Assessment & Plan Note (Signed)
Low suspicion for TIA/CVA given no focal deficits on neuro exam.  Symptoms consistent with cluster headache.  Risk factors include age, male, smoking history and history of seasonal allergies.   ?Start Sumatriptan 25 mg at onset of headache, can repeat in 2 hrs if no improvement.  Max dose 50 mg/d ?Encouraged smoking cessation, declined patch today ?Follow up with PCP in 1 week  ?Strict return precautions provided ?

## 2021-10-01 ENCOUNTER — Encounter: Payer: Self-pay | Admitting: *Deleted

## 2021-10-02 ENCOUNTER — Encounter (HOSPITAL_COMMUNITY): Payer: Self-pay | Admitting: Emergency Medicine

## 2021-10-02 ENCOUNTER — Ambulatory Visit (HOSPITAL_COMMUNITY)
Admission: EM | Admit: 2021-10-02 | Discharge: 2021-10-02 | Disposition: A | Discharge status: Home / Self Care | Payer: 59 | Attending: Family Medicine | Admitting: Family Medicine

## 2021-10-02 DIAGNOSIS — R300 Dysuria: Secondary | ICD-10-CM | POA: Diagnosis not present

## 2021-10-02 DIAGNOSIS — I10 Essential (primary) hypertension: Secondary | ICD-10-CM

## 2021-10-02 LAB — POCT URINALYSIS DIPSTICK, ED / UC
Bilirubin Urine: NEGATIVE
Glucose, UA: NEGATIVE mg/dL
Hgb urine dipstick: NEGATIVE
Ketones, ur: NEGATIVE mg/dL
Leukocytes,Ua: NEGATIVE
Nitrite: NEGATIVE
Protein, ur: NEGATIVE mg/dL
Specific Gravity, Urine: >=1.03 (ref 1.005–1.030)
Urobilinogen, UA: 0.2 mg/dL (ref 0.0–1.0)
pH: 5.5 (ref 5.0–8.0)

## 2021-10-02 NOTE — ED Triage Notes (Signed)
Pt is present today with c/o dysuria, hematuria, and irritation. Pt sx started last week

## 2021-10-02 NOTE — Discharge Instructions (Signed)
Your blood pressure was noted to be elevated during your visit today. If you are currently taking medication for high blood pressure, please ensure you are taking this as directed. If you do not have a history of high blood pressure and your blood pressure remains persistently elevated, you may need to begin taking a medication at some point. You may return here within the next few days to recheck if unable to see your primary care provider or if you do not have a one.  BP (!) 161/96   Pulse 80   Temp 98.4 F (36.9 C)   Resp 18   SpO2 98%   BP Readings from Last 3 Encounters:  10/02/21 (!) 161/96  08/29/21 130/70  04/02/21 135/87

## 2021-10-02 NOTE — ED Provider Notes (Signed)
[facility]    ASSESSMENT & PLAN:  1. Dysuria   2. Elevated blood pressure reading in office with diagnosis of hypertension    Feeling well. U/A is normal. He is comfortable with home observation.    Discharge Instructions      Your blood pressure was noted to be elevated during your visit today. If you are currently taking medication for high blood pressure, please ensure you are taking this as directed. If you do not have a history of high blood pressure and your blood pressure remains persistently elevated, you may need to begin taking a medication at some point. You may return here within the next few days to recheck if unable to see your primary care provider or if you do not have a one.  BP (!) 161/96   Pulse 80   Temp 98.4 F (36.9 C)   Resp 18   SpO2 98%   BP Readings from Last 3 Encounters:  10/02/21 (!) 161/96  08/29/21 130/70  04/02/21 135/87         Follow-up Information     Lind Covert, MD.   Specialty: Family Medicine Why: to recheck your blood pressure Contact information: [place] [address] [phone]                May return here as needed.  Outlined signs and symptoms indicating need for more acute intervention. Patient verbalized understanding. After Visit Summary given.  SUBJECTIVE:  William Franco is a 46 y.o. male who reports noting "a little blood" in his urine a few d ago. Mild dysuria. Took amox '500mg'$  BID for 2 days. Feels well now. Afebrile. No n/v/d. Ambulatory. No abd pain.  Increased blood pressure noted today. Reports that he is treated for hypertension in the past. He reports taking medications as instructed, no chest pain on exertion, no dyspnea on exertion, no swelling of ankles, no orthostatic dizziness or lightheadedness, no orthopnea or paroxysmal nocturnal dyspnea, and no palpitations.   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  Family  in [place] Strain: Not on file  Food Insecurity: Not on file  Transportation Needs: Not on file  Physical Activity: Not on file  Stress: Not on file  Social Connections: Not on file  Intimate Partner Violence: Not on file   Family History  Problem Relation Age of Onset   Hypertension Father    Hypertension Mother    Colon cancer Neg Hx    Colon polyps Neg Hx    Esophageal cancer Neg Hx    Rectal cancer Neg Hx    Stomach cancer Neg Hx         Vanessa Kick, MD 10/02/21 1434

## 2021-11-14 ENCOUNTER — Other Ambulatory Visit (HOSPITAL_COMMUNITY): Payer: Self-pay

## 2021-11-14 ENCOUNTER — Other Ambulatory Visit: Payer: Self-pay | Admitting: Family Medicine

## 2021-11-14 MED ORDER — HYDROCHLOROTHIAZIDE 25 MG PO TABS
25.0000 mg | ORAL_TABLET | Freq: Every day | ORAL | 2 refills | Status: DC
Start: 2021-11-14 — End: 2021-12-18
  Filled 2021-11-14: qty 90, 90d supply, fill #0

## 2021-12-02 ENCOUNTER — Encounter: Payer: Self-pay | Admitting: Podiatry

## 2021-12-02 ENCOUNTER — Ambulatory Visit: Payer: 59 | Admitting: Podiatry

## 2021-12-02 ENCOUNTER — Ambulatory Visit (INDEPENDENT_AMBULATORY_CARE_PROVIDER_SITE_OTHER): Payer: 59

## 2021-12-02 DIAGNOSIS — M21619 Bunion of unspecified foot: Secondary | ICD-10-CM

## 2021-12-02 DIAGNOSIS — M21611 Bunion of right foot: Secondary | ICD-10-CM

## 2021-12-02 NOTE — Progress Notes (Signed)
Subjective:   Patient ID: William Franco, male   DOB: 46 y.o.   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  He wants this done and I allowed him to read consent form going over alternative treatments complications associated with surgery he understands no guarantee as far as success all complications can occur and he is willing to accept the risk of this and after extensive review signed consent form.  I also dispensed a air fracture walker that I want him to get used to prior to surgery going over with him the usage of this and fitting it properly to his lower leg.  Patient will be seen back encouraged to call questions concerns which may come up understanding total recovery can take upwards of 6 months with x-rays redone today as its been over 6 months  X-rays indicate that there is significant structural bunion with some reactivity around the first metatarsal head right foot and pressure between the hallux and second toe right over left

## 2021-12-11 ENCOUNTER — Encounter: Payer: 59 | Admitting: Family Medicine

## 2021-12-11 NOTE — Progress Notes (Deleted)
    SUBJECTIVE:   CHIEF COMPLAINT / HPI:    Hypertension - does not check at work.  Takes his medications regularly and knows them.  No lightheadness or chest pain or new edema   Abdominal Pain - overall improved.  Intermittently has mild pain and takes a levsin  No interference with activities or appetite.  No weight loss or nausea and vomiting.     Smoking - Stopped completely about 6 weeks ago.  Has cravings and eats snack food.  Overall he is happy he stopped    Pre diabetes Weight - is exercising regularly  PERTINENT  PMH / PSH: ***  OBJECTIVE:   There were no vitals taken for this visit.  ***  ASSESSMENT/PLAN:   No problem-specific Assessment & Plan notes found for this encounter.     Lind Covert, MD [place]

## 2021-12-18 ENCOUNTER — Other Ambulatory Visit (HOSPITAL_COMMUNITY): Payer: Self-pay

## 2021-12-18 ENCOUNTER — Other Ambulatory Visit: Payer: Self-pay

## 2021-12-18 ENCOUNTER — Ambulatory Visit (INDEPENDENT_AMBULATORY_CARE_PROVIDER_SITE_OTHER): Payer: 59 | Admitting: Family Medicine

## 2021-12-18 ENCOUNTER — Encounter: Payer: Self-pay | Admitting: Family Medicine

## 2021-12-18 VITALS — BP 140/97 | HR 80 | Wt 352.6 lb

## 2021-12-18 DIAGNOSIS — R7303 Prediabetes: Secondary | ICD-10-CM

## 2021-12-18 DIAGNOSIS — E785 Hyperlipidemia, unspecified: Secondary | ICD-10-CM | POA: Diagnosis not present

## 2021-12-18 DIAGNOSIS — Z125 Encounter for screening for malignant neoplasm of prostate: Secondary | ICD-10-CM | POA: Diagnosis not present

## 2021-12-18 DIAGNOSIS — I10 Essential (primary) hypertension: Secondary | ICD-10-CM | POA: Diagnosis not present

## 2021-12-18 DIAGNOSIS — Z720 Tobacco use: Secondary | ICD-10-CM | POA: Diagnosis not present

## 2021-12-18 LAB — POCT GLYCOSYLATED HEMOGLOBIN (HGB A1C): HbA1c, POC (prediabetic range): 5.8 % (ref 5.7–6.4)

## 2021-12-18 MED ORDER — AMLODIPINE BESYLATE 10 MG PO TABS
10.0000 mg | ORAL_TABLET | Freq: Every day | ORAL | 1 refills | Status: DC
  Filled 2021-12-18 – 2022-03-24 (×2): qty 90, 90d supply, fill #0
  Filled 2022-07-14: qty 30, 30d supply, fill #1
  Filled 2022-09-11: qty 30, 30d supply, fill #2

## 2021-12-18 MED ORDER — HYDROCHLOROTHIAZIDE 25 MG PO TABS
25.0000 mg | ORAL_TABLET | Freq: Every day | ORAL | 2 refills | Status: DC
  Filled 2021-12-18 – 2022-03-24 (×2): qty 90, 90d supply, fill #0
  Filled 2022-07-14: qty 30, 30d supply, fill #1
  Filled 2022-09-11: qty 30, 30d supply, fill #2

## 2021-12-18 MED ORDER — ROSUVASTATIN CALCIUM 10 MG PO TABS
10.0000 mg | ORAL_TABLET | Freq: Every day | ORAL | 3 refills | Status: DC
  Filled 2021-12-18 – 2022-01-09 (×2): qty 90, 90d supply, fill #0
  Filled 2022-04-25: qty 90, 90d supply, fill #1
  Filled 2022-07-14: qty 30, 30d supply, fill #1
  Filled 2022-09-11: qty 30, 30d supply, fill #2

## 2021-12-18 MED ORDER — SILDENAFIL CITRATE 100 MG PO TABS
100.0000 mg | ORAL_TABLET | ORAL | 11 refills | Status: DC | PRN
  Filled 2021-12-18 – 2022-01-09 (×2): qty 5, 30d supply, fill #0

## 2021-12-18 MED ORDER — LOSARTAN POTASSIUM 100 MG PO TABS
100.0000 mg | ORAL_TABLET | Freq: Every day | ORAL | 1 refills | Status: DC
  Filled 2021-12-18 – 2022-03-24 (×2): qty 90, 90d supply, fill #0
  Filled 2022-07-14: qty 30, 30d supply, fill #1
  Filled 2022-09-11: qty 30, 30d supply, fill #2

## 2021-12-18 MED ORDER — ALBUTEROL SULFATE HFA 108 (90 BASE) MCG/ACT IN AERS
2.0000 | INHALATION_SPRAY | Freq: Four times a day (QID) | RESPIRATORY_TRACT | 2 refills | Status: DC | PRN
  Filled 2021-12-18: qty 6.7, 25d supply, fill #0

## 2021-12-18 NOTE — Patient Instructions (Signed)
Good to see you today - Thank you for coming in  Things we discussed today:  You need to stop smoking!  Call [facility] (1-800-QUIT-NOW) for ideas  Choose a quit date when you will stop completely.  Get prepared by slowly cutting down.  Find a substitute to use when you think you need a cigarette.  If you wish to discuss nicotine replacement (patches, gum) please make an appointment   Cholesterol Take the crestor every day Come back for a blood test in 3 months - will cholesterol and PSA then  Your goal blood pressure is less than 140/90.  Check your blood pressure several times a week.  If regularly higher than this please let me know - either with MyChart or leaving a phone message. Next visit please bring in your blood pressure cuff.     Hope the foot surgery goes well  Please always bring your medication bottles  Come back to see me in 6 months

## 2021-12-18 NOTE — Assessment & Plan Note (Signed)
Continue to work on stopping completely.  Plans to try when has foot surgery in November

## 2021-12-18 NOTE — Assessment & Plan Note (Signed)
Not at goal.  Start crestor Check labs in 3 months

## 2021-12-18 NOTE — Assessment & Plan Note (Addendum)
Not at goal.  Likely white coat hypertension. Monitor at home.  check labs when comes back for cholesterol check

## 2021-12-18 NOTE — Progress Notes (Signed)
    SUBJECTIVE:   CHIEF COMPLAINT / HPI:    Hypertension - does not check but  has cuff.  Takes his medications regularly and knows them.  No lightheadness or chest pain or new edema   Abdominal Pain - rarely has cramps and takes levsin. No weight loss or nausea and vomiting.     Smoking - currently smoking about 1 cigar per day.  Has stopped and started on and off.  Feels he needs to find a substitute.      Pre diabetes Weight - is exercising regularly and has cut out sweet drinks   Cholesterol Never started crestor.  Is willing to.   PERTINENT  PMH / PSH: married in spring 2023  OBJECTIVE:   BP (!) 140/97   Pulse 80   Wt (!) 352 lb 9.6 oz (159.9 kg)   SpO2 100%   BMI 38.74 kg/m   Heart - Regular rate and rhythm.  No murmurs, gallops or rubs.    Lungs:  Normal respiratory effort, chest expands symmetrically. Lungs are clear to auscultation, no crackles or wheezes. Extremities:  No cyanosis, edema, or deformity noted with good range of motion of all major joints.     ASSESSMENT/PLAN:   Hyperlipidemia Not at goal.  Start crestor Check labs in 3 months   Hypertension Not at goal.  Likely white coat hypertension. Monitor at home.  check labs when comes back for cholesterol check   Tobacco abuse Continue to work on stopping completely.  Plans to try when has foot surgery in November   Annual Examination Male over 47 yo  I reviewed the following patient responses on our Physical Exam Form Tobacco use and candidacy for lung cancer or aneurysm screening Alcohol Use  Weight  Exercise  Risk for STI  Fall risk Advanced Directive Increased family cancer risk Violence risk  PHQ9 score reviewed  Blood pressure reviewed  I considered the following items based upon USPSTF recommendations: Colon cancer screening Prostate Cancer if male at birth HIV testing:  Hepatitis C testing Cholesterol screening STI screening if high risk (Hepatitis B, Syphilis, Gonorrhea,  Chlamydia) Immunizations - Influenza, Covid, [place], Pneumonia, Tetanus  See After Visit Summary for recommendations  Patient Instructions  Good to see you today - Thank you for coming in  Things we discussed today:  You need to stop smoking!  Call [facility] (1-800-QUIT-NOW) for ideas  Choose a quit date when you will stop completely.  Get prepared by slowly cutting down.  Find a substitute to use when you think you need a cigarette.  If you wish to discuss nicotine replacement (patches, gum) please make an appointment   Cholesterol Take the crestor every day Come back for a blood test in 3 months - will cholesterol and PSA then  Your goal blood pressure is less than 140/90.  Check your blood pressure several times a week.  If regularly higher than this please let me know - either with MyChart or leaving a phone message. Next visit please bring in your blood pressure cuff.     Hope the foot surgery goes well  Please always bring your medication bottles  Come back to see me in 6 months    Lind Covert, MD [place]

## 2021-12-19 ENCOUNTER — Telehealth: Payer: Self-pay | Admitting: Urology

## 2021-12-19 NOTE — Telephone Encounter (Signed)
DOS - 01/14/22  DOUBLE OSTEOTOMY RIGHT --- 28299  [facility] EFFECTIVE DATE - 04/28/21  PLAN DEDUCTIBLE - $350.00 W/ $0.00 REMAINING OUT OF POCKET - $7900.00 W/ $7,390.00 REMAINING COINSURANCE - 40% COPAY - $0.00   SPOKE WITH SHANICE WITH [facility] AND SHE STATED THAT FOR CPT CODE 14276 NO PRIOR AUTH IS REQUIRED.  REF # [id number] - [id number]

## 2021-12-25 ENCOUNTER — Telehealth: Payer: Self-pay | Admitting: *Deleted

## 2021-12-25 NOTE — Telephone Encounter (Signed)
Patient is calling for status of a special sz cam boot(x large is too small) that was supposed to be ordered before his upcoming surgery on 09/19.

## 2022-01-01 ENCOUNTER — Other Ambulatory Visit (HOSPITAL_COMMUNITY): Payer: Self-pay

## 2022-01-09 ENCOUNTER — Other Ambulatory Visit (HOSPITAL_COMMUNITY): Payer: Self-pay

## 2022-01-13 ENCOUNTER — Other Ambulatory Visit (HOSPITAL_COMMUNITY): Payer: Self-pay

## 2022-01-13 ENCOUNTER — Encounter: Payer: 59 | Admitting: Podiatry

## 2022-01-13 MED ORDER — ONDANSETRON HCL 4 MG PO TABS
4.0000 mg | ORAL_TABLET | Freq: Three times a day (TID) | ORAL | 0 refills | Status: DC | PRN
  Filled 2022-01-13: qty 15, 5d supply, fill #0

## 2022-01-13 MED ORDER — OXYCODONE-ACETAMINOPHEN 10-325 MG PO TABS
1.0000 | ORAL_TABLET | ORAL | 0 refills | Status: DC | PRN
  Filled 2022-01-13: qty 20, 4d supply, fill #0

## 2022-01-13 NOTE — Addendum Note (Signed)
Addended by: Wallene Huh on: 01/13/2022 02:22 PM   Modules accepted: Orders

## 2022-01-14 ENCOUNTER — Encounter: Payer: Self-pay | Admitting: Podiatry

## 2022-01-14 DIAGNOSIS — M2011 Hallux valgus (acquired), right foot: Secondary | ICD-10-CM | POA: Diagnosis not present

## 2022-01-14 DIAGNOSIS — M2041 Other hammer toe(s) (acquired), right foot: Secondary | ICD-10-CM | POA: Diagnosis not present

## 2022-01-20 ENCOUNTER — Ambulatory Visit (INDEPENDENT_AMBULATORY_CARE_PROVIDER_SITE_OTHER): Payer: 59 | Admitting: Podiatry

## 2022-01-20 ENCOUNTER — Encounter: Payer: Self-pay | Admitting: Podiatry

## 2022-01-20 ENCOUNTER — Ambulatory Visit (INDEPENDENT_AMBULATORY_CARE_PROVIDER_SITE_OTHER): Payer: 59

## 2022-01-20 DIAGNOSIS — Z9889 Other specified postprocedural states: Secondary | ICD-10-CM | POA: Diagnosis not present

## 2022-01-20 DIAGNOSIS — M2041 Other hammer toe(s) (acquired), right foot: Secondary | ICD-10-CM

## 2022-01-20 NOTE — Progress Notes (Signed)
Subjective:   Patient ID: William Franco, male   DOB: 46 y.o.   MRN: [id number]   HPI Patient presents stating doing well with surgery very pleased so far and walking without pain   ROS      Objective:  Physical Exam  Neurovascular status intact negative Bevelyn Buckles' sign noted right foot healing well wound edges well coapted hallux in rectus position     Assessment:  Doing well post double osteotomy right first metatarsal hallux     Plan:  H&P x-rays reviewed and went ahead today advised on continued elevation compression immobilization and reappoint in 3 weeks or earlier if needed  X-rays indicate osteotomies are healing well fixation in place toe in good alignment

## 2022-01-28 ENCOUNTER — Encounter: Payer: Self-pay | Admitting: Podiatry

## 2022-01-29 ENCOUNTER — Encounter: Payer: Self-pay | Admitting: Podiatry

## 2022-02-12 ENCOUNTER — Ambulatory Visit (INDEPENDENT_AMBULATORY_CARE_PROVIDER_SITE_OTHER): Payer: 59

## 2022-02-12 ENCOUNTER — Ambulatory Visit (INDEPENDENT_AMBULATORY_CARE_PROVIDER_SITE_OTHER): Payer: 59 | Admitting: Podiatry

## 2022-02-12 ENCOUNTER — Encounter: Payer: Self-pay | Admitting: Podiatry

## 2022-02-12 DIAGNOSIS — Z9889 Other specified postprocedural states: Secondary | ICD-10-CM | POA: Diagnosis not present

## 2022-02-13 NOTE — Progress Notes (Signed)
Subjective:   Patient ID: William Franco, male   DOB: 46 y.o.   MRN: [id number]   HPI Patient states doing well very pleased with surgery   ROS      Objective:  Physical Exam  Neurovascular status intact negative Bevelyn Buckles' sign noted right foot healing well wound edges well coapted hallux in rectus position     Assessment:  Doing well post forefoot surgery right     Plan:  Reviewed condition and x-ray and patient can slowly return to soft shoe gear and can return to work over the next few weeks reappoint to check in the next 6 weeks  X-rays indicate osteotomies are healing well alignment looks good

## 2022-02-24 ENCOUNTER — Encounter: Payer: Self-pay | Admitting: Podiatry

## 2022-03-24 ENCOUNTER — Other Ambulatory Visit (HOSPITAL_COMMUNITY): Payer: Self-pay

## 2022-03-26 ENCOUNTER — Ambulatory Visit: Payer: 59 | Admitting: Podiatry

## 2022-04-02 ENCOUNTER — Other Ambulatory Visit: Payer: Self-pay

## 2022-04-02 ENCOUNTER — Ambulatory Visit (INDEPENDENT_AMBULATORY_CARE_PROVIDER_SITE_OTHER): Payer: 59

## 2022-04-02 ENCOUNTER — Encounter: Payer: Self-pay | Admitting: Podiatry

## 2022-04-02 ENCOUNTER — Other Ambulatory Visit (HOSPITAL_COMMUNITY): Payer: Self-pay

## 2022-04-02 ENCOUNTER — Ambulatory Visit: Payer: 59 | Admitting: Podiatry

## 2022-04-02 DIAGNOSIS — Z9889 Other specified postprocedural states: Secondary | ICD-10-CM

## 2022-04-02 MED ORDER — DICLOFENAC SODIUM 75 MG PO TBEC
75.0000 mg | DELAYED_RELEASE_TABLET | Freq: Two times a day (BID) | ORAL | 2 refills | Status: DC
  Filled 2022-04-02 – 2022-04-25 (×3): qty 50, 25d supply, fill #0

## 2022-04-02 NOTE — Progress Notes (Signed)
Subjective:   Patient ID: William Franco, male   DOB: 46 y.o.   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

## 2022-04-18 ENCOUNTER — Other Ambulatory Visit (HOSPITAL_COMMUNITY): Payer: Self-pay

## 2022-04-25 ENCOUNTER — Other Ambulatory Visit: Payer: Self-pay

## 2022-04-25 ENCOUNTER — Other Ambulatory Visit (HOSPITAL_COMMUNITY): Payer: Self-pay

## 2022-05-08 ENCOUNTER — Other Ambulatory Visit (HOSPITAL_COMMUNITY): Payer: Self-pay

## 2022-05-11 ENCOUNTER — Other Ambulatory Visit (HOSPITAL_COMMUNITY): Payer: Self-pay

## 2022-05-29 ENCOUNTER — Other Ambulatory Visit (HOSPITAL_COMMUNITY): Payer: Self-pay

## 2022-06-06 IMAGING — CT CT ABD-PELV W/ CM
2 of 6 series · 17 of 46 positions shown, 19 images · IV contrast (OMNIPAQUE 300)
Comparison: 05/16/2014.

CLINICAL DATA: Right lower quadrant pain, right flank pain, left
lower quadrant pain, constipation, elevated LFTs.

EXAM:
CT ABDOMEN AND PELVIS WITH CONTRAST
TECHNIQUE: Multidetector CT imaging of the abdomen and pelvis was performed
using the standard protocol following bolus administration of
intravenous contrast.
CONTRAST:  100mL OMNIPAQUE IOHEXOL 300 MG/ML  SOLN

[Series 2: abd/pel w · axial · 0.83mm/px · z∈[-568,-142]mm · 14 of 95 slices shown, 16 images]
[im 5/95  soft-tissue]
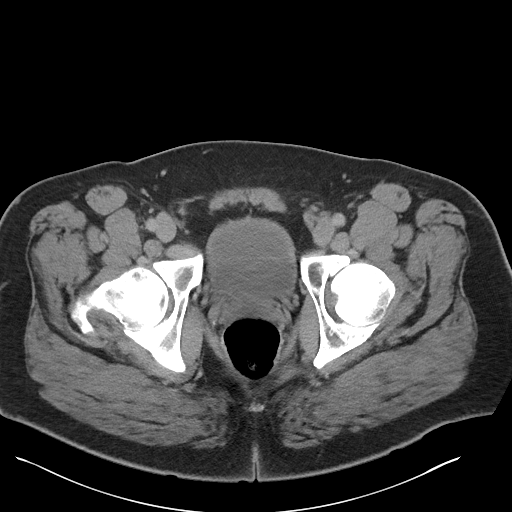
[im 5/95  bone]
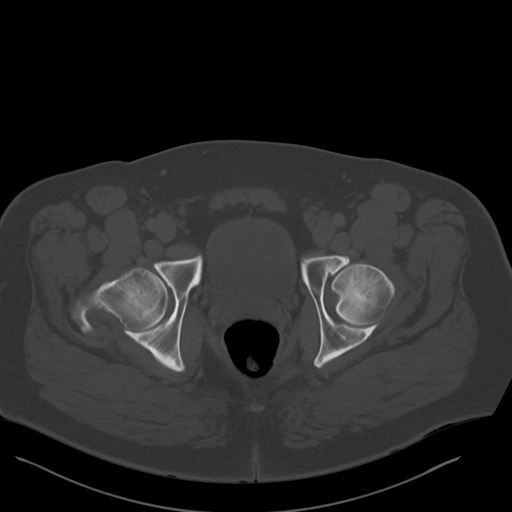
[im 15/95  soft-tissue]
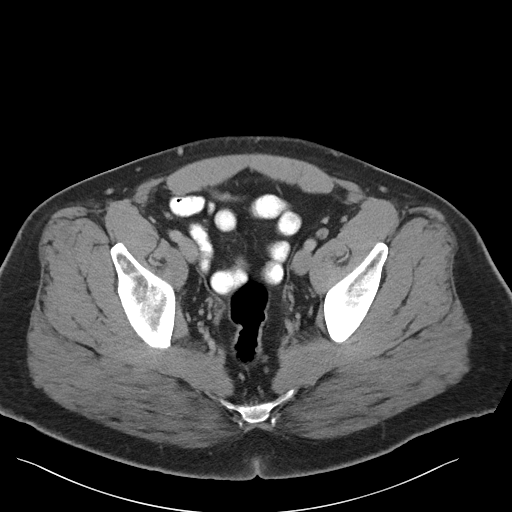
[im 19/95  soft-tissue]
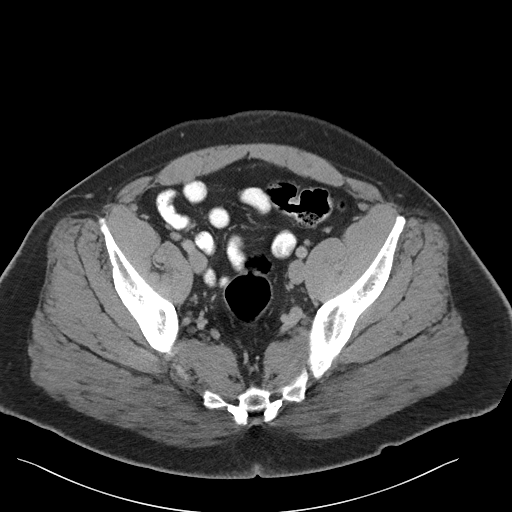
[im 24/95  soft-tissue]
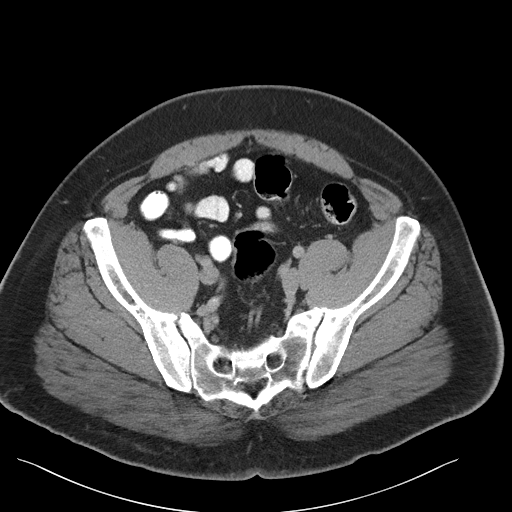
[im 33/95  soft-tissue]
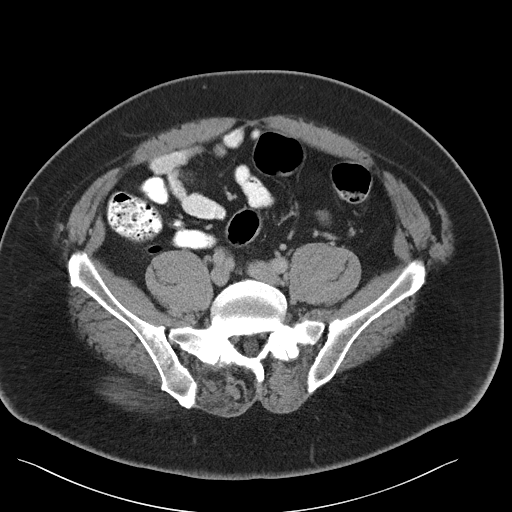
[im 38/95  soft-tissue]
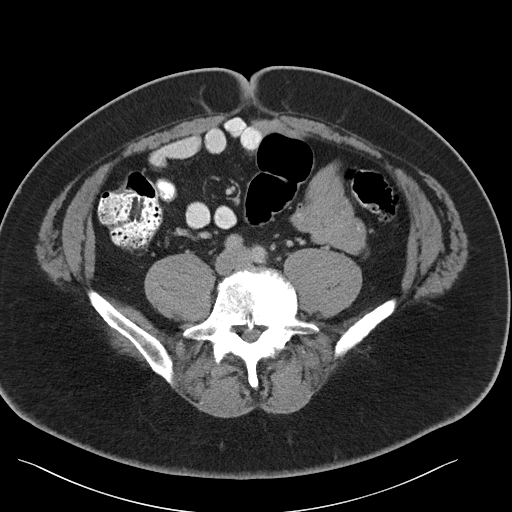
[im 43/95  soft-tissue]
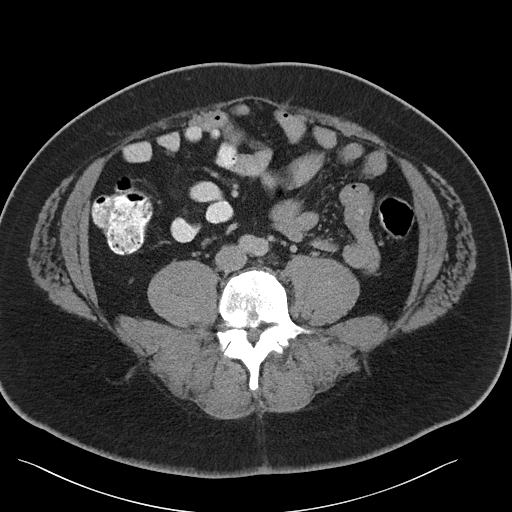
[im 52/95  soft-tissue]
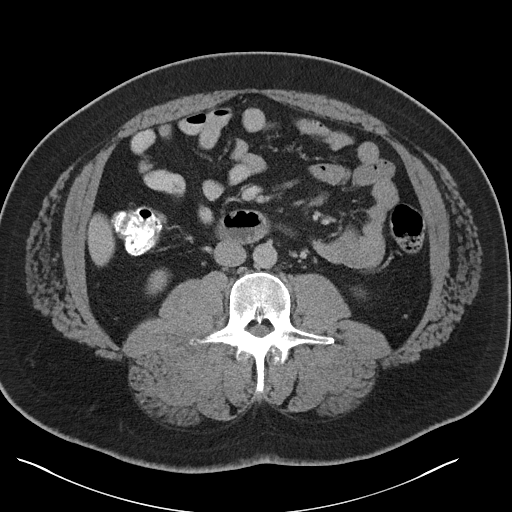
[im 57/95  soft-tissue]
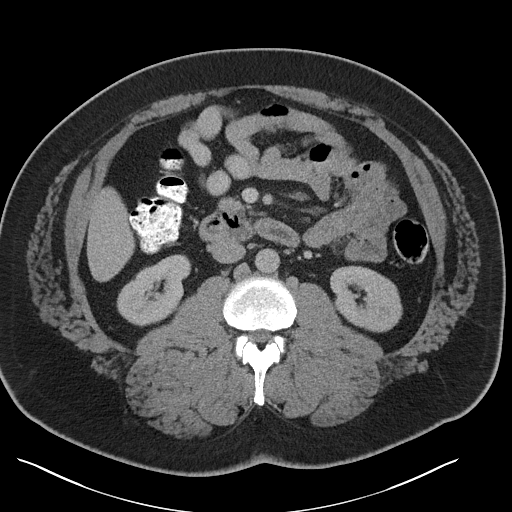
[im 57/95  bone]
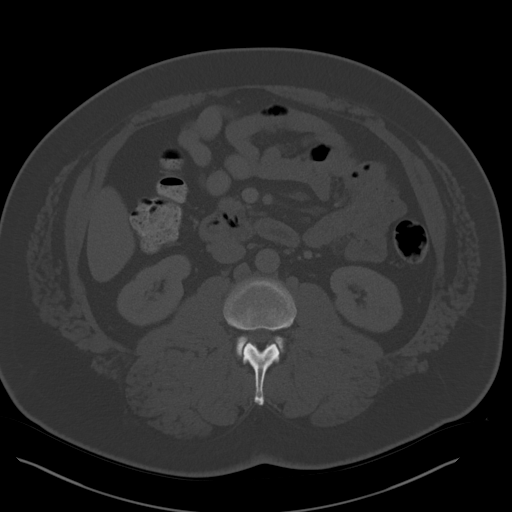
[im 62/95  soft-tissue]
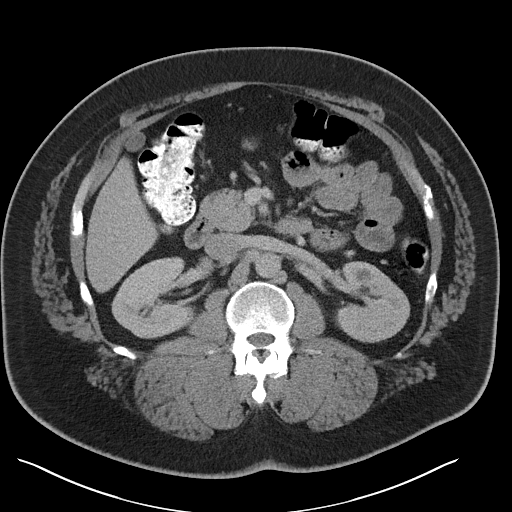
[im 71/95  soft-tissue]
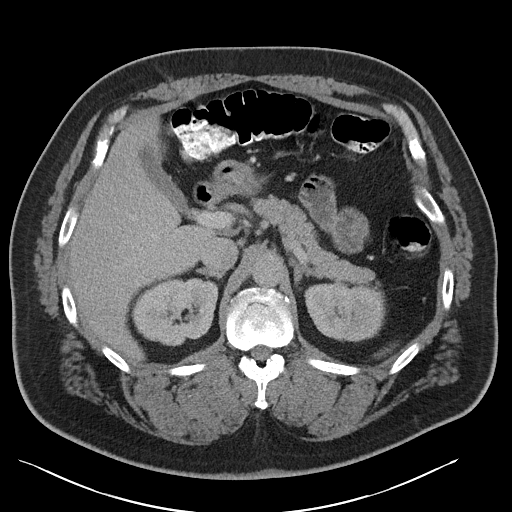
[im 76/95  soft-tissue]
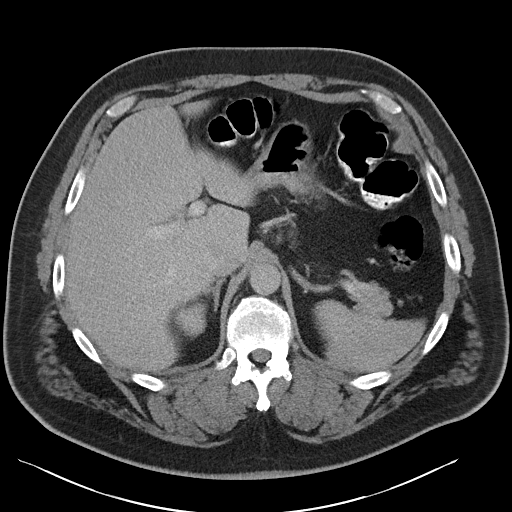
[im 80/95  soft-tissue]
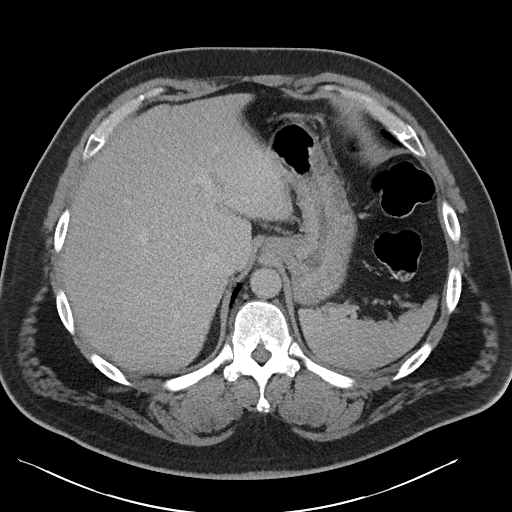
[im 90/95  soft-tissue]
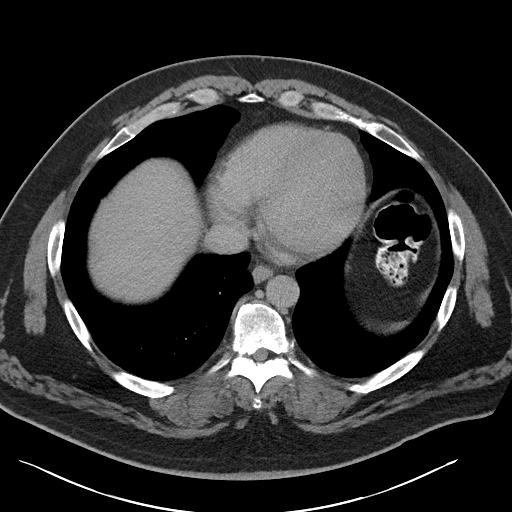

[Series 602: cor · coronal · 0.92mm/px · 3 of 163 slices shown]
[im 33/163  soft-tissue]
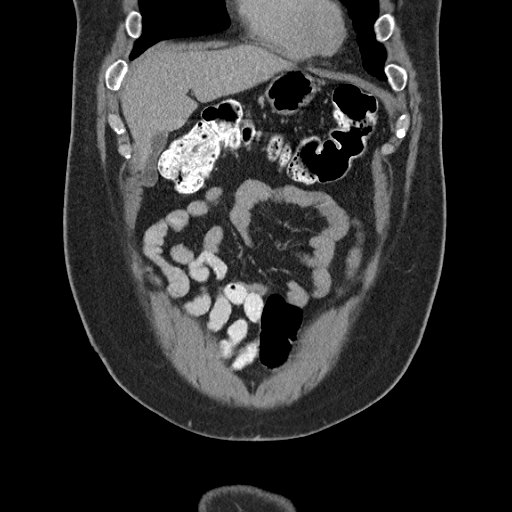
[im 65/163  soft-tissue]
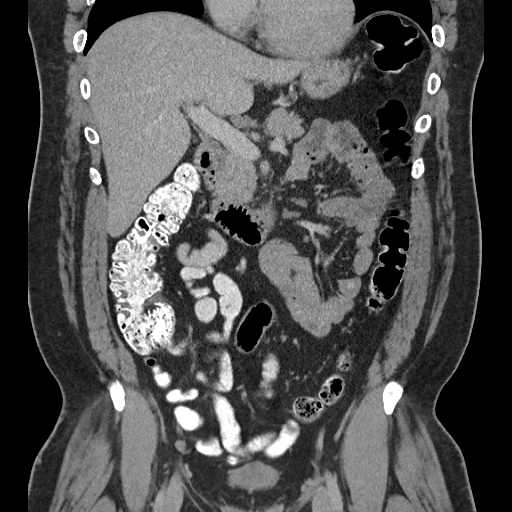
[im 98/163  soft-tissue]
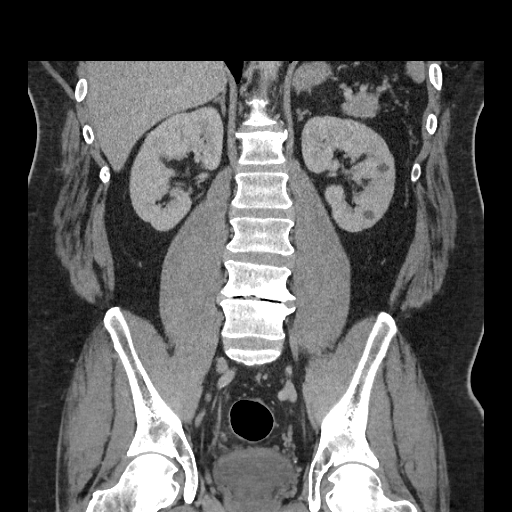

[17 of 46 positions shown; findings below may reference images not displayed]

FINDINGS: Lower chest: No acute abnormality.

Hepatobiliary: No focal liver abnormality is seen. No gallstones,
gallbladder wall thickening, or biliary dilatation.

Pancreas: Unremarkable. No pancreatic ductal dilatation or
surrounding inflammatory changes.

Spleen: Normal in size without focal abnormality.

Adrenals/Urinary Tract: Normal appearance of the adrenal glands.
Small bilateral low-attenuation foci within the kidneys are again
noted. These are technically too small to reliably characterize
measuring up to 9 mm. No kidney stone identified bilaterally. No
hydronephrosis or hydroureter. No ureteral lithiasis or bladder
calculi identified.

Stomach/Bowel: The stomach appears normal. No dilated loops of small
or large bowel identified. The appendix is visualized and appears
normal. No bowel wall thickening or inflammation identified.

Vascular/Lymphatic: No significant vascular findings are present. No
enlarged abdominal or pelvic lymph nodes.

Reproductive: Prostate is unremarkable.

Other: No free fluid or fluid collections

Musculoskeletal: No acute or suspicious osseous findings.
Degenerative disc disease noted at L4-5.
IMPRESSION: 1. No acute findings within the abdomen or pelvis. No explanation
for patient's right lower quadrant pain. The appendix is visualized
and appears normal.

## 2022-06-09 ENCOUNTER — Ambulatory Visit: Payer: BC Managed Care – PPO | Admitting: Podiatry

## 2022-06-11 ENCOUNTER — Encounter: Payer: Commercial Managed Care - PPO | Admitting: Family Medicine

## 2022-06-11 NOTE — Progress Notes (Deleted)
    SUBJECTIVE:   CHIEF COMPLAINT / HPI:    Hypertension - does not check but  has cuff.  Takes his medications regularly and knows them.  No lightheadness or chest pain or new edema   Abdominal Pain - rarely has cramps and takes levsin. No weight loss or nausea and vomiting.     Smoking - currently smoking about 1 cigar per day.  Has stopped and started on and off.  Feels he needs to find a substitute.      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   Allergy 10/19/2018   Gout 08/06/2016   Erectile dysfunction 04/19/2012   Hypertension 12/18/2010   Recurrent genital herpes 12/18/2010   Tobacco abuse 12/18/2010   Obesity 12/18/2010    Current Outpatient Medications  Medication Instructions   albuterol (VENTOLIN HFA) 108 (90 Base) MCG/ACT inhaler Inhale 2 puffs into the lungs every 6 (six) hours as needed for wheezing   amLODipine (NORVASC) 10 mg, Oral, Daily   diclofenac (VOLTAREN) 75 mg, Oral, 2 times daily   fluticasone (FLONASE) 50 MCG/ACT nasal spray 1 spray, Each Nare, Daily   hydrochlorothiazide (HYDRODIURIL) 25 mg, Oral, Daily   ibuprofen (ADVIL) 600 mg, Oral, Every 8 hours PRN   losartan (COZAAR) 100 mg, Oral, Daily   Multiple Vitamin (MULTIVITAMIN WITH MINERALS) TABS tablet 1 tablet, Oral, Daily   ondansetron (ZOFRAN) 4 mg, Oral, Every 8 hours PRN   OSCIMIN 0.125 MG tablet TAKE 1 TABLET (0.125 MG TOTAL) BY MOUTH EVERY 4 (FOUR) HOURS AS NEEDED.   oxyCODONE-acetaminophen (PERCOCET) 10-325 MG tablet 1 tablet, Oral, Every 4 hours PRN   rosuvastatin (CRESTOR) 10 mg, Oral, Daily   sildenafil (VIAGRA) 100 mg, Oral, As needed   valACYclovir (VALTREX) 500 MG tablet Take 1 tablet (500 mg total) by  mouth daily as needed       12/18/2021    9:03 AM 12/18/2021    8:43 AM 10/02/2021    2:10 PM  Vitals with BMI  Weight  352 lbs 10 oz   Systolic [phone]  Diastolic 97 93 96  Pulse  80 80      OBJECTIVE:   There were no vitals taken for this visit.  ***  ASSESSMENT/PLAN:   There are no diagnoses linked to this encounter.   There are no Patient Instructions on file for this visit.   Lind Covert, MD [place]

## 2022-06-19 ENCOUNTER — Ambulatory Visit: Payer: BC Managed Care – PPO | Admitting: Podiatry

## 2022-06-19 ENCOUNTER — Encounter: Payer: Self-pay | Admitting: Podiatry

## 2022-06-19 DIAGNOSIS — M21619 Bunion of unspecified foot: Secondary | ICD-10-CM

## 2022-06-19 NOTE — Progress Notes (Signed)
Subjective:   Patient ID: William Franco, male   DOB: 47 y.o.   MRN: [id number]   HPI Patient states he is getting a corn on his right fifth toe and his second toe at times hurts.  States where the bunion was done is doing fine with no pain but the fifth toe is bothering him   ROS      Objective:  Physical Exam  Neurovascular status intact negative Bevelyn Buckles' sign noted with a very large individual large foot with moderate obesity.  He has a keratotic lesion digit 5 right lateral side painful has some reaction of the right second digit good correction of the first MPJ good range of motion     Assessment:  Large framed individual puts a lot of stress on his foot with lesion formation right and distal second digit irritation     Plan:  H&P reviewed both conditions and x-ray and today I debrided lesion I did discuss that there is still movement and healing around the first metatarsal and even though there is no clinical symptoms I want to keep an eye on this and that ultimately may require revisional surgery but I am hopeful we will not especially since there is no clinical symptoms.  Recheck again in 10 weeks or earlier if any issues were to occur

## 2022-07-14 ENCOUNTER — Other Ambulatory Visit (HOSPITAL_COMMUNITY): Payer: Self-pay

## 2022-09-11 ENCOUNTER — Other Ambulatory Visit (HOSPITAL_COMMUNITY): Payer: Self-pay

## 2022-10-20 NOTE — Patient Instructions (Incomplete)
Good to see you today - Thank you for coming in  Things we discussed today:  You need a colonoscopy to prevent colon cancer.  I have placed a referral to the gastroenterologist's office.  They should call you within two weeks.  If they do not please let me know  {Select insurance to display therapy resources:[id number]}   Please always bring your medication bottles  Come back to see me in ***

## 2022-10-20 NOTE — Progress Notes (Unsigned)
    SUBJECTIVE:   CHIEF COMPLAINT / HPI:  Hypertension - does not check but  has cuff.  Takes his medications regularly and knows them.  No lightheadness or chest pain or new edema   Abdominal Pain - rarely has cramps and takes levsin. No weight loss or nausea and vomiting.     Smoking - currently smoking about 1 cigar per day.  Has stopped and started on and off.  Feels he needs to find a substitute.      Pre diabetes Weight - is exercising regularly and has cut out sweet drinks   Health Maintenance Due  Topic Date Due   COVID-19 Vaccine (1) Never done   DTaP/Tdap/Td (1 - Tdap) Never done    ***  PERTINENT  PMH / PSH: ***   OBJECTIVE:   There were no vitals taken for this visit.  ***  ASSESSMENT/PLAN:   No problem-specific Assessment & Plan notes found for this encounter.     Carney Living, MD [facility]

## 2022-10-21 ENCOUNTER — Other Ambulatory Visit (HOSPITAL_COMMUNITY): Payer: Self-pay

## 2022-10-21 ENCOUNTER — Encounter: Payer: Self-pay | Admitting: Family Medicine

## 2022-10-21 ENCOUNTER — Ambulatory Visit (INDEPENDENT_AMBULATORY_CARE_PROVIDER_SITE_OTHER): Payer: Self-pay | Admitting: Family Medicine

## 2022-10-21 ENCOUNTER — Other Ambulatory Visit: Payer: Self-pay

## 2022-10-21 VITALS — BP 145/87 | HR 78 | Ht >= 80 in | Wt 349.2 lb

## 2022-10-21 DIAGNOSIS — R7303 Prediabetes: Secondary | ICD-10-CM

## 2022-10-21 DIAGNOSIS — I10 Essential (primary) hypertension: Secondary | ICD-10-CM

## 2022-10-21 DIAGNOSIS — E785 Hyperlipidemia, unspecified: Secondary | ICD-10-CM

## 2022-10-21 DIAGNOSIS — Z125 Encounter for screening for malignant neoplasm of prostate: Secondary | ICD-10-CM

## 2022-10-21 LAB — POCT GLYCOSYLATED HEMOGLOBIN (HGB A1C): HbA1c, POC (controlled diabetic range): 6.5 % (ref 0.0–7.0)

## 2022-10-21 MED ORDER — AMLODIPINE BESYLATE 10 MG PO TABS
10.0000 mg | ORAL_TABLET | Freq: Every day | ORAL | 1 refills | Status: DC
Start: 2022-10-21 — End: 2023-07-30
  Filled 2022-10-21: qty 30, 30d supply, fill #0
  Filled 2022-12-16: qty 30, 30d supply, fill #1
  Filled 2023-01-27: qty 30, 30d supply, fill #2
  Filled 2023-03-09: qty 30, 30d supply, fill #3
  Filled 2023-04-24: qty 30, 30d supply, fill #4
  Filled 2023-06-08: qty 30, 30d supply, fill #5

## 2022-10-21 MED ORDER — ROSUVASTATIN CALCIUM 10 MG PO TABS
10.0000 mg | ORAL_TABLET | Freq: Every day | ORAL | 3 refills | Status: DC
  Filled 2022-10-21: qty 30, 30d supply, fill #0
  Filled 2022-12-16: qty 30, 30d supply, fill #1
  Filled 2023-03-09: qty 30, 30d supply, fill #2
  Filled 2023-04-24: qty 30, 30d supply, fill #3
  Filled 2023-06-08: qty 30, 30d supply, fill #4
  Filled 2023-07-30: qty 30, 30d supply, fill #5
  Filled 2023-09-17: qty 30, 30d supply, fill #6

## 2022-10-21 MED ORDER — LOSARTAN POTASSIUM 100 MG PO TABS
100.0000 mg | ORAL_TABLET | Freq: Every day | ORAL | 1 refills | Status: DC
Start: 2022-10-21 — End: 2023-07-30
  Filled 2022-10-21: qty 30, 30d supply, fill #0
  Filled 2022-12-16: qty 30, 30d supply, fill #1
  Filled 2023-01-27: qty 30, 30d supply, fill #2
  Filled 2023-03-09: qty 30, 30d supply, fill #3
  Filled 2023-04-24: qty 30, 30d supply, fill #4
  Filled 2023-06-08: qty 30, 30d supply, fill #5

## 2022-10-21 MED ORDER — HYDROCHLOROTHIAZIDE 25 MG PO TABS
25.0000 mg | ORAL_TABLET | Freq: Every day | ORAL | 1 refills | Status: DC
  Filled 2022-10-21: qty 30, 30d supply, fill #0
  Filled 2022-12-16: qty 30, 30d supply, fill #1
  Filled 2023-01-27: qty 30, 30d supply, fill #2
  Filled 2023-03-09: qty 30, 30d supply, fill #3
  Filled 2023-04-24: qty 30, 30d supply, fill #4
  Filled 2023-06-08: qty 30, 30d supply, fill #5

## 2022-10-21 MED ORDER — METFORMIN HCL ER 500 MG PO TB24
500.0000 mg | ORAL_TABLET | Freq: Every day | ORAL | 1 refills | Status: DC
  Filled 2022-10-21: qty 30, 30d supply, fill #0
  Filled 2023-07-30: qty 30, 30d supply, fill #1

## 2022-10-21 NOTE — Assessment & Plan Note (Addendum)
Not at goal.  Recommend checking closely at home and life style changes follow up in 3 months

## 2022-10-21 NOTE — Assessment & Plan Note (Signed)
Worsened.  Recommend increased behavior change, resume metformin and check back in 3 months

## 2022-10-22 LAB — BASIC METABOLIC PANEL WITH GFR
BUN/Creatinine Ratio: 16 (ref 9–20)
BUN: 13 mg/dL (ref 6–24)
CO2: 21 mmol/L (ref 20–29)
Calcium: 9.3 mg/dL (ref 8.7–10.2)
Chloride: 104 mmol/L (ref 96–106)
Creatinine, Ser: 0.79 mg/dL (ref 0.76–1.27)
Glucose: 153 mg/dL — ABNORMAL HIGH (ref 70–99)
Potassium: 3.8 mmol/L (ref 3.5–5.2)
Sodium: 142 mmol/L (ref 134–144)
eGFR: 110 mL/min/1.73

## 2022-10-22 LAB — PSA: Prostate Specific Ag, Serum: 0.5 ng/mL (ref 0.0–4.0)

## 2022-10-28 ENCOUNTER — Other Ambulatory Visit (HOSPITAL_COMMUNITY): Payer: Self-pay

## 2023-01-27 ENCOUNTER — Other Ambulatory Visit (HOSPITAL_COMMUNITY): Payer: Self-pay

## 2023-03-09 ENCOUNTER — Other Ambulatory Visit (HOSPITAL_COMMUNITY): Payer: Self-pay

## 2023-04-24 ENCOUNTER — Other Ambulatory Visit (HOSPITAL_COMMUNITY): Payer: Self-pay

## 2023-05-18 ENCOUNTER — Encounter: Payer: Self-pay | Admitting: Family Medicine

## 2023-06-01 ENCOUNTER — Other Ambulatory Visit: Payer: Self-pay | Admitting: *Deleted

## 2023-06-01 DIAGNOSIS — I8393 Asymptomatic varicose veins of bilateral lower extremities: Secondary | ICD-10-CM

## 2023-06-02 ENCOUNTER — Ambulatory Visit (HOSPITAL_COMMUNITY)
Admission: RE | Admit: 2023-06-02 | Discharge: 2023-06-02 | Disposition: A | Discharge status: Home / Self Care | Payer: BC Managed Care – PPO | Source: Ambulatory Visit | Attending: Vascular Surgery | Admitting: Vascular Surgery

## 2023-06-02 DIAGNOSIS — I8393 Asymptomatic varicose veins of bilateral lower extremities: Secondary | ICD-10-CM | POA: Diagnosis present

## 2023-06-04 ENCOUNTER — Encounter: Payer: Self-pay | Admitting: Family Medicine

## 2023-06-04 ENCOUNTER — Other Ambulatory Visit (HOSPITAL_COMMUNITY): Payer: Self-pay

## 2023-06-04 ENCOUNTER — Ambulatory Visit (INDEPENDENT_AMBULATORY_CARE_PROVIDER_SITE_OTHER): Payer: Self-pay | Admitting: Family Medicine

## 2023-06-04 VITALS — BP 142/97 | HR 73 | Ht >= 80 in | Wt 350.0 lb

## 2023-06-04 DIAGNOSIS — I10 Essential (primary) hypertension: Secondary | ICD-10-CM

## 2023-06-04 DIAGNOSIS — E785 Hyperlipidemia, unspecified: Secondary | ICD-10-CM

## 2023-06-04 DIAGNOSIS — J302 Other seasonal allergic rhinitis: Secondary | ICD-10-CM

## 2023-06-04 DIAGNOSIS — Z0000 Encounter for general adult medical examination without abnormal findings: Secondary | ICD-10-CM

## 2023-06-04 DIAGNOSIS — R7303 Prediabetes: Secondary | ICD-10-CM

## 2023-06-04 DIAGNOSIS — Z125 Encounter for screening for malignant neoplasm of prostate: Secondary | ICD-10-CM

## 2023-06-04 DIAGNOSIS — N529 Male erectile dysfunction, unspecified: Secondary | ICD-10-CM

## 2023-06-04 DIAGNOSIS — A6000 Herpesviral infection of urogenital system, unspecified: Secondary | ICD-10-CM

## 2023-06-04 DIAGNOSIS — Z23 Encounter for immunization: Secondary | ICD-10-CM

## 2023-06-04 LAB — POCT GLYCOSYLATED HEMOGLOBIN (HGB A1C): Hemoglobin A1C: 6.3 % — AB (ref 4.0–5.6)

## 2023-06-04 MED ORDER — FLUTICASONE PROPIONATE 50 MCG/ACT NA SUSP
1.0000 | Freq: Every day | NASAL | 3 refills | Status: AC | PRN
  Filled 2023-06-04: qty 16, 30d supply, fill #0
  Filled 2023-11-04: qty 16, 30d supply, fill #1

## 2023-06-04 NOTE — Progress Notes (Signed)
 Patient ID: William Franco, male   DOB: [birth year], 48 y.o.   MRN: [id number]  Reason for Consult: No chief complaint on file.   Referred by Donah Laymon PARAS, MD  Subjective:     HPI  William Franco is a 48 y.o. male who presents for evaluation of left lower extremity aching and heaviness. He has had his left GSV treated by Dr. Melvenia in the past and recently over the last few weeks has noted some aching and heaviness in the left leg towards the end of the day as well as some thigh varicosities.  He does wear compression and elevates he also treats varicosities with ice therapy.  He reports he came in as a precaution just to ensure that the treated GSV was still closed.   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   Family History  Problem Relation Age of Onset   Hypertension Father    Hypertension Mother    Colon cancer Neg Hx    Colon polyps Neg Hx    Esophageal cancer Neg Hx    Rectal cancer Neg Hx    Stomach cancer Neg Hx    Past Surgical History:  Procedure Laterality Date   BACK SURGERY     ENDOVENOUS ABLATION SAPHENOUS VEIN W/ LASER Left 06/23/2019   endovenous laser ablation left greater saphenous vein and stab phlebectomy 10-20 incisions left leg by Medford Blade MD     Short Social History:  Social History   Tobacco Use   Smoking status: Every Day    Types: Cigars   Smokeless tobacco: Never  Substance Use Topics   Alcohol use: Yes    Comment: occasionally    Allergies  Allergen Reactions   Bee Venom Swelling    Current Outpatient Medications  Medication Sig Dispense Refill   amLODipine  (NORVASC ) 10 MG tablet Take 1 tablet (10 mg total) by mouth daily. 90 tablet 1   diclofenac  (VOLTAREN ) 75 MG EC tablet Take 1 tablet (75 mg total) by mouth 2 (two) times daily. (Patient not taking: Reported on 10/21/2022) 50 tablet 2    fluticasone  (FLONASE ) 50 MCG/ACT nasal spray Place 1 spray into both nostrils daily. (Patient not taking: Reported on 12/18/2021) 16 g 2   hydrochlorothiazide  (HYDRODIURIL ) 25 MG tablet Take 1 tablet (25 mg total) by mouth daily. 90 tablet 1   losartan  (COZAAR ) 100 MG tablet Take 1 tablet (100 mg total) by mouth daily. 90 tablet 1   metFORMIN  (GLUCOPHAGE -XR) 500 MG 24 hr tablet Take 1 tablet (500 mg total) by mouth daily with breakfast. 90 tablet 1   Multiple Vitamin (MULTIVITAMIN WITH MINERALS) TABS tablet Take 1 tablet by mouth daily.     OSCIMIN  0.125 MG tablet TAKE 1 TABLET (0.125 MG TOTAL) BY MOUTH EVERY 4 (FOUR) HOURS AS NEEDED. (Patient not taking: Reported on 10/21/2022) 30 tablet 1   rosuvastatin  (CRESTOR ) 10 MG tablet Take 1 tablet (10 mg total) by mouth daily. 90 tablet 3   sildenafil  (VIAGRA ) 100 MG tablet Take 1 tablet (100 mg total) by mouth as needed. 5 tablet 11   valACYclovir  (VALTREX ) 500 MG tablet Take 1 tablet (500 mg total) by mouth daily as needed 90 tablet 1   No current facility-administered medications for this visit.    REVIEW OF SYSTEMS   All other systems were reviewed and are negative  Objective:  Objective   There were no vitals filed for this visit. There is no height or weight on file to calculate BMI.  Physical Exam General: no acute distress Cardiac: hemodynamically stable Pulm: normal work of breathing Neuro: alert, no focal deficit Extremities: Small reticular veins throughout the left lower extremity, no large varicosities or swelling Vascular:   Right: palpable DP, PT  Left: palpable DP, PT   Data: Reflux study +--------------+--------+------+----------+------------+-------------------  ----+  LEFT         Reflux  Reflux  Reflux  Diameter cmsComments                                No       Yes     Time                                         +--------------+--------+------+----------+------------+-------------------   ----+  CFV                   yes  >1 second                                       +--------------+--------+------+----------+------------+-------------------  ----+  FV mid                 yes  >1 second                                       +--------------+--------+------+----------+------------+-------------------  ----+  Popliteal             yes  >1 second                                       +--------------+--------+------+----------+------------+-------------------  ----+  GSV at [facility]    no                          0.41                              +--------------+--------+------+----------+------------+-------------------  ----+  GSV prox thigh               >500 ms              prior                                                                       ablation/stripping        +--------------+--------+------+----------+------------+-------------------  ----+  GSV mid thigh                                     prior  ablation/stripping        +--------------+--------+------+----------+------------+-------------------  ----+  GSV dist thigh                                    prior                                                                       ablation/stripping        +--------------+--------+------+----------+------------+-------------------  ----+  GSV at knee   no                          0.41                              +--------------+--------+------+----------+------------+-------------------  ----+  GSV prox calf no                          0.27                              +--------------+--------+------+----------+------------+-------------------  ----+  GSV mid calf  no                          0.22                               +--------------+--------+------+----------+------------+-------------------  ----+  GSV dist calf no                          0.35                              +--------------+--------+------+----------+------------+-------------------  ----+  SSV Pop Fossa no                          0.30                              +--------------+--------+------+----------+------------+-------------------  ----+  SSV prox calf no                          0.27                              +--------------+--------+------+----------+------------+-------------------  ----+  SSV mid calf  no                          0.24                              +--------------+--------+------+----------+------------+-------------------       Assessment/Plan:     Ronalee JONETTA Schlossman is a 48 y.o. male with chronic venous insufficiency with  C2 disease and reflux noted in the common femoral, femoral and popliteal veins.  He has had prior ablation of the GSV in the thigh. I explained the foundation of CVI treatment of compression and elevation and also explained that these are the only treatments for deep venous reflux. I recommended to nearing medical grade graduated compression stockings and intermittent leg elevation Explained that he probably has some mixed symptoms of chronic venous insufficiency with the aching and heaviness as well as some arthritis as his hip and thigh pain is worse with temperature changes Follow-up as needed     Norman GORMAN Serve MD [facility]

## 2023-06-04 NOTE — Progress Notes (Signed)
  Date of Visit: 06/04/2023   SUBJECTIVE:   HPI:  William Franco presents today for a well adult male exam.   Smoking: cigars Alcohol: occasional Drugs: no Mood: no concerns Cancers in family: multiple family members with prostate cancer, mom also had ovarian cancer  Hypertension: Currently taking amlodipine  10 mg daily, HCTZ 25 mg daily, and losartan  100 mg daily. Does not consistently check blood pressure at home. Sometimes misses doses.  Hyperlipidemia: Currently taking rosuvastatin  10 mg daily.  Recurrent genital herpes: Currently taking Valtrex  500 mg daily as needed. Only gets flareups about once a year.   Erectile dysfunction: Currently taking sildenafil  100 mg as needed with good results.  Prediabetes: Taking metformin  XR 500 mg daily.  A1c today is 6.3.  OBJECTIVE:   BP (!) 142/97   Pulse 73   Ht 6' 8 (2.032 m)   Wt (!) 350 lb (158.8 kg)   SpO2 100%   BMI 38.45 kg/m  Gen: NAD, pleasant, cooperative HEENT: NCAT, no palpable thyromegaly or anterior cervical lymphadenopathy Heart: RRR, no murmurs Lungs: CTAB, NWOB Abdomen: soft, nontender to palpation Neuro: grossly nonfocal, speech normal  ASSESSMENT/PLAN:    Assessment & Plan Routine adult health maintenance -immunizations: Flu: UTD Tdap: booster given today COVID: declines Pneumovax: declines -lipid screening: on statin, check lipids today -colon cancer screening: UTD on colonoscopy -prostate cancer screening: reviewed risks/benefits of PSA testing. Given ethnicity and family history, recommend PSA testing, he is agreeable. PSA drawn today. -handout given on health maintenance topics Prediabetes A1c 6.3 today, continue metformin  Primary hypertension Blood pressure up but did not take medications in last 24h, he will return for BP recheck on a day he has taken them Recurrent genital herpes Stable on as needed valtrex , continue Erectile dysfunction, unspecified erectile dysfunction type Stable on as  needed viagra , continue Hyperlipidemia, unspecified hyperlipidemia type Check lipids today, continue statin Seasonal allergies Stable continue flonase , refilled today   Dawanna Grauberger J. Donah, MD [facility]

## 2023-06-04 NOTE — Patient Instructions (Signed)
 It was great to see you again today.  Come back 1 day to get your blood pressure checked.  You can schedule a nurse visit for this on the way out.  Checking labs today  Tetanus shot booster today  Sent in refill of Flonase  for you  Be well, Dr. Donah

## 2023-06-05 ENCOUNTER — Encounter: Payer: Self-pay | Admitting: Vascular Surgery

## 2023-06-05 ENCOUNTER — Ambulatory Visit: Payer: BC Managed Care – PPO | Admitting: Vascular Surgery

## 2023-06-05 VITALS — BP 122/82 | HR 68 | Temp 98.0°F | Resp 20 | Ht >= 80 in | Wt 350.8 lb

## 2023-06-05 DIAGNOSIS — I872 Venous insufficiency (chronic) (peripheral): Secondary | ICD-10-CM | POA: Diagnosis not present

## 2023-06-05 LAB — LIPID PANEL
Chol/HDL Ratio: 4.2 {ratio} (ref 0.0–5.0)
Cholesterol, Total: 168 mg/dL (ref 100–199)
HDL: 40 mg/dL (ref >39)
LDL Chol Calc (NIH): 104 mg/dL — ABNORMAL HIGH (ref 0–99)
Triglycerides: 133 mg/dL (ref 0–149)
VLDL Cholesterol Cal: 24 mg/dL (ref 5–40)

## 2023-06-05 LAB — CMP14+EGFR
ALT: 57 [IU]/L — ABNORMAL HIGH (ref 0–44)
AST: 34 [IU]/L (ref 0–40)
Albumin: 4.6 g/dL (ref 4.1–5.1)
Alkaline Phosphatase: 53 [IU]/L (ref 44–121)
BUN/Creatinine Ratio: 15 (ref 9–20)
BUN: 15 mg/dL (ref 6–24)
Bilirubin Total: 0.4 mg/dL (ref 0.0–1.2)
CO2: 23 mmol/L (ref 20–29)
Calcium: 9.3 mg/dL (ref 8.7–10.2)
Chloride: 100 mmol/L (ref 96–106)
Creatinine, Ser: 0.98 mg/dL (ref 0.76–1.27)
Globulin, Total: 3.1 g/dL (ref 1.5–4.5)
Glucose: 96 mg/dL (ref 70–99)
Potassium: 4 mmol/L (ref 3.5–5.2)
Sodium: 140 mmol/L (ref 134–144)
Total Protein: 7.7 g/dL (ref 6.0–8.5)
eGFR: 95 mL/min/{1.73_m2} (ref >59)

## 2023-06-05 LAB — PSA: Prostate Specific Ag, Serum: 0.5 ng/mL (ref 0.0–4.0)

## 2023-06-06 DIAGNOSIS — Z0000 Encounter for general adult medical examination without abnormal findings: Secondary | ICD-10-CM | POA: Insufficient documentation

## 2023-06-06 NOTE — Assessment & Plan Note (Signed)
-  immunizations: Flu: UTD Tdap: booster given today COVID: declines Pneumovax: declines -lipid screening: on statin, check lipids today -colon cancer screening: UTD on colonoscopy -prostate cancer screening: reviewed risks/benefits of PSA testing. Given ethnicity and family history, recommend PSA testing, he is agreeable. PSA drawn today. -handout given on health maintenance topics

## 2023-06-06 NOTE — Assessment & Plan Note (Signed)
Check lipids today, continue statin

## 2023-06-06 NOTE — Assessment & Plan Note (Signed)
 Stable on as needed viagra , continue

## 2023-06-06 NOTE — Assessment & Plan Note (Signed)
 Stable on as needed valtrex , continue

## 2023-06-06 NOTE — Assessment & Plan Note (Signed)
 Stable continue flonase , refilled today

## 2023-06-06 NOTE — Assessment & Plan Note (Signed)
 A1c 6.3 today, continue metformin 

## 2023-06-06 NOTE — Assessment & Plan Note (Signed)
 Blood pressure up but did not take medications in last 24h, he will return for BP recheck on a day he has taken them

## 2023-06-19 ENCOUNTER — Encounter: Payer: Self-pay | Admitting: Family Medicine

## 2023-07-02 ENCOUNTER — Ambulatory Visit: Payer: BC Managed Care – PPO | Admitting: Podiatry

## 2023-07-30 ENCOUNTER — Other Ambulatory Visit (HOSPITAL_COMMUNITY): Payer: Self-pay

## 2023-07-30 ENCOUNTER — Other Ambulatory Visit: Payer: Self-pay | Admitting: Family Medicine

## 2023-07-30 DIAGNOSIS — I10 Essential (primary) hypertension: Secondary | ICD-10-CM

## 2023-08-03 ENCOUNTER — Other Ambulatory Visit (HOSPITAL_COMMUNITY): Payer: Self-pay

## 2023-08-03 MED ORDER — HYDROCHLOROTHIAZIDE 25 MG PO TABS
25.0000 mg | ORAL_TABLET | Freq: Every day | ORAL | 3 refills | Status: AC
  Filled 2023-08-03: qty 30, 30d supply, fill #0
  Filled 2023-09-17: qty 30, 30d supply, fill #1
  Filled 2023-11-04: qty 30, 30d supply, fill #2
  Filled 2023-12-23: qty 30, 30d supply, fill #3
  Filled 2024-02-07: qty 30, 30d supply, fill #4
  Filled 2024-03-22: qty 30, 30d supply, fill #5
  Filled 2024-05-17: qty 30, 30d supply, fill #6

## 2023-08-03 MED ORDER — AMLODIPINE BESYLATE 10 MG PO TABS
10.0000 mg | ORAL_TABLET | Freq: Every day | ORAL | 3 refills | Status: AC
  Filled 2023-08-03: qty 30, 30d supply, fill #0
  Filled 2023-09-17: qty 30, 30d supply, fill #1
  Filled 2023-11-04: qty 30, 30d supply, fill #2
  Filled 2023-12-23: qty 30, 30d supply, fill #3
  Filled 2024-02-07: qty 30, 30d supply, fill #4
  Filled 2024-03-22: qty 30, 30d supply, fill #5
  Filled 2024-05-17: qty 30, 30d supply, fill #6

## 2023-08-03 MED ORDER — SILDENAFIL CITRATE 100 MG PO TABS
100.0000 mg | ORAL_TABLET | ORAL | 11 refills | Status: AC | PRN
  Filled 2023-08-03: qty 5, 30d supply, fill #0
  Filled 2024-03-22: qty 5, 30d supply, fill #1

## 2023-08-03 MED ORDER — LOSARTAN POTASSIUM 100 MG PO TABS
100.0000 mg | ORAL_TABLET | Freq: Every day | ORAL | 3 refills | Status: AC
  Filled 2023-08-03: qty 30, 30d supply, fill #0
  Filled 2023-09-17: qty 30, 30d supply, fill #1
  Filled 2023-11-04: qty 30, 30d supply, fill #2
  Filled 2023-12-23: qty 30, 30d supply, fill #3
  Filled 2024-02-07: qty 30, 30d supply, fill #4
  Filled 2024-03-22: qty 30, 30d supply, fill #5
  Filled 2024-05-17: qty 30, 30d supply, fill #6

## 2023-11-04 ENCOUNTER — Other Ambulatory Visit: Payer: Self-pay | Admitting: Family Medicine

## 2023-11-04 DIAGNOSIS — E785 Hyperlipidemia, unspecified: Secondary | ICD-10-CM

## 2023-11-04 DIAGNOSIS — R7303 Prediabetes: Secondary | ICD-10-CM

## 2023-11-05 ENCOUNTER — Other Ambulatory Visit: Payer: Self-pay

## 2023-11-09 ENCOUNTER — Other Ambulatory Visit (HOSPITAL_COMMUNITY): Payer: Self-pay

## 2023-11-09 MED ORDER — METFORMIN HCL ER 500 MG PO TB24
500.0000 mg | ORAL_TABLET | Freq: Every day | ORAL | 1 refills | Status: AC
  Filled 2023-11-09: qty 30, 30d supply, fill #0
  Filled 2024-05-17: qty 30, 30d supply, fill #1

## 2023-11-09 MED ORDER — ROSUVASTATIN CALCIUM 10 MG PO TABS
10.0000 mg | ORAL_TABLET | Freq: Every day | ORAL | 1 refills | Status: AC
  Filled 2023-11-09: qty 30, 30d supply, fill #0
  Filled 2024-02-07: qty 30, 30d supply, fill #1
  Filled 2024-03-22: qty 30, 30d supply, fill #2
  Filled 2024-05-17: qty 30, 30d supply, fill #3

## 2023-12-11 ENCOUNTER — Emergency Department (HOSPITAL_BASED_OUTPATIENT_CLINIC_OR_DEPARTMENT_OTHER): Admission: EM | Admit: 2023-12-11 | Discharge: 2023-12-11 | Disposition: A | Discharge status: Home / Self Care

## 2023-12-11 ENCOUNTER — Emergency Department (HOSPITAL_BASED_OUTPATIENT_CLINIC_OR_DEPARTMENT_OTHER)

## 2023-12-11 ENCOUNTER — Encounter (HOSPITAL_BASED_OUTPATIENT_CLINIC_OR_DEPARTMENT_OTHER): Payer: Self-pay | Admitting: Emergency Medicine

## 2023-12-11 ENCOUNTER — Other Ambulatory Visit: Payer: Self-pay

## 2023-12-11 DIAGNOSIS — M25521 Pain in right elbow: Secondary | ICD-10-CM | POA: Insufficient documentation

## 2023-12-11 DIAGNOSIS — Z79899 Other long term (current) drug therapy: Secondary | ICD-10-CM | POA: Insufficient documentation

## 2023-12-11 DIAGNOSIS — J45909 Unspecified asthma, uncomplicated: Secondary | ICD-10-CM | POA: Insufficient documentation

## 2023-12-11 DIAGNOSIS — M79601 Pain in right arm: Secondary | ICD-10-CM | POA: Diagnosis present

## 2023-12-11 DIAGNOSIS — I10 Essential (primary) hypertension: Secondary | ICD-10-CM | POA: Diagnosis not present

## 2023-12-11 DIAGNOSIS — F17210 Nicotine dependence, cigarettes, uncomplicated: Secondary | ICD-10-CM | POA: Insufficient documentation

## 2023-12-11 DIAGNOSIS — Z7951 Long term (current) use of inhaled steroids: Secondary | ICD-10-CM | POA: Diagnosis not present

## 2023-12-11 DIAGNOSIS — W109XXA Fall (on) (from) unspecified stairs and steps, initial encounter: Secondary | ICD-10-CM | POA: Diagnosis not present

## 2023-12-11 MED ORDER — CELECOXIB 200 MG PO CAPS
200.0000 mg | ORAL_CAPSULE | Freq: Two times a day (BID) | ORAL | 0 refills | Status: DC | PRN
  Filled 2023-12-11: qty 30, 15d supply, fill #0

## 2023-12-11 MED ORDER — HYDROCODONE-ACETAMINOPHEN 5-325 MG PO TABS
1.0000 | ORAL_TABLET | Freq: Once | ORAL | Status: AC
  Administered 2023-12-11: 1 via ORAL
  Filled 2023-12-11: qty 1

## 2023-12-11 NOTE — ED Provider Notes (Signed)
 [facility] Provider Note   CSN: [id number] Arrival date & time: 12/11/23  8147     Patient presents with: Arm Injury   William Franco is a 48 y.o. male.    Arm Injury   48 year old male presents emergency department with complaints of right arm pain.  States that he was helping move a washer machine on the stairs when it slipped.  States that he felt a popping sensation in his right forearm and he had pain specifically with turning his hand over.  Denies any other pain/injury/traumas.  Denies any weakness or sensory deficits in right hand.  Presents emergency department for further assessment.  Past medical history significant for lumbar spinal stenosis, asthma, nephrolithiasis, hypertension.  Prior to Admission medications   Medication Sig Start Date End Date Taking? Authorizing Provider  amLODipine  (NORVASC ) 10 MG tablet Take 1 tablet (10 mg total) by mouth daily. 08/03/23   Donah Laymon PARAS, MD  fluticasone  (FLONASE ) 50 MCG/ACT nasal spray Place 1 spray into both nostrils daily as needed for allergies. 06/04/23   Donah Laymon PARAS, MD  hydrochlorothiazide  (HYDRODIURIL ) 25 MG tablet Take 1 tablet (25 mg total) by mouth daily. 08/03/23   Donah Laymon PARAS, MD  losartan  (COZAAR ) 100 MG tablet Take 1 tablet (100 mg total) by mouth daily. 08/03/23   Donah Laymon PARAS, MD  metFORMIN  (GLUCOPHAGE -XR) 500 MG 24 hr tablet Take 1 tablet (500 mg total) by mouth daily with breakfast. 11/09/23   Donah Laymon PARAS, MD  Multiple Vitamin (MULTIVITAMIN WITH MINERALS) TABS tablet Take 1 tablet by mouth daily.    [provider]  rosuvastatin  (CRESTOR ) 10 MG tablet Take 1 tablet (10 mg total) by mouth daily. 11/09/23   Donah Laymon PARAS, MD  sildenafil  (VIAGRA ) 100 MG tablet Take 1 tablet (100 mg total) by mouth as needed. 08/03/23   Donah Laymon PARAS, MD  valACYclovir  (VALTREX ) 500 MG tablet Take 1 tablet (500 mg total) by mouth  daily as needed 04/02/21   Jeanelle Layman CROME, MD    Allergies: Bee venom    Review of Systems  All other systems reviewed and are negative.   Updated Vital Signs BP (!) 142/98 (BP Location: Left Arm)   Pulse (!) 111   Temp 98.1 F (36.7 C) (Oral)   Resp 18   Ht 6' 8 (2.032 m)   Wt (!) 154.2 kg   SpO2 96%   BMI 37.35 kg/m   Physical Exam Vitals and nursing note reviewed.  Constitutional:      General: He is not in acute distress.    Appearance: He is well-developed.  HENT:     Head: Normocephalic and atraumatic.  Eyes:     Conjunctiva/sclera: Conjunctivae normal.  Cardiovascular:     Rate and Rhythm: Normal rate and regular rhythm.     Heart sounds: No murmur heard. Pulmonary:     Effort: Pulmonary effort is normal. No respiratory distress.     Breath sounds: Normal breath sounds.  Abdominal:     Palpations: Abdomen is soft.     Tenderness: There is no abdominal tenderness.  Musculoskeletal:        General: No swelling.     Cervical back: Neck supple.     Comments: Patient able to range right upper extremity fully.  Tender palpation anterior medial epicondyle.  Pain most so with resisted pronation of right arm.  Pedal pulses 2+ bilateral.  Symmetric grip strength, flexion/extension.  No obvious  deformity present.  Skin:    General: Skin is warm and dry.     Capillary Refill: Capillary refill takes less than 2 seconds.  Neurological:     Mental Status: He is alert.  Psychiatric:        Mood and Affect: Mood normal.     (all labs ordered are listed, but only abnormal results are displayed) Labs Reviewed - No data to display  EKG: None  Radiology: No results found.   Procedures   Medications Ordered in the ED  HYDROcodone -acetaminophen  (NORCO/VICODIN) 5-325 MG per tablet 1 tablet (has no administration in time range)                                    Medical Decision Making Amount and/or Complexity of Data Reviewed Radiology:  ordered.  Risk Prescription drug management.   This patient presents to the ED for concern of elbow pain, this involves an extensive number of treatment options, and is a complaint that carries with it a high risk of complications and morbidity.  The differential diagnosis includes fracture, strain/sprain, dislocation, ligamentous/tendinous injury, MSK compromise, other   Co morbidities that complicate the patient evaluation  See HPI   Additional history obtained:  Additional history obtained from EMR External records from outside source obtained and reviewed including hospital records   Lab Tests:  N/a   Imaging Studies ordered:  I ordered imaging studies including right elbow x-ray I independently visualized and interpreted imaging which showed no acute osseous abnormality I agree with the radiologist interpretation   Cardiac Monitoring: / EKG:  N/a   Consultations Obtained:  N/a   Problem List / ED Course / Critical interventions / Medication management  Right elbow pain I ordered medication including norco    Reevaluation of the patient after these medicines showed that the patient improved I have reviewed the patients home medicines and have made adjustments as needed   Social Determinants of Health:  Chronic cigar use.  Denies illicit drug use.   Test / Admission - Considered:  Right elbow pain Vitals signs significant for htn. Otherwise within normal range and stable throughout visit. Laboratory/imaging studies significant for: see above 48 year old male presents emergency department with complaints of right arm pain.  States that he was helping move a washer machine on the stairs when it slipped.  States that he felt a popping sensation in his right forearm and he had pain specifically with turning his hand over.  Denies any other pain/injury/traumas.  Denies any weakness or sensory deficits in right hand.  Presents emergency department for further  assessment. On exam, tenderness medial epicondyle with pain most exacerbated with resisted pronation.  No evidence clinically neurovascular compromise.  No upper extremity edema concerning for DVT.  X-ray obtained by triage staff was negative for acute osseous abnormality.  Suspect muscular/tendinous injury.  Will recommend symptomatic therapy and referral to orthopedics for further assessment/evaluation.  Treatment plan discussed with patient and he is understanding was agreeable.  Patient well-appearing, afebrile in no acute distress. Worrisome signs and symptoms were discussed with the patient, and the patient acknowledged understanding to return to the ED if noticed. Patient was stable upon discharge.       Final diagnoses:  None    ED Discharge Orders     None          Silver Wonda LABOR, [place] 12/11/23 2101    Gennaro,  Megan L, DO 12/12/23 1036

## 2023-12-11 NOTE — ED Triage Notes (Signed)
 Pt via POV c/o right elbow pain. He was trying to move a washing machine and it fell down the steps; he felt a pop in the arm when he tried to stop the fall. Pt has full ROM and sensation is intact distal to the injury; pronation/supination is painful.

## 2023-12-11 NOTE — ED Notes (Addendum)
 Pt. Reports trying to catch a washing machine falling down the stairs causing injury to his R arm.

## 2023-12-11 NOTE — Discharge Instructions (Addendum)
 Your x-ray did not show any obvious bony abnormality.  Concern for muscular/tendinous injury.  Will recommend close follow-up with orthopedic in the outpatient setting for reassessment.  Will send in medicine to take as needed for pain/inflammation.  Please not hesitate to return to the emergency department if the worrisome signs and symptoms discussed become apparent.

## 2023-12-12 ENCOUNTER — Other Ambulatory Visit (HOSPITAL_COMMUNITY): Payer: Self-pay

## 2023-12-23 ENCOUNTER — Other Ambulatory Visit (HOSPITAL_COMMUNITY): Payer: Self-pay

## 2023-12-25 ENCOUNTER — Ambulatory Visit: Admitting: Physician Assistant

## 2023-12-25 DIAGNOSIS — M79601 Pain in right arm: Secondary | ICD-10-CM

## 2023-12-25 NOTE — Progress Notes (Signed)
 Office Visit Note   Patient: William Franco           Date of Birth: 1975/11/03           MRN: [id number] Visit Date: 12/25/2023              Requested by: Donah Laymon PARAS, MD [address] [place],  [place] [address] PCP: Donah Laymon PARAS, MD   Assessment & Plan: Visit Diagnoses:  1. Right arm pain     Plan: Impression is right distal biceps rupture.  At this point, recommended urgent MRI to assess for structural abnormalities.  He will follow-up once this has been completed.  Work note provided today.  Call with concerns or questions.  Follow-Up Instructions: Return for f/u after MRI.   Orders:  Orders Placed This Encounter  Procedures   MR Elbow Right w/o contrast   No orders of the defined types were placed in this encounter.     Procedures: No procedures performed   Clinical Data: No additional findings.   Subjective: Chief Complaint  Patient presents with   Right Elbow - Pain, Injury    HPI patient is a very pleasant 48 year old ambidextrous gentleman who comes in today following an injury to his right elbow.  Approximately 2 weeks ago, he was helping lift a washing machine up a set of stairs when the washer machine slipped causing him to extend his elbow while trying to catch the washing machine.  He felt a pop and had pain along the medial aspect.  He was seen in the ED on 12/11/2023 where x-rays were obtained.  These were negative for acute findings.  He is here today for further evaluation treat recommendation.  He tells me initially had a fair amount of bruising along the volar forearm and into the medial elbow following the injury.  His symptoms have mildly improved.  He continues to experience weakness with any lifting.  Review of Systems as detailed in HPI.  All others reviewed and are negative.   Objective: Vital Signs: There were no vitals taken for this visit.  Physical Exam well-developed well-nourished gentleman in no acute  distress.  Alert and oriented x 3.  Ortho Exam right elbow exam: He does have a small bulge proximal to the medial aspect of the antecubital fossa.  This is mildly tender.  He does have a positive hook sign.  Painless range of motion in all planes of the right elbow.  4 out of 5 strength with resisted biceps curl.  He is neurovascularly intact distally.  Specialty Comments:  No specialty comments available.  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   Seasonal allergies 10/19/2018   Gout 08/06/2016   Erectile dysfunction 04/19/2012   Hypertension 12/18/2010   Recurrent genital herpes 12/18/2010   Tobacco abuse 12/18/2010   Obesity 12/18/2010   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    Family History  Problem Relation Age of Onset   Hypertension Father    Hypertension Mother    Colon cancer Neg Hx    Colon polyps Neg Hx  Esophageal cancer Neg Hx    Rectal cancer Neg Hx    Stomach cancer Neg Hx     Past Surgical History:  Procedure Laterality Date   BACK SURGERY     ENDOVENOUS ABLATION SAPHENOUS VEIN W/ LASER Left 06/23/2019   endovenous laser ablation left greater saphenous vein and stab phlebectomy 10-20 incisions left leg by Medford Blade MD    Social History   Occupational History   Not on file  Tobacco Use   Smoking status: Every Day    Types: Cigars   Smokeless tobacco: Never  Vaping Use   Vaping status: Never Used  Substance and Sexual Activity   Alcohol use: Yes    Comment: occasionally   Drug use: No   Sexual activity: Not on file

## 2023-12-29 ENCOUNTER — Encounter: Payer: Self-pay | Admitting: Physician Assistant

## 2023-12-30 ENCOUNTER — Ambulatory Visit: Payer: Self-pay | Admitting: Physician Assistant

## 2023-12-30 ENCOUNTER — Ambulatory Visit
Admission: RE | Admit: 2023-12-30 | Discharge: 2023-12-30 | Disposition: A | Discharge status: Home / Self Care | Source: Ambulatory Visit | Attending: Physician Assistant | Admitting: Physician Assistant

## 2023-12-30 DIAGNOSIS — M79601 Pain in right arm: Secondary | ICD-10-CM

## 2023-12-30 NOTE — Progress Notes (Signed)
 F/u with xu asap

## 2024-01-01 ENCOUNTER — Ambulatory Visit: Admitting: Orthopaedic Surgery

## 2024-01-01 ENCOUNTER — Encounter (HOSPITAL_BASED_OUTPATIENT_CLINIC_OR_DEPARTMENT_OTHER): Payer: Self-pay | Admitting: Orthopaedic Surgery

## 2024-01-01 ENCOUNTER — Encounter: Payer: Self-pay | Admitting: Orthopaedic Surgery

## 2024-01-01 ENCOUNTER — Other Ambulatory Visit: Payer: Self-pay

## 2024-01-01 DIAGNOSIS — S46211A Strain of muscle, fascia and tendon of other parts of biceps, right arm, initial encounter: Secondary | ICD-10-CM | POA: Diagnosis not present

## 2024-01-01 NOTE — Progress Notes (Signed)
 Office Visit Note   Patient: William Franco           Date of Birth: 1976-02-26           MRN: [id number] Visit Date: 01/01/2024              Requested by: Donah Laymon PARAS, MD [address] [place],  [place] [address] PCP: Donah Laymon PARAS, MD   Assessment & Plan: Visit Diagnoses:  1. Rupture of right distal biceps tendon, initial encounter     Plan: History of Present Illness William Franco is a 48 year old male who presents with right elbow pain following an injury.  On December 11, 2023, he injured his right elbow while moving a washing machine up the stairs. He felt a 'pop' in the elbow when trying to prevent the machine from falling.  Since the injury, he experiences weakness in the right arm and cannot lift heavy objects. The elbow feels 'weird' with minimal pain unless lifting heavy items. Bruising appeared above the elbow a few days post-injury.  He saw Morna last week and had an MRI due to concern for distal biceps rupture  Physical Exam MUSCULOSKELETAL: Biceps tendon not palpable in right elbow. Pressure felt in right elbow during resistance to elbow flexion and supination.  Assessment and Plan Right distal biceps tendon rupture - Recommend surgical reattachment to prevent long-term weakness. Surgery to be performed soon for optimal outcomes. - Explained risks of surgery, including potential permanent forearm numbness. - Recovery: initial immobilization, gradual mobilization, full expected recovery in 6-9 months.  Follow-Up Instructions: No follow-ups on file.   Orders:  No orders of the defined types were placed in this encounter.  No orders of the defined types were placed in this encounter.     Procedures: No procedures performed   Clinical Data: No additional findings.   Subjective: Chief Complaint  Patient presents with   Right Elbow - Follow-up    MRI review    HPI  Review of Systems  Constitutional: Negative.    HENT: Negative.    Eyes: Negative.   Respiratory: Negative.    Cardiovascular: Negative.   Gastrointestinal: Negative.   Endocrine: Negative.   Genitourinary: Negative.   Skin: Negative.   Allergic/Immunologic: Negative.   Neurological: Negative.   Hematological: Negative.   Psychiatric/Behavioral: Negative.    All other systems reviewed and are negative.    Objective: Vital Signs: There were no vitals taken for this visit.  Physical Exam Vitals and nursing note reviewed.  Constitutional:      Appearance: He is well-developed.  HENT:     Head: Normocephalic and atraumatic.  Eyes:     Pupils: Pupils are equal, round, and reactive to light.  Pulmonary:     Effort: Pulmonary effort is normal.  Abdominal:     Palpations: Abdomen is soft.  Musculoskeletal:        General: Normal range of motion.     Cervical back: Neck supple.  Skin:    General: Skin is warm.  Neurological:     Mental Status: He is alert and oriented to person, place, and time.  Psychiatric:        Behavior: Behavior normal.        Thought Content: Thought content normal.        Judgment: Judgment normal.     Ortho Exam  Specialty Comments:  No specialty comments available.  Imaging: No results found.   PMFS History: Patient Active Problem  List   Diagnosis Date Noted   Routine adult health maintenance 06/06/2023   Cluster headache, intractable 08/31/2021   Hyperlipidemia 04/02/2021   Pain in the abdomen 08/09/2019   Prediabetes 05/18/2019   Varicose vein of leg 10/19/2018   Seasonal allergies 10/19/2018   Gout 08/06/2016   Erectile dysfunction 04/19/2012   Hypertension 12/18/2010   Recurrent genital herpes 12/18/2010   Tobacco abuse 12/18/2010   Obesity 12/18/2010   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    Family History  Problem Relation Age of Onset    Hypertension Father    Hypertension Mother    Colon cancer Neg Hx    Colon polyps Neg Hx    Esophageal cancer Neg Hx    Rectal cancer Neg Hx    Stomach cancer Neg Hx     Past Surgical History:  Procedure Laterality Date   BACK SURGERY     ENDOVENOUS ABLATION SAPHENOUS VEIN W/ LASER Left 06/23/2019   endovenous laser ablation left greater saphenous vein and stab phlebectomy 10-20 incisions left leg by Medford Blade MD    Social History   Occupational History   Not on file  Tobacco Use   Smoking status: Every Day    Types: Cigars   Smokeless tobacco: Never  Vaping Use   Vaping status: Never Used  Substance and Sexual Activity   Alcohol use: Yes    Comment: occasionally   Drug use: No   Sexual activity: Not on file

## 2024-01-01 NOTE — Progress Notes (Signed)
   01/01/24 1151  OBSTRUCTIVE SLEEP APNEA  Have you ever been diagnosed with sleep apnea through a sleep study? No  Do you snore loudly (loud enough to be heard through closed doors)?  0  Do you often feel tired, fatigued, or sleepy during the daytime (such as falling asleep during driving or talking to someone)? 0  Has anyone observed you stop breathing during your sleep? 0  Do you have, or are you being treated for high blood pressure? 1  BMI more than 35 kg/m2? 1  Age > 50 (1-yes) 0  Neck circumference greater than:Male 16 inches or larger, Male 17inches or larger? 1  Male Gender (Yes=1) 1  Obstructive Sleep Apnea Score 4  Score 5 or greater  Results sent to PCP

## 2024-01-04 ENCOUNTER — Other Ambulatory Visit: Payer: Self-pay | Admitting: Physician Assistant

## 2024-01-04 ENCOUNTER — Other Ambulatory Visit (HOSPITAL_COMMUNITY): Payer: Self-pay

## 2024-01-04 MED ORDER — HYDROCODONE-ACETAMINOPHEN 5-325 MG PO TABS
1.0000 | ORAL_TABLET | Freq: Three times a day (TID) | ORAL | 0 refills | Status: DC | PRN
  Filled 2024-01-04: qty 21, 7d supply, fill #0

## 2024-01-04 MED ORDER — ONDANSETRON HCL 4 MG PO TABS
4.0000 mg | ORAL_TABLET | Freq: Three times a day (TID) | ORAL | 0 refills | Status: AC | PRN
  Filled 2024-01-04: qty 40, 14d supply, fill #0

## 2024-01-04 MED ORDER — METHOCARBAMOL 500 MG PO TABS
500.0000 mg | ORAL_TABLET | Freq: Two times a day (BID) | ORAL | 0 refills | Status: AC | PRN
  Filled 2024-01-04: qty 20, 10d supply, fill #0

## 2024-01-05 ENCOUNTER — Encounter (HOSPITAL_BASED_OUTPATIENT_CLINIC_OR_DEPARTMENT_OTHER)
Admission: RE | Admit: 2024-01-05 | Discharge: 2024-01-05 | Disposition: A | Discharge status: Home / Self Care | Source: Ambulatory Visit | Attending: Orthopaedic Surgery | Admitting: Orthopaedic Surgery

## 2024-01-05 DIAGNOSIS — E119 Type 2 diabetes mellitus without complications: Secondary | ICD-10-CM | POA: Insufficient documentation

## 2024-01-05 DIAGNOSIS — Z01818 Encounter for other preprocedural examination: Secondary | ICD-10-CM | POA: Insufficient documentation

## 2024-01-05 DIAGNOSIS — X58XXXA Exposure to other specified factors, initial encounter: Secondary | ICD-10-CM | POA: Diagnosis not present

## 2024-01-05 DIAGNOSIS — I10 Essential (primary) hypertension: Secondary | ICD-10-CM | POA: Diagnosis not present

## 2024-01-05 DIAGNOSIS — S46211A Strain of muscle, fascia and tendon of other parts of biceps, right arm, initial encounter: Secondary | ICD-10-CM | POA: Diagnosis present

## 2024-01-05 DIAGNOSIS — F17290 Nicotine dependence, other tobacco product, uncomplicated: Secondary | ICD-10-CM | POA: Diagnosis not present

## 2024-01-05 LAB — BASIC METABOLIC PANEL WITH GFR
Anion gap: 12 (ref 5–15)
BUN: 14 mg/dL (ref 6–20)
CO2: 26 mmol/L (ref 22–32)
Calcium: 9.1 mg/dL (ref 8.9–10.3)
Chloride: 100 mmol/L (ref 98–111)
Creatinine, Ser: 0.92 mg/dL (ref 0.61–1.24)
GFR, Estimated: >60 mL/min (ref >60)
Glucose, Bld: 115 mg/dL — ABNORMAL HIGH (ref 70–99)
Potassium: 4.2 mmol/L (ref 3.5–5.1)
Sodium: 138 mmol/L (ref 135–145)

## 2024-01-05 NOTE — Progress Notes (Signed)

## 2024-01-06 ENCOUNTER — Encounter (HOSPITAL_BASED_OUTPATIENT_CLINIC_OR_DEPARTMENT_OTHER): Payer: Self-pay | Admitting: Orthopaedic Surgery

## 2024-01-06 ENCOUNTER — Ambulatory Visit (HOSPITAL_COMMUNITY)

## 2024-01-06 ENCOUNTER — Ambulatory Visit (HOSPITAL_BASED_OUTPATIENT_CLINIC_OR_DEPARTMENT_OTHER): Payer: Self-pay | Admitting: Anesthesiology

## 2024-01-06 ENCOUNTER — Ambulatory Visit (HOSPITAL_BASED_OUTPATIENT_CLINIC_OR_DEPARTMENT_OTHER)
Admission: RE | Admit: 2024-01-06 | Discharge: 2024-01-06 | Disposition: A | Discharge status: Home / Self Care | Attending: Orthopaedic Surgery | Admitting: Orthopaedic Surgery

## 2024-01-06 ENCOUNTER — Encounter (HOSPITAL_BASED_OUTPATIENT_CLINIC_OR_DEPARTMENT_OTHER)
Admission: RE | Disposition: A | Discharge status: Home / Self Care | Payer: Self-pay | Source: Home / Self Care | Attending: Orthopaedic Surgery

## 2024-01-06 ENCOUNTER — Other Ambulatory Visit: Payer: Self-pay

## 2024-01-06 ENCOUNTER — Encounter (HOSPITAL_BASED_OUTPATIENT_CLINIC_OR_DEPARTMENT_OTHER): Payer: Self-pay | Admitting: Anesthesiology

## 2024-01-06 DIAGNOSIS — X58XXXA Exposure to other specified factors, initial encounter: Secondary | ICD-10-CM | POA: Insufficient documentation

## 2024-01-06 DIAGNOSIS — S46211A Strain of muscle, fascia and tendon of other parts of biceps, right arm, initial encounter: Secondary | ICD-10-CM | POA: Insufficient documentation

## 2024-01-06 DIAGNOSIS — E119 Type 2 diabetes mellitus without complications: Secondary | ICD-10-CM

## 2024-01-06 DIAGNOSIS — F17290 Nicotine dependence, other tobacco product, uncomplicated: Secondary | ICD-10-CM | POA: Insufficient documentation

## 2024-01-06 DIAGNOSIS — I10 Essential (primary) hypertension: Secondary | ICD-10-CM | POA: Insufficient documentation

## 2024-01-06 HISTORY — PX: DISTAL BICEPS TENDON REPAIR: SHX1461

## 2024-01-06 HISTORY — DX: Prediabetes: R73.03

## 2024-01-06 SURGERY — REPAIR, TENDON, BICEPS, DISTAL
Anesthesia: General | Laterality: Right

## 2024-01-06 MED ORDER — ACETAMINOPHEN 500 MG PO TABS
ORAL_TABLET | ORAL | Status: AC
Start: 2024-01-06 — End: 2024-01-06
  Filled 2024-01-06: qty 1

## 2024-01-06 MED ORDER — FENTANYL CITRATE (PF) 100 MCG/2ML IJ SOLN
INTRAMUSCULAR | Status: AC
  Filled 2024-01-06: qty 2

## 2024-01-06 MED ORDER — CELECOXIB 200 MG PO CAPS
ORAL_CAPSULE | ORAL | Status: AC
Start: 2024-01-06 — End: 2024-01-06
  Filled 2024-01-06: qty 1

## 2024-01-06 MED ORDER — OXYCODONE HCL 5 MG PO TABS
5.0000 mg | ORAL_TABLET | Freq: Once | ORAL | Status: AC | PRN
  Administered 2024-01-06: 5 mg via ORAL

## 2024-01-06 MED ORDER — CEFAZOLIN SODIUM-DEXTROSE 3-4 GM/150ML-% IV SOLN
3.0000 g | INTRAVENOUS | Status: AC
  Administered 2024-01-06: 3 g via INTRAVENOUS

## 2024-01-06 MED ORDER — CELECOXIB 200 MG PO CAPS
200.0000 mg | ORAL_CAPSULE | Freq: Once | ORAL | Status: AC
  Administered 2024-01-06: 200 mg via ORAL

## 2024-01-06 MED ORDER — MIDAZOLAM HCL 2 MG/2ML IJ SOLN
INTRAMUSCULAR | Status: AC
  Filled 2024-01-06: qty 2

## 2024-01-06 MED ORDER — LIDOCAINE HCL (CARDIAC) PF 100 MG/5ML IV SOSY
PREFILLED_SYRINGE | INTRAVENOUS | Status: DC | PRN
  Administered 2024-01-06: 80 mg via INTRAVENOUS

## 2024-01-06 MED ORDER — ONDANSETRON HCL 4 MG/2ML IJ SOLN
4.0000 mg | Freq: Once | INTRAMUSCULAR | Status: AC | PRN
  Administered 2024-01-06: 4 mg via INTRAVENOUS

## 2024-01-06 MED ORDER — CEFAZOLIN SODIUM-DEXTROSE 3-4 GM/150ML-% IV SOLN
INTRAVENOUS | Status: AC
Start: 2024-01-06 — End: 2024-01-06
  Filled 2024-01-06: qty 150

## 2024-01-06 MED ORDER — FENTANYL CITRATE (PF) 100 MCG/2ML IJ SOLN
100.0000 ug | Freq: Once | INTRAMUSCULAR | Status: AC
  Administered 2024-01-06: 100 ug via INTRAVENOUS

## 2024-01-06 MED ORDER — 0.9 % SODIUM CHLORIDE (POUR BTL) OPTIME
TOPICAL | Status: DC | PRN
  Administered 2024-01-06: 1000 mL

## 2024-01-06 MED ORDER — ONDANSETRON HCL 4 MG/2ML IJ SOLN
INTRAMUSCULAR | Status: AC
  Filled 2024-01-06: qty 2

## 2024-01-06 MED ORDER — DEXAMETHASONE SODIUM PHOSPHATE 10 MG/ML IJ SOLN
INTRAMUSCULAR | Status: DC | PRN
Start: 2024-01-06 — End: 2024-01-06
  Administered 2024-01-06: 10 mg via PERINEURAL

## 2024-01-06 MED ORDER — DEXAMETHASONE SODIUM PHOSPHATE 10 MG/ML IJ SOLN
INTRAMUSCULAR | Status: DC | PRN
  Administered 2024-01-06: 10 mg via INTRAVENOUS

## 2024-01-06 MED ORDER — OXYCODONE HCL 5 MG/5ML PO SOLN: 5.0000 mg | Freq: Once | ORAL | Status: AC | PRN

## 2024-01-06 MED ORDER — BUPIVACAINE HCL (PF) 0.5 % IJ SOLN
INTRAMUSCULAR | Status: DC | PRN
  Administered 2024-01-06: 20 mL via PERINEURAL

## 2024-01-06 MED ORDER — ACETAMINOPHEN 500 MG PO TABS
ORAL_TABLET | ORAL | Status: AC
  Filled 2024-01-06: qty 2

## 2024-01-06 MED ORDER — LACTATED RINGERS IV SOLN: INTRAVENOUS | Status: DC

## 2024-01-06 MED ORDER — FENTANYL CITRATE (PF) 100 MCG/2ML IJ SOLN
25.0000 ug | INTRAMUSCULAR | Status: DC | PRN
  Administered 2024-01-06 (×2): 50 ug via INTRAVENOUS

## 2024-01-06 MED ORDER — OXYCODONE HCL 5 MG PO TABS
ORAL_TABLET | ORAL | Status: AC
  Filled 2024-01-06: qty 1

## 2024-01-06 MED ORDER — ACETAMINOPHEN 500 MG PO TABS
1000.0000 mg | ORAL_TABLET | Freq: Once | ORAL | Status: AC
  Administered 2024-01-06: 1000 mg via ORAL

## 2024-01-06 MED ORDER — LIDOCAINE 2% (20 MG/ML) 5 ML SYRINGE
INTRAMUSCULAR | Status: AC
Start: 2024-01-06 — End: 2024-01-06
  Filled 2024-01-06: qty 5

## 2024-01-06 MED ORDER — MEPERIDINE HCL 25 MG/ML IJ SOLN: 6.2500 mg | INTRAMUSCULAR | Status: DC | PRN

## 2024-01-06 MED ORDER — PROPOFOL 10 MG/ML IV BOLUS
INTRAVENOUS | Status: DC | PRN
  Administered 2024-01-06: 200 mg via INTRAVENOUS

## 2024-01-06 MED ORDER — MIDAZOLAM HCL 2 MG/2ML IJ SOLN
2.0000 mg | Freq: Once | INTRAMUSCULAR | Status: AC
  Administered 2024-01-06: 2 mg via INTRAVENOUS

## 2024-01-06 MED ORDER — FENTANYL CITRATE (PF) 100 MCG/2ML IJ SOLN
INTRAMUSCULAR | Status: DC | PRN
  Administered 2024-01-06 (×2): 50 ug via INTRAVENOUS

## 2024-01-06 SURGICAL SUPPLY — 61 items
BLADE SURG 15 STRL LF DISP TIS (BLADE) ×2 IMPLANT
BNDG COMPR ESMARK 4X3 LF (GAUZE/BANDAGES/DRESSINGS) ×1 IMPLANT
BNDG ELASTIC 3INX 5YD STR LF (GAUZE/BANDAGES/DRESSINGS) IMPLANT
BNDG ELASTIC 4INX 5YD STR LF (GAUZE/BANDAGES/DRESSINGS) IMPLANT
BNDG ELASTIC 6INX 5YD STR LF (GAUZE/BANDAGES/DRESSINGS) ×1 IMPLANT
CLSR STERI-STRIP ANTIMIC 1/2X4 (GAUZE/BANDAGES/DRESSINGS) IMPLANT
CORD BIPOLAR FORCEPS 12FT (ELECTRODE) ×1 IMPLANT
COVER BACK TABLE 60X90IN (DRAPES) ×1 IMPLANT
CUFF TOURN SGL QUICK 18X3 (MISCELLANEOUS) ×1 IMPLANT
CUFF TOURN SGL QUICK 18X4 (TOURNIQUET CUFF) IMPLANT
CUFF TRNQT CYL 24X4X16.5-23 (TOURNIQUET CUFF) IMPLANT
DERMABOND ADVANCED .7 DNX12 (GAUZE/BANDAGES/DRESSINGS) IMPLANT
DRAPE C-ARM 42X72 X-RAY (DRAPES) ×1 IMPLANT
DRAPE EXTREMITY T 121X128X90 (DISPOSABLE) ×1 IMPLANT
DRAPE IMP U-DRAPE 54X76 (DRAPES) ×1 IMPLANT
DRAPE SURG 17X23 STRL (DRAPES) ×1 IMPLANT
DURAPREP 26ML APPLICATOR (WOUND CARE) ×1 IMPLANT
ELECTRODE REM PT RTRN 9FT ADLT (ELECTROSURGICAL) IMPLANT
EXTENSION HOSE W/PLC CONNECTON (MISCELLANEOUS) IMPLANT
GAUZE SPONGE 4X4 12PLY STRL (GAUZE/BANDAGES/DRESSINGS) ×1 IMPLANT
GLOVE BIOGEL PI IND STRL 7.5 (GLOVE) ×1 IMPLANT
GLOVE ECLIPSE 7.0 STRL STRAW (GLOVE) ×1 IMPLANT
GLOVE INDICATOR 7.0 STRL GRN (GLOVE) ×1 IMPLANT
GLOVE SURG SYN 7.5 PF PI (GLOVE) ×1 IMPLANT
GOWN STRL REUS W/ TWL LRG LVL3 (GOWN DISPOSABLE) ×1 IMPLANT
GOWN STRL REUS W/ TWL XL LVL3 (GOWN DISPOSABLE) ×1 IMPLANT
GOWN STRL SURGICAL XL XLNG (GOWN DISPOSABLE) ×1 IMPLANT
MAT PREVALON FULL STRYKER (MISCELLANEOUS) IMPLANT
NDL PRECISIONGLIDE 27X1.5 (NEEDLE) IMPLANT
NDL SUT 6 .5 CRC .975X.05 MAYO (NEEDLE) ×1 IMPLANT
NEEDLE PRECISIONGLIDE 27X1.5 (NEEDLE) IMPLANT
NS IRRIG 1000ML POUR BTL (IV SOLUTION) ×1 IMPLANT
PACK BASIN DAY SURGERY FS (CUSTOM PROCEDURE TRAY) ×1 IMPLANT
PAD CAST 4YDX4 CTTN HI CHSV (CAST SUPPLIES) ×2 IMPLANT
PADDING CAST SYNTHETIC 3X4 NS (CAST SUPPLIES) IMPLANT
PADDING CAST SYNTHETIC 4X4 STR (CAST SUPPLIES) IMPLANT
PENCIL SMOKE EVACUATOR (MISCELLANEOUS) IMPLANT
PIN DRILL ACL TIGHTROPE 4MM (PIN) IMPLANT
SHEET MEDIUM DRAPE 40X70 STRL (DRAPES) ×1 IMPLANT
SLEEVE SCD COMPRESS KNEE MED (STOCKING) ×1 IMPLANT
SLING ARM FOAM STRAP XLG (SOFTGOODS) IMPLANT
SPIKE FLUID TRANSFER (MISCELLANEOUS) IMPLANT
SPLINT FIBERGLASS 3X35 (CAST SUPPLIES) ×30 IMPLANT
SPLINT FIBERGLASS 4X30 (CAST SUPPLIES) ×1 IMPLANT
SPONGE T-LAP 18X18 ~~LOC~~+RFID (SPONGE) ×1 IMPLANT
STOCKINETTE 4X48 STRL (DRAPES) ×1 IMPLANT
STOCKINETTE IMPERVIOUS 9X36 MD (GAUZE/BANDAGES/DRESSINGS) IMPLANT
SUT ETHILON 3 0 PS 1 (SUTURE) IMPLANT
SUT MNCRL AB 3-0 PS2 18 (SUTURE) IMPLANT
SUT MNCRL AB 4-0 PS2 18 (SUTURE) IMPLANT
SUT SILK 0 TIES 10X30 (SUTURE) IMPLANT
SUT VIC AB 2-0 CT1 TAPERPNT 27 (SUTURE) IMPLANT
SUT VIC AB 2-0 SH 27XBRD (SUTURE) IMPLANT
SUT VIC AB 3-0 PS1 18XBRD (SUTURE) IMPLANT
SUTURE FIBERWR #2 38 T-5 BLUE (SUTURE) IMPLANT
SUTURE TAPE 1.3 FIBERLOP 20 ST (SUTURE) IMPLANT
SYR BULB EAR ULCER 3OZ GRN STR (SYRINGE) ×1 IMPLANT
SYR CONTROL 10ML LL (SYRINGE) IMPLANT
SYSTEM ARTHRO FOR DISTAL BICEP (Orthopedic Implant) IMPLANT
TOWEL GREEN STERILE FF (TOWEL DISPOSABLE) ×2 IMPLANT
UNDERPAD 30X36 HEAVY ABSORB (UNDERPADS AND DIAPERS) ×1 IMPLANT

## 2024-01-06 NOTE — Transfer of Care (Signed)
 Immediate Anesthesia Transfer of Care Note  Patient: William Franco  Procedure(s) Performed: REPAIR, TENDON, BICEPS, DISTAL (Right)  Patient Location: PACU  Anesthesia Type:GA combined with regional for post-op pain  Level of Consciousness: drowsy  Airway & Oxygen Therapy: Patient Spontanous Breathing and Patient connected to face mask oxygen  Post-op Assessment: Report given to RN and Post -op Vital signs reviewed and stable  Post vital signs: Reviewed and stable  Last Vitals:  Vitals Value Taken Time  BP 160/91 01/06/24 14:35  Temp    Pulse 54 01/06/24 14:36  Resp 9 01/06/24 14:36  SpO2 99 % 01/06/24 14:36  Vitals shown include unfiled device data.  Last Pain:  Vitals:   01/06/24 1141  TempSrc: Oral  PainSc: 0-No pain      Patients Stated Pain Goal: 7 (01/06/24 1141)  Complications: No notable events documented.

## 2024-01-06 NOTE — Discharge Instructions (Addendum)
 Postoperative instructions:  Weightbearing instructions: non weight bearing  Dressing instructions: Keep your dressing and/or splint clean and dry at all times.  It will be removed at your first post-operative appointment.  Your stitches and/or staples will be removed at this visit.  Incision instructions:  Do not soak your incision for 3 weeks after surgery.  If the incision gets wet, pat dry and do not scrub the incision.  Pain control:  You have been given a prescription to be taken as directed for post-operative pain control.  In addition, elevate the operative extremity above the heart at all times to prevent swelling and throbbing pain.  Take over-the-counter Colace, 100mg  by mouth twice a day while taking narcotic pain medications to help prevent constipation.  Follow up appointments: 1) 7 days for suture removal and wound check. 2) Dr. Jerri as scheduled.   -------------------------------------------------------------------------------------------------------------  After Surgery Pain Control:  After your surgery, post-surgical discomfort or pain is likely. This discomfort can last several days to a few weeks. At certain times of the day your discomfort may be more intense.  Did you receive a nerve block?  A nerve block can provide pain relief for one hour to two days after your surgery. As long as the nerve block is working, you will experience little or no sensation in the area the surgeon operated on.  As the nerve block wears off, you will begin to experience pain or discomfort. It is very important that you begin taking your prescribed pain medication before the nerve block fully wears off. Treating your pain at the first sign of the block wearing off will ensure your pain is better controlled and more tolerable when full-sensation returns. Do not wait until the pain is intolerable, as the medicine will be less effective. It is better to treat pain in advance than to try and  catch up.  General Anesthesia:  If you did not receive a nerve block during your surgery, you will need to start taking your pain medication shortly after your surgery and should continue to do so as prescribed by your surgeon.  Pain Medication:  Most commonly we prescribe Vicodin and Percocet for post-operative pain. Both of these medications contain a combination of acetaminophen  (Tylenol ) and a narcotic to help control pain.   It takes between 30 and 45 minutes before pain medication starts to work. It is important to take your medication before your pain level gets too intense.   Nausea is a common side effect of many pain medications. You will want to eat something before taking your pain medicine to help prevent nausea.   If you are taking a prescription pain medication that contains acetaminophen , we recommend that you do not take additional over the counter acetaminophen  (Tylenol ).  Other pain relieving options:   Using a cold pack to ice the affected area a few times a day (15 to 20 minutes at a time) can help to relieve pain, reduce swelling and bruising.   Elevation of the affected area can also help to reduce pain and swelling.  Per [facility] clinic policy, our goal is ensure optimal postoperative pain control with a multimodal pain management strategy. For all [facility] patients, our goal is to wean post-operative narcotic medications by 6 weeks post-operatively. If this is not possible due to utilization of pain medication prior to surgery, your [facility] doctor will support your acute post-operative pain control for the first 6 weeks postoperatively, with a plan to transition you back to your  primary pain team following that. Maralee will work to ensure a Therapist, occupational.    Post Anesthesia Home Care Instructions  Activity: Get plenty of rest for the remainder of the day. A responsible individual must stay with you for 24 hours following the procedure.  For the next 24  hours, DO NOT: -Drive a car -Advertising copywriter -Drink alcoholic beverages -Take any medication unless instructed by your physician -Make any legal decisions or sign important papers.  Meals: Start with liquid foods such as gelatin or soup. Progress to regular foods as tolerated. Avoid greasy, spicy, heavy foods. If nausea and/or vomiting occur, drink only clear liquids until the nausea and/or vomiting subsides. Call your physician if vomiting continues.  Special Instructions/Symptoms: Your throat may feel dry or sore from the anesthesia or the breathing tube placed in your throat during surgery. If this causes discomfort, gargle with warm salt water. The discomfort should disappear within 24 hours.  If you had a scopolamine patch placed behind your ear for the management of post- operative nausea and/or vomiting:  1. The medication in the patch is effective for 72 hours, after which it should be removed.  Wrap patch in a tissue and discard in the trash. Wash hands thoroughly with soap and water. 2. You may remove the patch earlier than 72 hours if you experience unpleasant side effects which may include dry mouth, dizziness or visual disturbances. 3. Avoid touching the patch. Wash your hands with soap and water after contact with the patch.     Regional Anesthesia Blocks  1. You may not be able to move or feel the blocked extremity after a regional anesthetic block. This may last may last from 3-48 hours after placement, but it will go away. The length of time depends on the medication injected and your individual response to the medication. As the nerves start to wake up, you may experience tingling as the movement and feeling returns to your extremity. If the numbness and inability to move your extremity has not gone away after 48 hours, please call your surgeon.   2. The extremity that is blocked will need to be protected until the numbness is gone and the strength has returned. Because  you cannot feel it, you will need to take extra care to avoid injury. Because it may be weak, you may have difficulty moving it or using it. You may not know what position it is in without looking at it while the block is in effect.  3. For blocks in the legs and feet, returning to weight bearing and walking needs to be done carefully. You will need to wait until the numbness is entirely gone and the strength has returned. You should be able to move your leg and foot normally before you try and bear weight or walk. You will need someone to be with you when you first try to ensure you do not fall and possibly risk injury.  4. Bruising and tenderness at the needle site are common side effects and will resolve in a few days.  5. Persistent numbness or new problems with movement should be communicated to the surgeon or the [facility] [phone] [facility] [phone]).  *May have Tylenol  today at 6:30pm*

## 2024-01-06 NOTE — Anesthesia Postprocedure Evaluation (Signed)
 Anesthesia Post Note  Patient: GERY SABEDRA  Procedure(s) Performed: REPAIR, TENDON, BICEPS, DISTAL (Right)     Patient location during evaluation: PACU Anesthesia Type: General Level of consciousness: awake and alert Pain management: pain level controlled Vital Signs Assessment: post-procedure vital signs reviewed and stable Respiratory status: spontaneous breathing, nonlabored ventilation, respiratory function stable and patient connected to nasal cannula oxygen Cardiovascular status: blood pressure returned to baseline and stable Postop Assessment: no apparent nausea or vomiting Anesthetic complications: no   No notable events documented.  Last Vitals:  Vitals:   01/06/24 1437 01/06/24 1438  BP:    Pulse: (!) 54 (!) 54  Resp: (!) 9 (!) 0  Temp:    SpO2: 99% 98%    Last Pain:  Vitals:   01/06/24 1435  TempSrc:   PainSc: Asleep                 Braylin Xu

## 2024-01-06 NOTE — Anesthesia Procedure Notes (Signed)
 Procedure Name: LMA Insertion Date/Time: 01/06/2024 12:49 PM  Performed by: Debarah Chiquita LABOR, CRNAPre-anesthesia Checklist: Patient identified, Emergency Drugs available, Suction available and Patient being monitored Patient Re-evaluated:Patient Re-evaluated prior to induction Oxygen Delivery Method: Circle system utilized Preoxygenation: Pre-oxygenation with 100% oxygen Induction Type: IV induction Ventilation: Mask ventilation without difficulty LMA: LMA inserted LMA Size: 5.0 Number of attempts: 1 Airway Equipment and Method: Bite block Placement Confirmation: positive ETCO2 Tube secured with: Tape Dental Injury: Teeth and Oropharynx as per pre-operative assessment

## 2024-01-06 NOTE — H&P (Signed)
 PREOPERATIVE H&P  Chief Complaint: distal biceps rupture right  HPI: William Franco is a 48 y.o. male who presents for surgical treatment of distal biceps rupture right.  He denies any changes in medical history.  Past Surgical History:  Procedure Laterality Date   BACK SURGERY     ENDOVENOUS ABLATION SAPHENOUS VEIN W/ LASER Left 06/23/2019   endovenous laser ablation left greater saphenous vein and stab phlebectomy 10-20 incisions left leg by Medford Blade MD    Social History   Socioeconomic History   Marital status: Married    Spouse name: Not on file   Number of children: Not on file   Years of education: Not on file   Highest education level: Not on file  Occupational History   Not on file  Tobacco Use   Smoking status: Every Day    Types: Cigars   Smokeless tobacco: Never  Vaping Use   Vaping status: Never Used  Substance and Sexual Activity   Alcohol use: Yes    Comment: occasionally   Drug use: No   Sexual activity: Not on file  Other Topics Concern   Not on file  Social History Narrative   Divorced with no children. 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  Family in [place]         Social Drivers of Health   Financial Resource Strain: Not on file  Food Insecurity: Not on file  Transportation Needs: Not on file  Physical Activity: Not on file  Stress: Not on file  Social Connections: Not on file   Family History  Problem Relation Age of Onset   Hypertension Father    Hypertension Mother    Colon cancer Neg Hx    Colon polyps Neg Hx    Esophageal cancer Neg Hx    Rectal cancer Neg Hx    Stomach cancer Neg Hx    Allergies  Allergen Reactions   Bee Venom Swelling   Prior to Admission medications   Medication Sig Start Date End Date Taking? Authorizing Provider  amLODipine  (NORVASC ) 10 MG tablet Take 1 tablet (10 mg total) by mouth daily. 08/03/23  Yes Donah Laymon PARAS, MD  fluticasone  (FLONASE ) 50 MCG/ACT nasal spray Place 1 spray  into both nostrils daily as needed for allergies. 06/04/23  Yes Donah Laymon PARAS, MD  hydrochlorothiazide  (HYDRODIURIL ) 25 MG tablet Take 1 tablet (25 mg total) by mouth daily. 08/03/23  Yes Donah Laymon PARAS, MD  losartan  (COZAAR ) 100 MG tablet Take 1 tablet (100 mg total) by mouth daily. 08/03/23  Yes Donah Laymon PARAS, MD  metFORMIN  (GLUCOPHAGE -XR) 500 MG 24 hr tablet Take 1 tablet (500 mg total) by mouth daily with breakfast. 11/09/23  Yes Donah Laymon PARAS, MD  Multiple Vitamin (MULTIVITAMIN WITH MINERALS) TABS tablet Take 1 tablet by mouth daily.   Yes [provider]  rosuvastatin  (CRESTOR ) 10 MG tablet Take 1 tablet (10 mg total) by mouth daily. 11/09/23  Yes Donah Laymon PARAS, MD  sildenafil  (VIAGRA ) 100 MG tablet Take 1 tablet (100 mg total) by mouth as needed. 08/03/23  Yes Donah Laymon PARAS, MD  valACYclovir  (VALTREX ) 500 MG tablet Take 1 tablet (500 mg total) by mouth daily as needed 04/02/21  Yes Chambliss, Layman CROME, MD  celecoxib  (CELEBREX ) 200 MG capsule Take 1 capsule (200 mg total) by mouth 2 (two) times daily as needed. Patient not taking: Reported on 01/01/2024 12/11/23   Silver Wonda LABOR, PA  HYDROcodone -acetaminophen  (NORCO/VICODIN) 5-325 MG  tablet Take 1 tablet by mouth 3 (three) times daily as needed. 01/04/24   Jule Ronal CROME, PA-C  methocarbamol  (ROBAXIN ) 500 MG tablet Take 1 tablet (500 mg total) by mouth 2 (two) times daily as needed. 01/04/24   Jule Ronal CROME, PA-C  ondansetron  (ZOFRAN ) 4 MG tablet Take 1 tablet (4 mg total) by mouth every 8 (eight) hours as needed for nausea or vomiting. 01/04/24   Jule Ronal CROME, PA-C     Positive ROS: All other systems have been reviewed and were otherwise negative with the exception of those mentioned in the HPI and as above.  Physical Exam: General: Alert, no acute distress Cardiovascular: No pedal edema Respiratory: No cyanosis, no use of accessory musculature GI: abdomen soft Skin: No lesions in the area of  chief complaint Neurologic: Sensation intact distally Psychiatric: Patient is competent for consent with normal mood and affect Lymphatic: no lymphedema  MUSCULOSKELETAL: exam stable  Assessment: distal biceps rupture right  Plan: Plan for Procedure(s): REPAIR, TENDON, BICEPS, DISTAL  The risks benefits and alternatives were discussed with the patient including but not limited to the risks of nonoperative treatment, versus surgical intervention including infection, bleeding, nerve injury,  blood clots, cardiopulmonary complications, morbidity, mortality, among others, and they were willing to proceed.   Ozell Cummins, MD 01/06/2024 11:27 AM

## 2024-01-06 NOTE — Progress Notes (Signed)
Assisted Dr. Oddono with right, supraclavicular block. Side rails up, monitors on throughout procedure. See vital signs in flow sheet. Tolerated Procedure well. 

## 2024-01-06 NOTE — Anesthesia Procedure Notes (Addendum)
 Anesthesia Regional Block: Supraclavicular block   Pre-Anesthetic Checklist: , timeout performed,  Correct Patient, Correct Site, Correct Laterality,  Correct Procedure, Correct Position, site marked,  Risks and benefits discussed,  Surgical consent,  Pre-op evaluation,  At surgeon's request and post-op pain management  Laterality: Right  Prep: chloraprep       Needles:  Injection technique: Single-shot  Needle Type: Echogenic Stimulator Needle     Needle Length: 5cm  Needle Gauge: 22     Additional Needles:   Procedures:, nerve stimulator,,, ultrasound used (permanent image in chart),,     Nerve Stimulator or Paresthesia:  Response: hand, 0.45 mA  Additional Responses:   Narrative:  Start time: 01/06/2024 12:20 PM End time: 01/06/2024 12:25 PM Injection made incrementally with aspirations every 5 mL.  Performed by: Personally  Anesthesiologist: Mallory Manus, MD  Additional Notes: Functioning IV was confirmed and monitors were applied.  A 50mm 22ga Arrow echogenic stimulator needle was used. Sterile prep and drape,hand hygiene and sterile gloves were used. Ultrasound guidance: relevant anatomy identified, needle position confirmed, local anesthetic spread visualized around nerve(s)., vascular puncture avoided.  Image printed for medical record. Negative aspiration and negative test dose prior to incremental administration of local anesthetic. The patient tolerated the procedure well.

## 2024-01-06 NOTE — Anesthesia Preprocedure Evaluation (Addendum)
 Anesthesia Evaluation  Patient identified by MRN, date of birth, ID band Patient awake    Reviewed: Allergy & Precautions, H&P , NPO status , Patient's Chart, lab work & pertinent test results  Airway Mallampati: II  TM Distance: >3 FB Neck ROM: Full    Dental no notable dental hx. (+) Teeth Intact, Dental Advisory Given, Missing   Pulmonary neg pulmonary ROS, asthma , Current Smoker and Patient abstained from smoking.   Pulmonary exam normal breath sounds clear to auscultation       Cardiovascular hypertension, negative cardio ROS Normal cardiovascular exam Rhythm:Regular Rate:Normal     Neuro/Psych  Headaches negative neurological ROS  negative psych ROS   GI/Hepatic negative GI ROS, Neg liver ROS,,,  Endo/Other  negative endocrine ROS    Renal/GU Renal diseasenegative Renal ROS  negative genitourinary   Musculoskeletal negative musculoskeletal ROS (+)    Abdominal   Peds negative pediatric ROS (+)  Hematology negative hematology ROS (+)   Anesthesia Other Findings   Reproductive/Obstetrics negative OB ROS                              Anesthesia Physical Anesthesia Plan  ASA: 3  Anesthesia Plan: General   Post-op Pain Management: Minimal or no pain anticipated, Tylenol  PO (pre-op)* and Celebrex  PO (pre-op)*   Induction:   PONV Risk Score and Plan: 2 and Ondansetron , Dexamethasone  and Treatment may vary due to age or medical condition  Airway Management Planned: Oral ETT and LMA  Additional Equipment: None  Intra-op Plan:   Post-operative Plan: Extubation in OR  Informed Consent: I have reviewed the patients History and Physical, chart, labs and discussed the procedure including the risks, benefits and alternatives for the proposed anesthesia with the patient or authorized representative who has indicated his/her understanding and acceptance.     Dental advisory  given  Plan Discussed with: Anesthesiologist and CRNA  Anesthesia Plan Comments: (Discussed both nerve block for pain relief post-op and GA; including NV, sore throat, dental injury, and pulmonary complications)         Anesthesia Quick Evaluation

## 2024-01-06 NOTE — Op Note (Signed)
 Date of Surgery: 01/06/2024  INDICATIONS: Mr. Quesnell is a 48 y.o.-year-old male with a right distal biceps rupture.  The patient did consent to the procedure after discussion of the risks and benefits.  PREOPERATIVE DIAGNOSIS: Right distal biceps rupture  POSTOPERATIVE DIAGNOSIS: Same.  PROCEDURE: Reinsertion right distal biceps tendon  SURGEON: N. Ozell Cummins, M.D.  ASSIST: Ronal Jacobsen [place], [place]; necessary for the timely completion of procedure and due to complexity of procedure.  ANESTHESIA:  general  IV FLUIDS AND URINE: See anesthesia.  ESTIMATED BLOOD LOSS: minimal mL.  IMPLANTS:  Implant Name Type Inv. Item Serial No. Manufacturer Lot No. LRB No. Used Action  SYSTEM ARTHRO FOR DISTAL BICEP - [id number] Orthopedic Implant SYSTEM ARTHRO FOR DISTAL BICEP  [employer] [id number] Right 1 Implanted    DRAINS: None  COMPLICATIONS: see description of procedure.  DESCRIPTION OF PROCEDURE: The patient was brought to the operating room.  The patient had been signed prior to the procedure and this was documented. The patient had the anesthesia placed by the anesthesiologist.  A time-out was performed to confirm that this was the correct patient, site, side and location. The patient did receive antibiotics prior to the incision and was re-dosed during the procedure as needed at indicated intervals.  The patient had the operative extremity prepped and draped in the standard surgical fashion.   A sterile tourniquet was placed on the upper arm and inflated to 250 mmHg. C-arm was brought in to localize the radial tuberosity and the skin incision was made directly in the interval between the brachioradialis and pronator teres.  Blunt dissection was carefully performed with tenotomy scissors.  Crossing vessels were cauterized and mobilized to side.  The forearm was placed into maximal supination.  The radial tuberosity was palpated and confirmed under fluoroscopy.  The supinator was elevated  off of the radial tuberosity with a Cobb.  We then extended the skin incision proximally in a curvilinear fashion across the elbow flexion crease.  Immature scar tissue was encountered.  This was entered sharply with tenotomy's and there was a traumatic seroma.  The distal biceps tendon was encased within this tissue.  The tendon was mobilized from the surrounding tissue.  It was still attached to the lacertus fibrosis.  Proximally I used my finger to mobilize the tendon from the surrounding tissue.  This gave excellent excursion to the tendon.  The distal 5 cm of the tendon was whipstitched.  The end of the tendon was freshened.  It was passed underneath the cephalic vein.  A guidepin was then advanced bicortically through the radial tuberosity using fluoroscopic guidance.  The tendon was approximately 8 mm in diameter.  An 8 mm reamer was then advanced across the near cortex.  The pin and the reamer were then removed and a flip button was attached to the sutures and advanced across the bone tunnel and flipped on the far side.  The elbow was then flexed and the tendon was delivered down into the bone tunnel.  Approximately a centimeter of it was placed in the tunnel.  I was very happy with the repair.  I then placed a 7 x 10 mm interference screw for backup fixation.  The button was confirmed under fluoroscopy to be appropriately flipped on the far cortex.  The surgical site was then thoroughly irrigated and closed with 0 Vicryl and a running 3-0 Monocryl.  Steri-Strips were applied.  Sterile dressings were placed.  Long-arm splint with the elbow at 90 degrees  was placed.  Patient tolerated procedure well had no any complications.  Morna Grave was necessary for opening, closing, retracting, limb positioning and overall facilitation and timely completion of the procedure.  POSTOPERATIVE PLAN: Patient will be discharged home and follow-up in a week for splint removal and initiation of range of motion.  GEANNIE Ozell Cummins, MD 2:10 PM

## 2024-01-07 ENCOUNTER — Encounter (HOSPITAL_BASED_OUTPATIENT_CLINIC_OR_DEPARTMENT_OTHER): Payer: Self-pay | Admitting: Orthopaedic Surgery

## 2024-01-08 ENCOUNTER — Other Ambulatory Visit

## 2024-01-12 ENCOUNTER — Telehealth: Payer: Self-pay

## 2024-01-12 NOTE — Telephone Encounter (Signed)
 Patient called triage phone stating that he was simply getting off his chair and felt pop in his arm again, states it feels almost the same as it did before surgery

## 2024-01-12 NOTE — Telephone Encounter (Signed)
 We'll check him tomorrow since he already has an appointment

## 2024-01-12 NOTE — Telephone Encounter (Signed)
 Patient aware of the below message from Dr. JERRI

## 2024-01-13 ENCOUNTER — Ambulatory Visit (INDEPENDENT_AMBULATORY_CARE_PROVIDER_SITE_OTHER): Payer: Self-pay

## 2024-01-13 ENCOUNTER — Ambulatory Visit
Admission: RE | Admit: 2024-01-13 | Discharge: 2024-01-13 | Disposition: A | Discharge status: Home / Self Care | Source: Ambulatory Visit | Attending: Orthopaedic Surgery | Admitting: Orthopaedic Surgery

## 2024-01-13 ENCOUNTER — Other Ambulatory Visit (HOSPITAL_COMMUNITY): Payer: Self-pay

## 2024-01-13 ENCOUNTER — Ambulatory Visit (INDEPENDENT_AMBULATORY_CARE_PROVIDER_SITE_OTHER): Admitting: Physician Assistant

## 2024-01-13 DIAGNOSIS — S46211A Strain of muscle, fascia and tendon of other parts of biceps, right arm, initial encounter: Secondary | ICD-10-CM

## 2024-01-13 MED ORDER — HYDROCODONE-ACETAMINOPHEN 5-325 MG PO TABS
1.0000 | ORAL_TABLET | Freq: Three times a day (TID) | ORAL | 0 refills | Status: DC | PRN
  Filled 2024-01-13: qty 21, 7d supply, fill #0

## 2024-01-13 NOTE — Progress Notes (Signed)
 Post-Op Visit Note   Patient: William Franco           Date of Birth: 29-Dec-1975           MRN: [id number] Visit Date: 01/13/2024 PCP: Donah Laymon PARAS, MD   Assessment & Plan:  Chief Complaint:  Chief Complaint  Patient presents with   Right Elbow - Pain, Routine Post Op   Visit Diagnoses:  1. Rupture of right distal biceps tendon, initial encounter     Plan: Patient is a pleasant 48 year old gentleman who comes in today 1 week status post right distal biceps repair, date of surgery 01/06/2024.  He has been compliant wearing a long-arm splint.  Yesterday, he stood up to stretch when he felt a pop to the right elbow.  This caused some pain that felt similar to when he initially ruptured his biceps tendon.  Examination of his right elbow reveals a bulge proximal to the incision.  He is neurovascularly intact distally.  At this point, I am concerned for rerupture of the distal biceps.  Will place him back in a long-arm splint and order an urgent MRI of the right elbow.  We will call him with the results.  Follow-Up Instructions: Return in about 1 week (around 01/20/2024).   Orders:  Orders Placed This Encounter  Procedures   XR Elbow 2 Views Right   MR Elbow Right w/o contrast   Meds ordered this encounter  Medications   HYDROcodone -acetaminophen  (NORCO/VICODIN) 5-325 MG tablet    Sig: Take 1 tablet by mouth 3 (three) times daily as needed.    Dispense:  21 tablet    Refill:  0    Imaging: No results found.  PMFS History: Patient Active Problem List   Diagnosis Date Noted   Rupture of distal biceps tendon, right, initial encounter 01/06/2024   Routine adult health maintenance 06/06/2023   Cluster headache, intractable 08/31/2021   Hyperlipidemia 04/02/2021   Pain in the abdomen 08/09/2019   Prediabetes 05/18/2019   Varicose vein of leg 10/19/2018   Seasonal allergies 10/19/2018   Gout 08/06/2016   Erectile dysfunction 04/19/2012   Hypertension 12/18/2010    Recurrent genital herpes 12/18/2010   Tobacco abuse 12/18/2010   Obesity 12/18/2010   Past Medical History:  Diagnosis Date   Asthma    Hypertension    Kidney stones    Pre-diabetes    Spinal stenosis, lumbar region, 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    Family History  Problem Relation Age of Onset   Hypertension Father    Hypertension Mother    Colon cancer Neg Hx    Colon polyps Neg Hx    Esophageal cancer Neg Hx    Rectal cancer Neg Hx    Stomach cancer Neg Hx     Past Surgical History:  Procedure Laterality Date   BACK SURGERY     DISTAL BICEPS TENDON REPAIR Right 01/06/2024   Procedure: REPAIR, TENDON, BICEPS, DISTAL;  Surgeon: Jerri Kay HERO, MD;  Location: [facility];  Service: Orthopedics;  Laterality: Right;   ENDOVENOUS ABLATION SAPHENOUS VEIN W/ LASER Left 06/23/2019   endovenous laser ablation left greater saphenous vein and stab phlebectomy 10-20 incisions left leg by Medford Blade MD    Social History   Occupational History   Not on file  Tobacco Use   Smoking status: Every Day    Types: Cigars   Smokeless tobacco: Never  Vaping Use   Vaping status: Never Used  Substance and Sexual  Activity   Alcohol use: Yes    Comment: occasionally   Drug use: No   Sexual activity: Not on file

## 2024-01-14 ENCOUNTER — Other Ambulatory Visit: Payer: Self-pay

## 2024-01-14 ENCOUNTER — Encounter: Payer: Self-pay | Admitting: Orthopaedic Surgery

## 2024-01-14 ENCOUNTER — Other Ambulatory Visit

## 2024-01-14 ENCOUNTER — Encounter (HOSPITAL_BASED_OUTPATIENT_CLINIC_OR_DEPARTMENT_OTHER): Payer: Self-pay | Admitting: Orthopaedic Surgery

## 2024-01-15 ENCOUNTER — Telehealth: Payer: Self-pay | Admitting: Orthopaedic Surgery

## 2024-01-15 NOTE — Telephone Encounter (Signed)
 Patient is scheduled 01-20-24 at [facility] at 7:30am for left distal biceps repair with Dr. Jerri.  Post op is 01-27-24.  Patient is requesting a note to give to employer for his estimated time out of work.  Please call patient at [phone] if any questions.

## 2024-01-18 NOTE — Telephone Encounter (Signed)
 Couple of weeks and then reevaluate

## 2024-01-18 NOTE — Telephone Encounter (Signed)
Yes -- for sure.  Thanks.

## 2024-01-18 NOTE — Telephone Encounter (Signed)
 Hey...SABRAplease advise on estimated time out of work.

## 2024-01-18 NOTE — Telephone Encounter (Signed)
 Note made. Called patient. He would like me to send Via Mychart. Note sent.

## 2024-01-19 ENCOUNTER — Telehealth: Payer: Self-pay | Admitting: Orthopaedic Surgery

## 2024-01-19 ENCOUNTER — Telehealth: Payer: Self-pay

## 2024-01-19 ENCOUNTER — Other Ambulatory Visit: Payer: Self-pay | Admitting: Physician Assistant

## 2024-01-19 ENCOUNTER — Other Ambulatory Visit (HOSPITAL_COMMUNITY): Payer: Self-pay

## 2024-01-19 MED ORDER — HYDROCODONE-ACETAMINOPHEN 5-325 MG PO TABS
1.0000 | ORAL_TABLET | Freq: Three times a day (TID) | ORAL | 0 refills | Status: AC | PRN
  Filled 2024-01-19 – 2024-02-07 (×2): qty 21, 7d supply, fill #0

## 2024-01-19 NOTE — Telephone Encounter (Signed)
 Received call from patient stating [employer] has not received his form. I advised patient that it faxed on 01/07/24. I re-faxed form and emailed patient a copy to him per his request at [email or web address]

## 2024-01-19 NOTE — Telephone Encounter (Signed)
 Patient would like to know if work note can be emailed to [email or web address].  Cb# [phone].  Please advise.  Thank you.

## 2024-01-20 ENCOUNTER — Ambulatory Visit (HOSPITAL_BASED_OUTPATIENT_CLINIC_OR_DEPARTMENT_OTHER): Admitting: Anesthesiology

## 2024-01-20 ENCOUNTER — Ambulatory Visit (HOSPITAL_BASED_OUTPATIENT_CLINIC_OR_DEPARTMENT_OTHER)
Admission: RE | Admit: 2024-01-20 | Discharge: 2024-01-20 | Disposition: A | Discharge status: Home / Self Care | Attending: Orthopaedic Surgery | Admitting: Orthopaedic Surgery

## 2024-01-20 ENCOUNTER — Ambulatory Visit (HOSPITAL_COMMUNITY)

## 2024-01-20 ENCOUNTER — Encounter (HOSPITAL_BASED_OUTPATIENT_CLINIC_OR_DEPARTMENT_OTHER): Payer: Self-pay | Admitting: Orthopaedic Surgery

## 2024-01-20 ENCOUNTER — Encounter (HOSPITAL_BASED_OUTPATIENT_CLINIC_OR_DEPARTMENT_OTHER)
Admission: RE | Disposition: A | Discharge status: Home / Self Care | Payer: Self-pay | Source: Home / Self Care | Attending: Orthopaedic Surgery

## 2024-01-20 ENCOUNTER — Other Ambulatory Visit: Payer: Self-pay

## 2024-01-20 DIAGNOSIS — F17290 Nicotine dependence, other tobacco product, uncomplicated: Secondary | ICD-10-CM | POA: Insufficient documentation

## 2024-01-20 DIAGNOSIS — Y838 Other surgical procedures as the cause of abnormal reaction of the patient, or of later complication, without mention of misadventure at the time of the procedure: Secondary | ICD-10-CM | POA: Insufficient documentation

## 2024-01-20 DIAGNOSIS — I10 Essential (primary) hypertension: Secondary | ICD-10-CM | POA: Diagnosis not present

## 2024-01-20 DIAGNOSIS — S46211A Strain of muscle, fascia and tendon of other parts of biceps, right arm, initial encounter: Secondary | ICD-10-CM | POA: Diagnosis not present

## 2024-01-20 DIAGNOSIS — Y793 Surgical instruments, materials and orthopedic devices (including sutures) associated with adverse incidents: Secondary | ICD-10-CM | POA: Diagnosis not present

## 2024-01-20 DIAGNOSIS — T84112A Breakdown (mechanical) of internal fixation device of bone of right forearm, initial encounter: Secondary | ICD-10-CM | POA: Insufficient documentation

## 2024-01-20 DIAGNOSIS — S46212D Strain of muscle, fascia and tendon of other parts of biceps, left arm, subsequent encounter: Secondary | ICD-10-CM | POA: Diagnosis present

## 2024-01-20 DIAGNOSIS — R7303 Prediabetes: Secondary | ICD-10-CM

## 2024-01-20 HISTORY — PX: DISTAL BICEPS TENDON REPAIR: SHX1461

## 2024-01-20 LAB — GLUCOSE, CAPILLARY
Glucose-Capillary: 112 mg/dL — ABNORMAL HIGH (ref 70–99)
Glucose-Capillary: 111 mg/dL — ABNORMAL HIGH (ref 70–99)

## 2024-01-20 SURGERY — REPAIR, TENDON, BICEPS, DISTAL
Anesthesia: General | Site: Arm Upper | Laterality: Left

## 2024-01-20 MED ORDER — OXYCODONE HCL 5 MG/5ML PO SOLN: 5.0000 mg | Freq: Once | ORAL | Status: DC | PRN

## 2024-01-20 MED ORDER — LACTATED RINGERS IV SOLN: INTRAVENOUS | Status: DC

## 2024-01-20 MED ORDER — AMISULPRIDE (ANTIEMETIC) 5 MG/2ML IV SOLN: 10.0000 mg | Freq: Once | INTRAVENOUS | Status: DC | PRN

## 2024-01-20 MED ORDER — FENTANYL CITRATE (PF) 100 MCG/2ML IJ SOLN
INTRAMUSCULAR | Status: AC
  Filled 2024-01-20: qty 2

## 2024-01-20 MED ORDER — CEFAZOLIN SODIUM-DEXTROSE 3-4 GM/150ML-% IV SOLN
3.0000 g | INTRAVENOUS | Status: AC
  Administered 2024-01-20: 3 g via INTRAVENOUS

## 2024-01-20 MED ORDER — MIDAZOLAM HCL 2 MG/2ML IJ SOLN
INTRAMUSCULAR | Status: AC
  Filled 2024-01-20: qty 2

## 2024-01-20 MED ORDER — FENTANYL CITRATE (PF) 100 MCG/2ML IJ SOLN
100.0000 ug | Freq: Once | INTRAMUSCULAR | Status: AC
  Administered 2024-01-20: 100 ug via INTRAVENOUS

## 2024-01-20 MED ORDER — ONDANSETRON HCL 4 MG/2ML IJ SOLN
INTRAMUSCULAR | Status: DC | PRN
  Administered 2024-01-20: 4 mg via INTRAVENOUS

## 2024-01-20 MED ORDER — ROPIVACAINE HCL 5 MG/ML IJ SOLN
INTRAMUSCULAR | Status: DC | PRN
  Administered 2024-01-20: 30 mL via PERINEURAL

## 2024-01-20 MED ORDER — 0.9 % SODIUM CHLORIDE (POUR BTL) OPTIME
TOPICAL | Status: DC | PRN
  Administered 2024-01-20: 500 mL

## 2024-01-20 MED ORDER — MIDAZOLAM HCL 2 MG/2ML IJ SOLN
2.0000 mg | Freq: Once | INTRAMUSCULAR | Status: AC
  Administered 2024-01-20: 2 mg via INTRAVENOUS

## 2024-01-20 MED ORDER — HYDROMORPHONE HCL 1 MG/ML IJ SOLN: 0.2500 mg | INTRAMUSCULAR | Status: DC | PRN

## 2024-01-20 MED ORDER — FENTANYL CITRATE (PF) 100 MCG/2ML IJ SOLN
INTRAMUSCULAR | Status: DC | PRN
  Administered 2024-01-20: 50 ug via INTRAVENOUS

## 2024-01-20 MED ORDER — DEXAMETHASONE SODIUM PHOSPHATE 10 MG/ML IJ SOLN
INTRAMUSCULAR | Status: DC | PRN
  Administered 2024-01-20: 10 mg via INTRAVENOUS

## 2024-01-20 MED ORDER — PROPOFOL 10 MG/ML IV BOLUS
INTRAVENOUS | Status: DC | PRN
Start: 2024-01-20 — End: 2024-01-20
  Administered 2024-01-20: 100 ug via INTRAVENOUS
  Administered 2024-01-20: 200 ug via INTRAVENOUS

## 2024-01-20 MED ORDER — LIDOCAINE 2% (20 MG/ML) 5 ML SYRINGE
INTRAMUSCULAR | Status: DC | PRN
  Administered 2024-01-20: 40 mg via INTRAVENOUS

## 2024-01-20 MED ORDER — OXYCODONE HCL 5 MG PO TABS: 5.0000 mg | ORAL_TABLET | Freq: Once | ORAL | Status: DC | PRN

## 2024-01-20 MED ORDER — SODIUM CHLORIDE 0.9 % IV SOLN
12.5000 mg | INTRAVENOUS | Status: DC | PRN
  Filled 2024-01-20: qty 0.5

## 2024-01-20 MED ORDER — CEFAZOLIN SODIUM-DEXTROSE 3-4 GM/150ML-% IV SOLN
INTRAVENOUS | Status: AC
  Filled 2024-01-20: qty 150

## 2024-01-20 MED ORDER — DEXMEDETOMIDINE HCL IN NACL 80 MCG/20ML IV SOLN
INTRAVENOUS | Status: DC | PRN
  Administered 2024-01-20 (×3): 4 ug via INTRAVENOUS

## 2024-01-20 SURGICAL SUPPLY — 61 items
BLADE SURG 15 STRL LF DISP TIS (BLADE) ×2 IMPLANT
BNDG COHESIVE 4X5 TAN STRL LF (GAUZE/BANDAGES/DRESSINGS) ×1 IMPLANT
BNDG COMPR ESMARK 4X3 LF (GAUZE/BANDAGES/DRESSINGS) ×1 IMPLANT
BNDG ELASTIC 4INX 5YD STR LF (GAUZE/BANDAGES/DRESSINGS) ×2 IMPLANT
BNDG ELASTIC 6INX 5YD STR LF (GAUZE/BANDAGES/DRESSINGS) ×1 IMPLANT
CORD BIPOLAR FORCEPS 12FT (ELECTRODE) ×1 IMPLANT
COVER BACK TABLE 60X90IN (DRAPES) ×1 IMPLANT
CUFF TOURN SGL QUICK 18X3 (MISCELLANEOUS) ×1 IMPLANT
CUFF TOURN SGL QUICK 18X4 (TOURNIQUET CUFF) IMPLANT
CUFF TRNQT CYL 24X4X16.5-23 (TOURNIQUET CUFF) IMPLANT
DERMABOND ADVANCED .7 DNX12 (GAUZE/BANDAGES/DRESSINGS) IMPLANT
DRAPE C-ARM 42X72 X-RAY (DRAPES) ×1 IMPLANT
DRAPE EXTREMITY T 121X128X90 (DISPOSABLE) ×1 IMPLANT
DRAPE IMP U-DRAPE 54X76 (DRAPES) ×1 IMPLANT
DRAPE SURG 17X23 STRL (DRAPES) ×1 IMPLANT
DURAPREP 26ML APPLICATOR (WOUND CARE) ×1 IMPLANT
ELECTRODE REM PT RTRN 9FT ADLT (ELECTROSURGICAL) ×1 IMPLANT
EXTENSION HOSE W/PLC CONNECTON (MISCELLANEOUS) IMPLANT
GAUZE PAD ABD 8X10 STRL (GAUZE/BANDAGES/DRESSINGS) IMPLANT
GAUZE SPONGE 4X4 12PLY STRL (GAUZE/BANDAGES/DRESSINGS) ×1 IMPLANT
GAUZE XEROFORM 1X8 LF (GAUZE/BANDAGES/DRESSINGS) IMPLANT
GLOVE BIOGEL PI IND STRL 7.5 (GLOVE) ×1 IMPLANT
GLOVE ECLIPSE 7.0 STRL STRAW (GLOVE) ×2 IMPLANT
GLOVE INDICATOR 7.0 STRL GRN (GLOVE) ×1 IMPLANT
GLOVE SURG SYN 7.5 PF PI (GLOVE) ×2 IMPLANT
GOWN STRL REUS W/ TWL LRG LVL3 (GOWN DISPOSABLE) ×1 IMPLANT
GOWN STRL REUS W/ TWL XL LVL3 (GOWN DISPOSABLE) ×1 IMPLANT
GOWN STRL SURGICAL XL XLNG (GOWN DISPOSABLE) ×1 IMPLANT
INSERTER BUTTON (SYSTAGENIX WOUND MANAGEMENT) IMPLANT
KIT INSRT BABSR STRL DISP BTN (MISCELLANEOUS) IMPLANT
NDL PRECISIONGLIDE 27X1.5 (NEEDLE) IMPLANT
NDL SUT 6 .5 CRC .975X.05 MAYO (NEEDLE) IMPLANT
NEEDLE PRECISIONGLIDE 27X1.5 (NEEDLE) IMPLANT
NS IRRIG 1000ML POUR BTL (IV SOLUTION) ×1 IMPLANT
PACK BASIN DAY SURGERY FS (CUSTOM PROCEDURE TRAY) ×1 IMPLANT
PAD CAST 4YDX4 CTTN HI CHSV (CAST SUPPLIES) ×2 IMPLANT
PADDING CAST ABS COTTON 4X4 ST (CAST SUPPLIES) IMPLANT
PADDING CAST SYNTHETIC 4X4 STR (CAST SUPPLIES) IMPLANT
PENCIL SMOKE EVACUATOR (MISCELLANEOUS) ×1 IMPLANT
PIN DRILL ACL TIGHTROPE 4MM (PIN) IMPLANT
SHEET MEDIUM DRAPE 40X70 STRL (DRAPES) ×1 IMPLANT
SLEEVE SCD COMPRESS KNEE MED (STOCKING) ×1 IMPLANT
SPIKE FLUID TRANSFER (MISCELLANEOUS) IMPLANT
SPLINT FIBERGLASS 3X35 (CAST SUPPLIES) IMPLANT
SPLINT FIBERGLASS 4X30 (CAST SUPPLIES) IMPLANT
SPONGE T-LAP 18X18 ~~LOC~~+RFID (SPONGE) ×1 IMPLANT
STOCKINETTE IMPERVIOUS LG (DRAPES) ×1 IMPLANT
SUCTION TUBE FRAZIER 12FR DISP (SUCTIONS) ×1 IMPLANT
SUT ETHILON 3 0 PS 1 (SUTURE) IMPLANT
SUT MNCRL AB 4-0 PS2 18 (SUTURE) IMPLANT
SUT SILK 0 TIES 10X30 (SUTURE) IMPLANT
SUT VIC AB 2-0 CT1 TAPERPNT 27 (SUTURE) IMPLANT
SUT VIC AB 2-0 SH 27XBRD (SUTURE) IMPLANT
SUT VIC AB 3-0 PS1 18XBRD (SUTURE) IMPLANT
SUTURE FIBERWR #2 38 T-5 BLUE (SUTURE) IMPLANT
SUTURE TAPE 1.3 FIBERLOP 20 ST (SUTURE) ×1 IMPLANT
SYR BULB EAR ULCER 3OZ GRN STR (SYRINGE) ×1 IMPLANT
SYR CONTROL 10ML LL (SYRINGE) IMPLANT
TOWEL GREEN STERILE FF (TOWEL DISPOSABLE) ×2 IMPLANT
TUBE CONNECTING 20X1/4 (TUBING) ×1 IMPLANT
UNDERPAD 30X36 HEAVY ABSORB (UNDERPADS AND DIAPERS) ×1 IMPLANT

## 2024-01-20 NOTE — Anesthesia Preprocedure Evaluation (Addendum)
 Anesthesia Evaluation  Patient identified by MRN, date of birth, ID band Patient awake    Reviewed: Allergy & Precautions, H&P , NPO status , Patient's Chart, lab work & pertinent test results  Airway Mallampati: II  TM Distance: >3 FB Neck ROM: Full    Dental no notable dental hx. (+) Teeth Intact, Dental Advisory Given, Missing   Pulmonary neg pulmonary ROS, asthma , Current Smoker and Patient abstained from smoking.   Pulmonary exam normal breath sounds clear to auscultation       Cardiovascular hypertension, negative cardio ROS Normal cardiovascular exam Rhythm:Regular Rate:Normal     Neuro/Psych  Headaches negative neurological ROS  negative psych ROS   GI/Hepatic negative GI ROS, Neg liver ROS,,,  Endo/Other  negative endocrine ROS    Renal/GU Renal diseasenegative Renal ROS  negative genitourinary   Musculoskeletal negative musculoskeletal ROS (+)    Abdominal  (+) + obese  Peds negative pediatric ROS (+)  Hematology negative hematology ROS (+)   Anesthesia Other Findings   Reproductive/Obstetrics negative OB ROS                              Anesthesia Physical Anesthesia Plan  ASA: 3  Anesthesia Plan: General   Post-op Pain Management: Minimal or no pain anticipated and Regional block*   Induction: Intravenous  PONV Risk Score and Plan: 1 and Ondansetron  and Treatment may vary due to age or medical condition  Airway Management Planned: LMA  Additional Equipment: None  Intra-op Plan:   Post-operative Plan: Extubation in OR  Informed Consent: I have reviewed the patients History and Physical, chart, labs and discussed the procedure including the risks, benefits and alternatives for the proposed anesthesia with the patient or authorized representative who has indicated his/her understanding and acceptance.     Dental advisory given  Plan Discussed with:  Anesthesiologist and CRNA  Anesthesia Plan Comments: (Discussed both nerve block for pain relief post-op and GA; including NV, sore throat, dental injury, and pulmonary complications)         Anesthesia Quick Evaluation

## 2024-01-20 NOTE — Transfer of Care (Signed)
 Immediate Anesthesia Transfer of Care Note  Patient: William Franco  Procedure(s) Performed: REPAIR, TENDON, BICEPS, DISTAL (Left: Arm Upper)  Patient Location: PACU  Anesthesia Type:General  Level of Consciousness: drowsy and patient cooperative  Airway & Oxygen Therapy: Patient Spontanous Breathing and Patient connected to face mask oxygen  Post-op Assessment: Report given to RN and Post -op Vital signs reviewed and stable  Post vital signs: Reviewed and stable  Last Vitals:  Vitals Value Taken Time  BP 138/89 01/20/24 09:13  Temp    Pulse 66 01/20/24 09:22  Resp 17 01/20/24 09:22  SpO2 94 % 01/20/24 09:22  Vitals shown include unfiled device data.  Last Pain:  Vitals:   01/20/24 0727  TempSrc:   PainSc: 0-No pain      Patients Stated Pain Goal: 4 (01/20/24 9367)  Complications: No notable events documented.

## 2024-01-20 NOTE — Op Note (Signed)
 Date of Surgery: 01/20/2024  INDICATIONS: William Franco is a 48 y.o.-year-old male who underwent right distal biceps repair 2 weeks ago.  Unfortunately during the first week after surgery he experienced a pop in his elbow and subsequent imaging revealed failure of fixation.  Based on the findings revision surgery was recommended.  patient did consent to the procedure after discussion of the risks and benefits.  PREOPERATIVE DIAGNOSIS: Failure of repair of right distal biceps tendon  POSTOPERATIVE DIAGNOSIS: Same.  PROCEDURE: Revision repair of right distal biceps tendon  SURGEON: N. Ozell Franco, M.D.  ASSIST: Ronal Jacobsen [place], [place]; necessary for the timely completion of procedure and due to complexity of procedure.  ANESTHESIA:  general, Block  IV FLUIDS AND URINE: See anesthesia.  ESTIMATED BLOOD LOSS: Minimal mL.  IMPLANTS:  Implant Name Type Inv. Item Serial No. Manufacturer Lot No. LRB No. Used Action  BICEPSBUTTON 2.6x59mm    [employer] [id number] Left 1 Implanted    DRAINS: None  COMPLICATIONS: see description of procedure.  DESCRIPTION OF PROCEDURE: The patient was brought to the operating room.  The patient had been signed prior to the procedure and this was documented. The patient had the anesthesia placed by the anesthesiologist.  A time-out was performed to confirm that this was the correct patient, site, side and location. The patient did receive antibiotics prior to the incision and was re-dosed during the procedure as needed at indicated intervals.  A tourniquet was placed on the upper arm.  The patient had the operative extremity prepped and draped in the standard surgical fashion.    The tourniquet was inflated to 250 mmHg.  The previous surgical incision was incised.  I used my finger to bluntly find the previous surgical interval between the brachial radialis and the pronator teres.  Traumatic seroma was encountered.  The end of the distal biceps tendon with the  suture was directly visualized.  The suture had become unraveled from the flip button.  The suture was removed and the tendon was freshened up with tenotomy scissors.  This was rewhipstitched.  I then carefully continued my blunt dissection down onto the radial shaft.  The forearm was placed into maximum supination.  Retractors were placed.  The interference screw was in the bone tunnel.  The bone tunnel was intact.  There is no evidence of fracture.  The previous flip button was not retrievable through the volar incision.  In order to retrieve the previous button I would have to make a second incision which would increase the risk of radial ulnar synostosis and morbidity.  I made the decision to leave it in the soft tissues as I do not feel that this would cause any problems.  A new flip button was attached to the whipstitch and delivered through the bone tunnel and flipped on the far cortex which was confirmed under fluoroscopy.  A centimeter of the distal biceps tendon was delivered into the bone tunnel by pulling on the whipstitch.  I flexed the elbow to help with that.  The ends of the whipstitch were then passed back through the tendon with a free needle in order to lock the suture.  This was then tied down.  1 end of the suture was then passed through the interference screw.  The interference screw was placed in the bone tunnel with excellent purchase.  The sutures were tied and then cut short.  The surgical site was thoroughly irrigated and closed in a layered fashion using 2-0 Vicryl  and 3-0 nylon.  Sterile dressings were applied.  A long-arm splint was placed.  Patient tolerated procedure well had no immediate complications.  William Franco was necessary for opening, closing, retracting, limb positioning and overall facilitation and timely completion of the procedure.  POSTOPERATIVE PLAN: Patient will be discharged home and follow-up in 1 week for splint and wound recheck.  William Ozell Cummins, MD 8:42  AM

## 2024-01-20 NOTE — H&P (Signed)
 PREOPERATIVE H&P  Chief Complaint: Right distal biceps rupture  HPI: William Franco is a 48 y.o. male who presents for surgical treatment of right distal biceps rupture.  He denies any changes in medical history.  Past Surgical History:  Procedure Laterality Date   BACK SURGERY     DISTAL BICEPS TENDON REPAIR Right 01/06/2024   Procedure: REPAIR, TENDON, BICEPS, DISTAL;  Surgeon: Jerri Kay HERO, MD;  Location: [facility];  Service: Orthopedics;  Laterality: Right;   ENDOVENOUS ABLATION SAPHENOUS VEIN W/ LASER Left 06/23/2019   endovenous laser ablation left greater saphenous vein and stab phlebectomy 10-20 incisions left leg by Medford Blade MD    Social History   Socioeconomic History   Marital status: Married    Spouse name: Not on file   Number of children: Not on file   Years of education: Not on file   Highest education level: Not on file  Occupational History   Not on file  Tobacco Use   Smoking status: Every Day    Types: Cigars   Smokeless tobacco: Never  Vaping Use   Vaping status: Never Used  Substance and Sexual Activity   Alcohol use: Yes    Comment: occasionally   Drug use: No   Sexual activity: Not on file  Other Topics Concern   Not on file  Social History Narrative   Divorced with no children. 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  Family in [place]         Social Drivers of Health   Financial Resource Strain: Not on file  Food Insecurity: Not on file  Transportation Needs: Not on file  Physical Activity: Not on file  Stress: Not on file  Social Connections: Not on file   Family History  Problem Relation Age of Onset   Hypertension Father    Hypertension Mother    Colon cancer Neg Hx    Colon polyps Neg Hx    Esophageal cancer Neg Hx    Rectal cancer Neg Hx    Stomach cancer Neg Hx    Allergies  Allergen Reactions   Bee Venom Swelling   Prior to Admission medications   Medication Sig Start Date End Date  Taking? Authorizing Provider  amLODipine  (NORVASC ) 10 MG tablet Take 1 tablet (10 mg total) by mouth daily. 08/03/23  Yes Donah Laymon PARAS, MD  losartan  (COZAAR ) 100 MG tablet Take 1 tablet (100 mg total) by mouth daily. 08/03/23  Yes Donah Laymon PARAS, MD  metFORMIN  (GLUCOPHAGE -XR) 500 MG 24 hr tablet Take 1 tablet (500 mg total) by mouth daily with breakfast. 11/09/23  Yes Donah Laymon PARAS, MD  rosuvastatin  (CRESTOR ) 10 MG tablet Take 1 tablet (10 mg total) by mouth daily. 11/09/23  Yes Donah Laymon PARAS, MD  celecoxib  (CELEBREX ) 200 MG capsule Take 1 capsule (200 mg total) by mouth 2 (two) times daily as needed. Patient not taking: Reported on 01/01/2024 12/11/23   Silver Fell A, PA  fluticasone  (FLONASE ) 50 MCG/ACT nasal spray Place 1 spray into both nostrils daily as needed for allergies. 06/04/23   Donah Laymon PARAS, MD  hydrochlorothiazide  (HYDRODIURIL ) 25 MG tablet Take 1 tablet (25 mg total) by mouth daily. 08/03/23   Donah Laymon PARAS, MD  HYDROcodone -acetaminophen  (NORCO/VICODIN) 5-325 MG tablet Take 1 tablet by mouth 3 (three) times daily as needed. 01/19/24   Jule Ronal CROME, PA-C  methocarbamol  (ROBAXIN ) 500 MG tablet Take 1 tablet (500 mg total) by mouth 2 (  two) times daily as needed. 01/04/24   Jule Ronal CROME, PA-C  Multiple Vitamin (MULTIVITAMIN WITH MINERALS) TABS tablet Take 1 tablet by mouth daily.    [provider]  ondansetron  (ZOFRAN ) 4 MG tablet Take 1 tablet (4 mg total) by mouth every 8 (eight) hours as needed for nausea or vomiting. 01/04/24   Jule Ronal CROME, PA-C  sildenafil  (VIAGRA ) 100 MG tablet Take 1 tablet (100 mg total) by mouth as needed. 08/03/23   Donah Laymon PARAS, MD  valACYclovir  (VALTREX ) 500 MG tablet Take 1 tablet (500 mg total) by mouth daily as needed 04/02/21   Jeanelle Layman CROME, MD     Positive ROS: All other systems have been reviewed and were otherwise negative with the exception of those mentioned in the HPI and as  above.  Physical Exam: General: Alert, no acute distress Cardiovascular: No pedal edema Respiratory: No cyanosis, no use of accessory musculature GI: abdomen soft Skin: No lesions in the area of chief complaint Neurologic: Sensation intact distally Psychiatric: Patient is competent for consent with normal mood and affect Lymphatic: no lymphedema  MUSCULOSKELETAL: exam stable  Assessment: Right distal biceps rupture  Plan: Plan for Procedure(s): REPAIR, TENDON, BICEPS, DISTAL  The risks benefits and alternatives were discussed with the patient including but not limited to the risks of nonoperative treatment, versus surgical intervention including infection, bleeding, nerve injury,  blood clots, cardiopulmonary complications, morbidity, mortality, among others, and they were willing to proceed.   Ozell Cummins, MD 01/20/2024 7:19 AM

## 2024-01-20 NOTE — Anesthesia Postprocedure Evaluation (Signed)
 Anesthesia Post Note  Patient: William Franco  Procedure(s) Performed: REPAIR, TENDON, BICEPS, DISTAL (Left: Arm Upper)     Patient location during evaluation: PACU Anesthesia Type: General Level of consciousness: awake and alert Pain management: pain level controlled Vital Signs Assessment: post-procedure vital signs reviewed and stable Respiratory status: spontaneous breathing, nonlabored ventilation and respiratory function stable Cardiovascular status: blood pressure returned to baseline and stable Postop Assessment: no apparent nausea or vomiting Anesthetic complications: no   No notable events documented.  Last Vitals:  Vitals:   01/20/24 0930 01/20/24 0941  BP: 121/75 122/77  Pulse: 67   Resp: 12   Temp:    SpO2: 96%     Last Pain:  Vitals:   01/20/24 0930  TempSrc:   PainSc: 0-No pain                 Butler Levander Pinal

## 2024-01-20 NOTE — Telephone Encounter (Signed)
 We need to make sure we put restrictions to include no lifting with operative extremity

## 2024-01-20 NOTE — Anesthesia Procedure Notes (Signed)
 Procedure Name: LMA Insertion Date/Time: 01/20/2024 7:47 AM  Performed by: Leotha Andrez DEL, CRNAPre-anesthesia Checklist: Patient identified, Emergency Drugs available, Suction available, Patient being monitored and Timeout performed Patient Re-evaluated:Patient Re-evaluated prior to induction Oxygen Delivery Method: Circle system utilized Preoxygenation: Pre-oxygenation with 100% oxygen Induction Type: IV induction Ventilation: Mask ventilation without difficulty LMA: LMA inserted LMA Size: 5.0 Number of attempts: 1 Placement Confirmation: breath sounds checked- equal and bilateral and positive ETCO2 Tube secured with: Tape Dental Injury: Teeth and Oropharynx as per pre-operative assessment

## 2024-01-20 NOTE — Discharge Instructions (Addendum)
 Postoperative instructions:  Weightbearing instructions: non weight bearing  Dressing instructions: Keep your dressing and/or splint clean and dry at all times.  It will be removed at your first post-operative appointment.  Your stitches and/or staples will be removed at this visit.  Incision instructions:  Do not soak your incision for 3 weeks after surgery.  If the incision gets wet, pat dry and do not scrub the incision.  Pain control:  You have been given a prescription to be taken as directed for post-operative pain control.  In addition, elevate the operative extremity above the heart at all times to prevent swelling and throbbing pain.  Take over-the-counter Colace, 100mg  by mouth twice a day while taking narcotic pain medications to help prevent constipation.  Follow up appointments: 1) 7 days for suture removal and wound check. 2) Dr. Jerri as scheduled.   -------------------------------------------------------------------------------------------------------------  After Surgery Pain Control:  After your surgery, post-surgical discomfort or pain is likely. This discomfort can last several days to a few weeks. At certain times of the day your discomfort may be more intense.  Did you receive a nerve block?  A nerve block can provide pain relief for one hour to two days after your surgery. As long as the nerve block is working, you will experience little or no sensation in the area the surgeon operated on.  As the nerve block wears off, you will begin to experience pain or discomfort. It is very important that you begin taking your prescribed pain medication before the nerve block fully wears off. Treating your pain at the first sign of the block wearing off will ensure your pain is better controlled and more tolerable when full-sensation returns. Do not wait until the pain is intolerable, as the medicine will be less effective. It is better to treat pain in advance than to try and  catch up.  General Anesthesia:  If you did not receive a nerve block during your surgery, you will need to start taking your pain medication shortly after your surgery and should continue to do so as prescribed by your surgeon.  Pain Medication:  Most commonly we prescribe Vicodin and Percocet for post-operative pain. Both of these medications contain a combination of acetaminophen  (Tylenol ) and a narcotic to help control pain.   It takes between 30 and 45 minutes before pain medication starts to work. It is important to take your medication before your pain level gets too intense.   Nausea is a common side effect of many pain medications. You will want to eat something before taking your pain medicine to help prevent nausea.   If you are taking a prescription pain medication that contains acetaminophen , we recommend that you do not take additional over the counter acetaminophen  (Tylenol ).  Other pain relieving options:   Using a cold pack to ice the affected area a few times a day (15 to 20 minutes at a time) can help to relieve pain, reduce swelling and bruising.   Elevation of the affected area can also help to reduce pain and swelling.  Per [facility] clinic policy, our goal is ensure optimal postoperative pain control with a multimodal pain management strategy. For all [facility] patients, our goal is to wean post-operative narcotic medications by 6 weeks post-operatively. If this is not possible due to utilization of pain medication prior to surgery, your [facility] doctor will support your acute post-operative pain control for the first 6 weeks postoperatively, with a plan to transition you back to your  primary pain team following that. Mar[facility]e will work to ensure a Therapist, occupational.   Regional Anesthesia Blocks  1. You may not be able to move or feel the blocked extremity after a regional anesthetic block. This may last may last from 3-48 hours after placement, but it will go away. The  length of time depends on the medication injected and your individual response to the medication. As the nerves start to wake up, you may experience tingling as the movement and feeling returns to your extremity. If the numbness and inability to move your extremity has not gone away after 48 hours, please call your surgeon.   2. The extremity that is blocked will need to be protected until the numbness is gone and the strength has returned. Because you cannot feel it, you will need to take extra care to avoid injury. Because it may be weak, you may have difficulty moving it or using it. You may not know what position it is in without looking at it while the block is in effect.  3. For blocks in the legs and feet, returning to weight bearing and walking needs to be done carefully. You will need to wait until the numbness is entirely gone and the strength has returned. You should be able to move your leg and foot normally before you try and bear weight or walk. You will need someone to be with you when you first try to ensure you do not fall and possibly risk injury.  4. Bruising and tenderness at the needle site are common side effects and will resolve in a few days.  5. Persistent numbness or new problems with movement should be communicated to the surgeon or the [facility] [phone] [facility] [phone]).

## 2024-01-20 NOTE — Anesthesia Procedure Notes (Signed)
 Anesthesia Regional Block: Supraclavicular block   Pre-Anesthetic Checklist: , timeout performed,  Correct Patient, Correct Site, Correct Laterality,  Correct Procedure, Correct Position, site marked,  Risks and benefits discussed,  Surgical consent,  Pre-op evaluation,  At surgeon's request and post-op pain management  Laterality: Right  Prep: chloraprep       Needles:  Injection technique: Single-shot  Needle Type: Stimiplex     Needle Length: 9cm  Needle Gauge: 21     Additional Needles:   Procedures:,,,, ultrasound used (permanent image in chart),,    Narrative:  Start time: 01/20/2024 7:30 AM End time: 01/20/2024 7:35 AM Injection made incrementally with aspirations every 5 mL.  Performed by: Personally  Anesthesiologist: Cleotilde Butler Dade, MD

## 2024-01-20 NOTE — Progress Notes (Signed)
 Assisted Dr. Hyacinth Meeker with right, supraclavicular, ultrasound guided block. Side rails up, monitors on throughout procedure. See vital signs in flow sheet. Tolerated Procedure well.

## 2024-01-21 ENCOUNTER — Encounter (HOSPITAL_BASED_OUTPATIENT_CLINIC_OR_DEPARTMENT_OTHER): Payer: Self-pay | Admitting: Orthopaedic Surgery

## 2024-01-27 ENCOUNTER — Ambulatory Visit: Admitting: Physician Assistant

## 2024-01-27 DIAGNOSIS — S46211A Strain of muscle, fascia and tendon of other parts of biceps, right arm, initial encounter: Secondary | ICD-10-CM

## 2024-01-27 NOTE — Progress Notes (Signed)
 Post-Op Visit Note   Patient: William Franco           Date of Birth: [birth year]           MRN: [id number] Visit Date: 01/27/2024 PCP: Donah Laymon PARAS, MD   Assessment & Plan:  Chief Complaint: No chief complaint on file.  Visit Diagnoses:  1. Rupture of distal biceps tendon, right, initial encounter     Plan: Patient is a pleasant 48 year old gentleman who comes in today 1 week status post right distal biceps rerepair, date of surgery 01/20/2024.  He has been doing well.  He has occasional pain which is relieved with Norco.  Examination of the right elbow reveals a well healing surgical incision with nylon sutures in place.  No evidence of infection or cellulitis.  Fingers warm well-perfused.  He is neurovascular intact distally.  Today, his incision was cleaned and recovered. We will go ahead and start him in outpatient physical therapy to work on range of motion of the shoulder, elbow, wrist.  He will remain nonweightbearing until he is 6 weeks postop.  Follow-up next week for suture removal.  Call with concerns or questions.  Follow-Up Instructions: Return in about 1 week (around 02/03/2024).   Orders:  Orders Placed This Encounter  Procedures   Ambulatory referral to Physical Therapy   No orders of the defined types were placed in this encounter.   Imaging: No new imaging  PMFS History: Patient Active Problem List   Diagnosis Date Noted   Rupture of distal biceps tendon, right, initial encounter 01/06/2024   Routine adult health maintenance 06/06/2023   Cluster headache, intractable 08/31/2021   Hyperlipidemia 04/02/2021   Pain in the abdomen 08/09/2019   Prediabetes 05/18/2019   Varicose vein of leg 10/19/2018   Seasonal allergies 10/19/2018   Gout 08/06/2016   Erectile dysfunction 04/19/2012   Hypertension 12/18/2010   Recurrent genital herpes 12/18/2010   Tobacco abuse 12/18/2010   Obesity 12/18/2010   Past Medical History:  Diagnosis Date    Asthma    Hypertension    Kidney stones    Pre-diabetes    Spinal stenosis, lumbar region, 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    Family History  Problem Relation Age of Onset   Hypertension Father    Hypertension Mother    Colon cancer Neg Hx    Colon polyps Neg Hx    Esophageal cancer Neg Hx    Rectal cancer Neg Hx    Stomach cancer Neg Hx     Past Surgical History:  Procedure Laterality Date   BACK SURGERY     DISTAL BICEPS TENDON REPAIR Right 01/06/2024   Procedure: REPAIR, TENDON, BICEPS, DISTAL;  Surgeon: Jerri Kay HERO, MD;  Location: [facility];  Service: Orthopedics;  Laterality: Right;   DISTAL BICEPS TENDON REPAIR Left 01/20/2024   Procedure: REPAIR, TENDON, BICEPS, DISTAL;  Surgeon: Jerri Kay HERO, MD;  Location: [facility];  Service: Orthopedics;  Laterality: Left;   ENDOVENOUS ABLATION SAPHENOUS VEIN W/ LASER Left 06/23/2019   endovenous laser ablation left greater saphenous vein and stab phlebectomy 10-20 incisions left leg by Medford Blade MD    Social History   Occupational History   Not on file  Tobacco Use   Smoking status: Every Day    Types: Cigars   Smokeless tobacco: Never  Vaping Use   Vaping status: Never Used  Substance and Sexual Activity   Alcohol use: Yes    Comment: occasionally  Drug use: No   Sexual activity: Not on file

## 2024-01-27 NOTE — Addendum Note (Signed)
 Addendum  created 01/27/24 2030 by Mallory Manus, MD   Clinical Note Signed, Intraprocedure Blocks edited, SmartForm saved

## 2024-01-28 ENCOUNTER — Other Ambulatory Visit (HOSPITAL_COMMUNITY): Payer: Self-pay

## 2024-02-08 ENCOUNTER — Other Ambulatory Visit (HOSPITAL_COMMUNITY): Payer: Self-pay

## 2024-02-09 ENCOUNTER — Ambulatory Visit: Admitting: Orthopaedic Surgery

## 2024-02-09 ENCOUNTER — Encounter: Payer: Self-pay | Admitting: Orthopaedic Surgery

## 2024-02-09 DIAGNOSIS — S46211A Strain of muscle, fascia and tendon of other parts of biceps, right arm, initial encounter: Secondary | ICD-10-CM

## 2024-02-09 NOTE — Progress Notes (Signed)
 Post-Op Visit Note   Patient: William Franco           Date of Birth: 04/04/1976           MRN: [id number] Visit Date: 02/09/2024 PCP: Donah Laymon PARAS, MD   Assessment & Plan:  Chief Complaint:  Chief Complaint  Patient presents with   Left Elbow - Follow-up    REPAIR, TENDON, BICEPS, DISTAL, LEFT 01/20/2024   Visit Diagnoses:  1. Rupture of distal biceps tendon, right, initial encounter     Plan: History of Present Illness William Franco is a 48 year old male who presents for follow-up after right distal biceps tendon re-repair.  He is three weeks post-operative from a distal biceps tendon re-repair performed on January 20, 2024. He experiences pain during rotation, which stretches the distal biceps tendon, but no pain during arm extension or flexion.  He returned to work last week on light duty with no lifting restrictions. He is scheduled to start physical therapy on Friday.  He experiences numbness and a 'funny' sensation from the forearm to the hand, particularly when working with his hands.  Physical Exam MUSCULOSKELETAL: No pain on elbow extension or flexion. Pain on elbow rotation.  ROM is progressing very nicely. SKIN: Scar healing well.  Assessment and Plan Status post right distal biceps tendon re-repair with post-operative forearm numbness and pain Post-operative numbness and pain likely due to lateral antebrachial cutaneous nerve manipulation. Sensory changes may be temporary or permanent. Pain during rotation indicates ongoing tendon recovery. Range of motion progressing well. - Remove sutures and stereostrips today. - Instruct to peel off stereostrips in one week. - Allow showering now. - Start physical therapy on Friday. - Continue light duty at work with no lifting for six weeks, then gradually increase lifting capacity. - Re-evaluate work status in three weeks, with potential to increase lifting to 5-10 pounds. - Schedule follow-up  appointment in three weeks.  Follow-Up Instructions: Return in about 3 weeks (around 03/01/2024).   Orders:  No orders of the defined types were placed in this encounter.  No orders of the defined types were placed in this encounter.   Imaging: No results found.  PMFS History: Patient Active Problem List   Diagnosis Date Noted   Rupture of distal biceps tendon, right, initial encounter 01/06/2024   Routine adult health maintenance 06/06/2023   Cluster headache, intractable 08/31/2021   Hyperlipidemia 04/02/2021   Pain in the abdomen 08/09/2019   Prediabetes 05/18/2019   Varicose vein of leg 10/19/2018   Seasonal allergies 10/19/2018   Gout 08/06/2016   Erectile dysfunction 04/19/2012   Hypertension 12/18/2010   Recurrent genital herpes 12/18/2010   Tobacco abuse 12/18/2010   Obesity 12/18/2010   Past Medical History:  Diagnosis Date   Asthma    Hypertension    Kidney stones    Pre-diabetes    Spinal stenosis, lumbar region, 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    Family History  Problem Relation Age of Onset   Hypertension Father    Hypertension Mother    Colon cancer Neg Hx    Colon polyps Neg Hx    Esophageal cancer Neg Hx    Rectal cancer Neg Hx    Stomach cancer Neg Hx     Past Surgical History:  Procedure Laterality Date   BACK SURGERY     DISTAL BICEPS TENDON REPAIR Right 01/06/2024   Procedure: REPAIR, TENDON, BICEPS, DISTAL;  Surgeon: Jerri Kay HERO, MD;  Location: [facility];  Service: Orthopedics;  Laterality: Right;   DISTAL BICEPS TENDON REPAIR Left 01/20/2024   Procedure: REPAIR, TENDON, BICEPS, DISTAL;  Surgeon: Jerri Kay HERO, MD;  Location: [facility];  Service: Orthopedics;  Laterality: Left;   ENDOVENOUS ABLATION SAPHENOUS VEIN W/ LASER Left 06/23/2019   endovenous laser ablation left greater saphenous vein and stab phlebectomy 10-20 incisions left leg by Medford Blade MD    Social History   Occupational  History   Not on file  Tobacco Use   Smoking status: Every Day    Types: Cigars   Smokeless tobacco: Never  Vaping Use   Vaping status: Never Used  Substance and Sexual Activity   Alcohol use: Yes    Comment: occasionally   Drug use: No   Sexual activity: Not on file

## 2024-02-11 ENCOUNTER — Ambulatory Visit

## 2024-02-11 ENCOUNTER — Other Ambulatory Visit: Payer: Self-pay

## 2024-02-11 DIAGNOSIS — M25621 Stiffness of right elbow, not elsewhere classified: Secondary | ICD-10-CM | POA: Diagnosis not present

## 2024-02-11 DIAGNOSIS — M6281 Muscle weakness (generalized): Secondary | ICD-10-CM

## 2024-02-11 DIAGNOSIS — M25521 Pain in right elbow: Secondary | ICD-10-CM | POA: Diagnosis not present

## 2024-02-11 NOTE — Therapy (Signed)
 OUTPATIENT PHYSICAL THERAPY UPPER EXTREMITY EVALUATION   Patient Name: William Franco MRN: [id number] DOB:1976/04/26, 48 y.o., male Today's Date: 02/12/2024  END OF SESSION:  PT End of Session - 02/12/24 0723     Visit Number 1    Number of Visits 21    Date for Recertification  04/22/24    PT Start Time 1345    PT Stop Time 1420    PT Time Calculation (min) 35 min    Activity Tolerance Patient tolerated treatment well    Behavior During Therapy [facility] for tasks assessed/performed          Past Medical History:  Diagnosis Date   Asthma    Hypertension    Kidney stones    Pre-diabetes    Spinal stenosis, lumbar region, 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   Past Surgical History:  Procedure Laterality Date   BACK SURGERY     DISTAL BICEPS TENDON REPAIR Right 01/06/2024   Procedure: REPAIR, TENDON, BICEPS, DISTAL;  Surgeon: Jerri Kay HERO, MD;  Location: [facility];  Service: Orthopedics;  Laterality: Right;   DISTAL BICEPS TENDON REPAIR Left 01/20/2024   Procedure: REPAIR, TENDON, BICEPS, DISTAL;  Surgeon: Jerri Kay HERO, MD;  Location: [facility];  Service: Orthopedics;  Laterality: Left;   ENDOVENOUS ABLATION SAPHENOUS VEIN W/ LASER Left 06/23/2019   endovenous laser ablation left greater saphenous vein and stab phlebectomy 10-20 incisions left leg by Medford Blade MD    Patient Active Problem List   Diagnosis Date Noted   Rupture of distal biceps tendon, right, initial encounter 01/06/2024   Routine adult health maintenance 06/06/2023   Cluster headache, intractable 08/31/2021   Hyperlipidemia 04/02/2021   Pain in the abdomen 08/09/2019   Prediabetes 05/18/2019   Varicose vein of leg 10/19/2018   Seasonal allergies 10/19/2018   Gout 08/06/2016   Erectile dysfunction 04/19/2012   Hypertension 12/18/2010   Recurrent genital herpes 12/18/2010   Tobacco abuse 12/18/2010   Obesity 12/18/2010    PCP: Donah Laymon PARAS,  MD   REFERRING PROVIDER: Jule Ronal CROME, PA-C   REFERRING DIAG: [phone] (ICD-10-CM) - Rupture of distal biceps tendon, right, initial encounter   THERAPY DIAG:  Pain in right elbow  Stiffness of right elbow, not elsewhere classified  Muscle weakness (generalized)  Rationale for Evaluation and Treatment: Rehabilitation  ONSET DATE: 01/20/24  SUBJECTIVE:                                                                                                                                                                                      SUBJECTIVE STATEMENT: Patient reports rupturing the right bicep tendon, going in  for repair on 01/08/24.   Post repair having a second rupture and a second repair on 01/20/24.  He reports minimal pain at this point and he has returned to work only using the left upper extremity for any lifting required. He is still restricted to no lifting with the right upper extremity. Hand dominance: Right  PERTINENT HISTORY: No previous R shoulder /elbow surgery  PAIN:  Are you having pain? Yes: NPRS scale: 3/10 Pain location: along R forearm and thumb Pain description: radiating Aggravating factors: motion, working Relieving factors: rest  PRECAUTIONS: Other: lifting  RED FLAGS: None   WEIGHT BEARING RESTRICTIONS: Yes on UE for 6 weeks  FALLS:  Has patient fallen in last 6 months? No  LIVING ENVIRONMENT: Lives with: lives with their family Lives in: House/apartment  OCCUPATION: Works at [employer].  Lifts only only with the left.  PLOF: Independent and Independent with basic ADLs  PATIENT GOALS: Increase strength  NEXT MD VISIT: @11 /4/25  OBJECTIVE:  Note: Objective measures were completed at Evaluation unless otherwise noted.  DIAGNOSTIC FINDINGS:  01/13/24 Xray- X-rays of the right elbow show no acute bony abnormalities.  Metallic  button is properly positioned on the dorsal cortex of the proximal radius.  12/30/23  MRI  -IMPRESSION: Full-thickness retracted biceps tendon tear as above.  01/13/24 MRI- IMPRESSION: 1. The biceps tendon has ruptured from its surgical attachment site on the radius. The tendon is retracted approximately 17 mm. Associated severe surrounding inflammation/edema/hemorrhage. 2. Mild tendinopathy involving the common flexor tendon. 3. Intact medial and lateral collateral ligaments.    PATIENT SURVEYS :  PSFS: THE PATIENT SPECIFIC FUNCTIONAL SCALE  Place score of 0-10 (0 = unable to perform activity and 10 = able to perform activity at the same level as before injury or problem)  Activity Date: 02/11/24    Lifting anything 0    2.Lifting > 8 lbs 0    3.Putting on deoderant 6    4.      Total Score 2      Total Score = Sum of activity scores/number of activities  Minimally Detectable Change: 3 points (for single activity); 2 points (for average score)  [facility] (nd). The Patient Specific Functional Scale . Retrieved from [email or web address]   COGNITION: Overall cognitive status: Within functional limits for tasks assessed     SENSATION: WFL  POSTURE: WNL  UPPER EXTREMITY ROM:   A/P ROM Right eval Left eval  Shoulder flexion    Shoulder extension    Shoulder abduction    Shoulder adduction    Shoulder internal rotation    Shoulder external rotation    Elbow flexion 125   Elbow extension +8   Wrist flexion 50%   Wrist extension 50%   Wrist ulnar deviation    Wrist radial deviation    Wrist pronation    Wrist supination 75%   (Blank rows = not tested)  UPPER EXTREMITY MMT:  MMT Right eval Left eval  Shoulder flexion    Shoulder extension    Shoulder abduction    Shoulder adduction    Shoulder internal rotation    Shoulder external rotation    Middle trapezius    Lower trapezius    Elbow flexion 3+   Elbow extension 3+   Wrist flexion 4   Wrist extension 4   Wrist ulnar  deviation    Wrist radial deviation    Wrist pronation    Wrist supination    Grip  strength (lbs)    (Blank rows = not tested)  SHOULDER SPECIAL TESTS: s/p no tests  JOINT MOBILITY TESTING:  WFL  PALPATION:  1+ tenderness at incision                                                                                                                             TREATMENT DATE:  02/11/24  Initial evaluation of R elbow completed f/b instrucxtion in HEP   PATIENT EDUCATION: Education details: HEP Person educated: Patient Education method: Explanation, Demonstration, Tactile cues, and Verbal cues Education comprehension: verbalized understanding, returned demonstration, and verbal cues required  HOME EXERCISE PROGRAM: Access Code: [email or web address] URL: [email or web address] Date: 02/11/2024 Prepared by: Burnard Meth  Exercises - Seated Elbow Flexion and Extension AROM  - 2 x daily - 7 x weekly - 2 sets - 10 reps - Seated Forearm Pronation and Supination AROM  - 2 x daily - 7 x weekly - 2 sets - 10 reps - Wrist Flexion Extension AROM - Palms Down  - 2 x daily - 7 x weekly - 2 sets - 10 reps - Seated Wrist Flexion AROM  - 2 x daily - 7 x weekly - 2 sets - 10 reps - Seated Scapular Retraction  - 2 x daily - 7 x weekly - 2 sets - 10 reps - Seated Shoulder Shrugs  - 2 x daily - 7 x weekly - 2 sets - 10 reps  ASSESSMENT:  CLINICAL IMPRESSION: Patient is a 48 y.o. male who was seen today for physical therapy evaluation and treatment for s/p R biceps tendon repair.  He presents with decreased strength, decreased ROM, lifting restrictions and pain.   He would benefit from skilled PT to address these deficits and return to previous LOA.   OBJECTIVE IMPAIRMENTS: decreased ROM, decreased strength, impaired flexibility, impaired UE functional use, and pain.   ACTIVITY LIMITATIONS: carrying, lifting, and dressing  PARTICIPATION LIMITATIONS: meal prep, cleaning, and  occupation  PERSONAL FACTORS: None are also affecting patient's functional outcome.   REHAB POTENTIAL: Good  CLINICAL DECISION MAKING: Stable/uncomplicated  EVALUATION COMPLEXITY: Moderate  GOALS: Goals reviewed with patient? Yes  SHORT TERM GOALS: Target date: 03/11/2024    Patient to be independent with HEP. Baseline: Goal status: INITIAL  2.  Decreased pain by 1 level with all ADLs. Baseline:  Goal status: INITIAL  LONG TERM GOALS: Target date: 04/22/2024  10 weeks    Increase active range of motion of right elbow and forearm to within normal limits and pain-free. Baseline:  Goal status: INITIAL  2.  Increase strength of right upper extremity to at least 4 out of 5 by time of discharge.  Plan Baseline: 3/5 Goal status: INITIAL  3.  Minimal to no pain less than 2 out of 10 with all ADLs and work activities. Baseline: 3/10 Goal status: INITIAL  4.  Patient able to lift with no pain or restriction weight designated by doctor at time of  discharge Baseline: restricted Goal status: INITIAL  5.  Increase PSFS score by 3 points. Baseline: 2 Goal status: INITIAL  PLAN: PT FREQUENCY: 1-2x/week  PT DURATION: 10 weeks  PLANNED INTERVENTIONS: [id number]- PT Re-evaluation, 97110-Therapeutic exercises, 97530- Therapeutic activity, [id number]- Neuromuscular re-education, 97535- Self Care, [id number]- Manual therapy, Patient/Family education, Joint mobilization, and Scar mobilization  PLAN FOR NEXT SESSION: Start on UBE and review HEP.   Burnard CHRISTELLA Meth, PT 02/12/2024, 7:25 AM

## 2024-02-26 ENCOUNTER — Encounter: Admitting: Rehabilitative and Restorative Service Providers"

## 2024-02-26 ENCOUNTER — Ambulatory Visit (INDEPENDENT_AMBULATORY_CARE_PROVIDER_SITE_OTHER)

## 2024-02-26 DIAGNOSIS — M6281 Muscle weakness (generalized): Secondary | ICD-10-CM

## 2024-02-26 DIAGNOSIS — M25621 Stiffness of right elbow, not elsewhere classified: Secondary | ICD-10-CM

## 2024-02-26 DIAGNOSIS — M25521 Pain in right elbow: Secondary | ICD-10-CM | POA: Diagnosis not present

## 2024-02-26 NOTE — Therapy (Addendum)
 OUTPATIENT PHYSICAL THERAPY UPPER EXTREMITY TREATMENT/DISCHARGE   Patient Name: William Franco MRN: [id number] DOB:1976-01-06, 48 y.o., male Today's Date: 02/26/2024  END OF SESSION:  PT End of Session - 02/26/24 1424     Visit Number 2    Number of Visits 21    Date for Recertification  04/22/24    PT Start Time 1429    PT Stop Time 1512    PT Time Calculation (min) 43 min    Activity Tolerance Patient tolerated treatment well    Behavior During Therapy [facility] for tasks assessed/performed          Past Medical History:  Diagnosis Date   Asthma    Hypertension    Kidney stones    Pre-diabetes    Spinal stenosis, lumbar region, 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   Past Surgical History:  Procedure Laterality Date   BACK SURGERY     DISTAL BICEPS TENDON REPAIR Right 01/06/2024   Procedure: REPAIR, TENDON, BICEPS, DISTAL;  Surgeon: Jerri Kay HERO, MD;  Location: [facility];  Service: Orthopedics;  Laterality: Right;   DISTAL BICEPS TENDON REPAIR Left 01/20/2024   Procedure: REPAIR, TENDON, BICEPS, DISTAL;  Surgeon: Jerri Kay HERO, MD;  Location: [facility];  Service: Orthopedics;  Laterality: Left;   ENDOVENOUS ABLATION SAPHENOUS VEIN W/ LASER Left 06/23/2019   endovenous laser ablation left greater saphenous vein and stab phlebectomy 10-20 incisions left leg by Medford Blade MD    Patient Active Problem List   Diagnosis Date Noted   Rupture of distal biceps tendon, right, initial encounter 01/06/2024   Routine adult health maintenance 06/06/2023   Cluster headache, intractable 08/31/2021   Hyperlipidemia 04/02/2021   Pain in the abdomen 08/09/2019   Prediabetes 05/18/2019   Varicose vein of leg 10/19/2018   Seasonal allergies 10/19/2018   Gout 08/06/2016   Erectile dysfunction 04/19/2012   Hypertension 12/18/2010   Recurrent genital herpes 12/18/2010   Tobacco abuse 12/18/2010   Obesity 12/18/2010    PCP: Donah Laymon PARAS,  MD   REFERRING PROVIDER: Jule Ronal CROME, PA-C   REFERRING DIAG: [phone] (ICD-10-CM) - Rupture of distal biceps tendon, right, initial encounter   THERAPY DIAG:  Pain in right elbow  Stiffness of right elbow, not elsewhere classified  Muscle weakness (generalized)  Rationale for Evaluation and Treatment: Rehabilitation  ONSET DATE: 01/20/24  SUBJECTIVE:                                                                                                                                                                                      SUBJECTIVE STATEMENT: Patient endorsing continued nerve pain that he has previously  discussed with MD.    Hand dominance: Right  PERTINENT HISTORY: No previous R shoulder /elbow surgery  Patient reports rupturing the right bicep tendon, going in for repair on 01/08/24.   Post repair having a second rupture and a second repair on 01/20/24.  He reports minimal pain at this point and he has returned to work only using the left upper extremity for any lifting required. He is still restricted to no lifting with the right upper extremity.  PAIN:  Are you having pain? Yes: NPRS scale: not rated this session Pain location: along R forearm and thumb Pain description: radiating Aggravating factors: motion, working Relieving factors: rest  PRECAUTIONS: Other: lifting  RED FLAGS: None   WEIGHT BEARING RESTRICTIONS: Yes on UE for 6 weeks  FALLS:  Has patient fallen in last 6 months? No  LIVING ENVIRONMENT: Lives with: lives with their family Lives in: House/apartment  OCCUPATION: Works at [employer].  Lifts only only with the left.  PLOF: Independent and Independent with basic ADLs  PATIENT GOALS: Increase strength  NEXT MD VISIT: @11 /4/25  OBJECTIVE:  Note: Objective measures were completed at Evaluation unless otherwise noted.  DIAGNOSTIC FINDINGS:  01/13/24 Xray- X-rays of the right elbow show no acute bony  abnormalities.  Metallic  button is properly positioned on the dorsal cortex of the proximal radius.  12/30/23  MRI -IMPRESSION: Full-thickness retracted biceps tendon tear as above.  01/13/24 MRI- IMPRESSION: 1. The biceps tendon has ruptured from its surgical attachment site on the radius. The tendon is retracted approximately 17 mm. Associated severe surrounding inflammation/edema/hemorrhage. 2. Mild tendinopathy involving the common flexor tendon. 3. Intact medial and lateral collateral ligaments.    PATIENT SURVEYS :  PSFS: THE PATIENT SPECIFIC FUNCTIONAL SCALE  Place score of 0-10 (0 = unable to perform activity and 10 = able to perform activity at the same level as before injury or problem)  Activity Date: 02/11/24    Lifting anything 0    2.Lifting > 8 lbs 0    3.Putting on deoderant 6    4.      Total Score 2      Total Score = Sum of activity scores/number of activities  Minimally Detectable Change: 3 points (for single activity); 2 points (for average score)  [facility] (nd). The Patient Specific Functional Scale . Retrieved from [email or web address]   COGNITION: Overall cognitive status: Within functional limits for tasks assessed     SENSATION: WFL  POSTURE: WNL  UPPER EXTREMITY ROM:   A/P ROM Right eval Left eval  Shoulder flexion    Shoulder extension    Shoulder abduction    Shoulder adduction    Shoulder internal rotation    Shoulder external rotation    Elbow flexion 125   Elbow extension +8   Wrist flexion 50%   Wrist extension 50%   Wrist ulnar deviation    Wrist radial deviation    Wrist pronation    Wrist supination 75%   (Blank rows = not tested)  UPPER EXTREMITY MMT:  MMT Right eval Left eval  Shoulder flexion    Shoulder extension    Shoulder abduction    Shoulder adduction    Shoulder internal rotation    Shoulder external rotation    Middle trapezius     Lower trapezius    Elbow flexion 3+   Elbow extension 3+   Wrist flexion 4   Wrist extension 4   Wrist ulnar deviation  Wrist radial deviation    Wrist pronation    Wrist supination    Grip strength (lbs)    (Blank rows = not tested)  SHOULDER SPECIAL TESTS: s/p no tests  JOINT MOBILITY TESTING:  WFL  PALPATION:  1+ tenderness at incision                                                                                                                             TREATMENT DATE:  02/26/2024 Manual:  IASTM with cup for scar massage  Caused very minimal bleeding at distal scar; patient educated on importance of keeping the area clean  TherEx:  UBE with bilat UE and LE for ROM level 1 for 6 minutes  Sidelying AROM shoulder flexion 2x10 Sidelying AROM shoulder abduction 2x10  Sidelying AROM ER 2x10  Seated AROM elbow flexion into extension 2x10    02/11/24  Initial evaluation of R elbow completed f/b instrucxtion in HEP   PATIENT EDUCATION: Education details: HEP Person educated: Patient Education method: Programmer, Multimedia, Demonstration, Actor cues, and Verbal cues Education comprehension: verbalized understanding, returned demonstration, and verbal cues required  HOME EXERCISE PROGRAM: Access Code: [email or web address] URL: [email or web address] Date: 02/11/2024 Prepared by: Burnard Meth  Exercises - Seated Elbow Flexion and Extension AROM  - 2 x daily - 7 x weekly - 2 sets - 10 reps - Seated Forearm Pronation and Supination AROM  - 2 x daily - 7 x weekly - 2 sets - 10 reps - Wrist Flexion Extension AROM - Palms Down  - 2 x daily - 7 x weekly - 2 sets - 10 reps - Seated Wrist Flexion AROM  - 2 x daily - 7 x weekly - 2 sets - 10 reps - Seated Scapular Retraction  - 2 x daily - 7 x weekly - 2 sets - 10 reps - Seated Shoulder Shrugs  - 2 x daily - 7 x weekly - 2 sets - 10 reps  ASSESSMENT:  CLINICAL IMPRESSION: Patient arrived to session noting no pain or  soreness, though experiencing nerve pain that he has discussed with the MD about. Patient tolerated all activities this date. Patient did experience very minimal bleeding during cupping for scar massage this date and was educated on importance of cleaning the area. Patient will continue to benefit from skilled PT.   OBJECTIVE IMPAIRMENTS: decreased ROM, decreased strength, impaired flexibility, impaired UE functional use, and pain.   ACTIVITY LIMITATIONS: carrying, lifting, and dressing  PARTICIPATION LIMITATIONS: meal prep, cleaning, and occupation  PERSONAL FACTORS: None are also affecting patient's functional outcome.   REHAB POTENTIAL: Good  CLINICAL DECISION MAKING: Stable/uncomplicated  EVALUATION COMPLEXITY: Moderate  GOALS: Goals reviewed with patient? Yes  SHORT TERM GOALS: Target date: 03/11/2024    Patient to be independent with HEP. Baseline: Goal status: INITIAL  2.  Decreased pain by 1 level with all ADLs. Baseline:  Goal status: INITIAL  LONG TERM GOALS: Target date: 04/22/2024  10 weeks  Increase active range of motion of right elbow and forearm to within normal limits and pain-free. Baseline:  Goal status: INITIAL  2.  Increase strength of right upper extremity to at least 4 out of 5 by time of discharge.  Plan Baseline: 3/5 Goal status: INITIAL  3.  Minimal to no pain less than 2 out of 10 with all ADLs and work activities. Baseline: 3/10 Goal status: INITIAL  4.  Patient able to lift with no pain or restriction weight designated by doctor at time of discharge Baseline: restricted Goal status: INITIAL  5.  Increase PSFS score by 3 points. Baseline: 2 Goal status: INITIAL  PLAN: PT FREQUENCY: 1-2x/week  PT DURATION: 10 weeks  PLANNED INTERVENTIONS: 97164- PT Re-evaluation, 97110-Therapeutic exercises, 97530- Therapeutic activity, 97112- Neuromuscular re-education, 97535- Self Care, [id number]- Manual therapy, Patient/Family education, Joint  mobilization, and Scar mobilization  PLAN FOR NEXT SESSION:   PHYSICAL THERAPY DISCHARGE SUMMARY  Visits from Start of Care: 2  Current functional level related to goals / functional outcomes: SEE ABOVE    Remaining deficits: See above    Education / Equipment: HEP    Patient agrees to discharge. Patient goals were partially met. Patient is being discharged due to patient endorsing recovering well on his own and having other personal issues happening that would cause multiple copays.   Susannah Daring, PT, DPT 02/26/24 3:17 PM

## 2024-02-29 ENCOUNTER — Encounter: Payer: Self-pay | Admitting: Radiology

## 2024-03-01 ENCOUNTER — Ambulatory Visit (HOSPITAL_COMMUNITY)
Admission: EM | Admit: 2024-03-01 | Discharge: 2024-03-01 | Disposition: A | Discharge status: Home / Self Care | Attending: Emergency Medicine | Admitting: Emergency Medicine

## 2024-03-01 ENCOUNTER — Ambulatory Visit: Admitting: Orthopaedic Surgery

## 2024-03-01 ENCOUNTER — Other Ambulatory Visit (HOSPITAL_COMMUNITY): Payer: Self-pay

## 2024-03-01 ENCOUNTER — Encounter (HOSPITAL_COMMUNITY): Payer: Self-pay | Admitting: Emergency Medicine

## 2024-03-01 DIAGNOSIS — N3090 Cystitis, unspecified without hematuria: Secondary | ICD-10-CM | POA: Diagnosis not present

## 2024-03-01 DIAGNOSIS — S46211A Strain of muscle, fascia and tendon of other parts of biceps, right arm, initial encounter: Secondary | ICD-10-CM

## 2024-03-01 DIAGNOSIS — R310 Gross hematuria: Secondary | ICD-10-CM | POA: Diagnosis present

## 2024-03-01 LAB — POCT URINE DIPSTICK
Bilirubin, UA: NEGATIVE
Glucose, UA: NEGATIVE mg/dL
Ketones, POC UA: NEGATIVE mg/dL
Leukocytes, UA: NEGATIVE
Nitrite, UA: NEGATIVE
Spec Grav, UA: 1.02
Urobilinogen, UA: 4 U/dL — AB
pH, UA: 7

## 2024-03-01 MED ORDER — GABAPENTIN 100 MG PO CAPS
100.0000 mg | ORAL_CAPSULE | Freq: Every day | ORAL | 3 refills | Status: AC
  Filled 2024-03-01: qty 30, 10d supply, fill #0

## 2024-03-01 MED ORDER — SULFAMETHOXAZOLE-TRIMETHOPRIM 800-160 MG PO TABS
1.0000 | ORAL_TABLET | Freq: Two times a day (BID) | ORAL | 0 refills | Status: AC
  Filled 2024-03-01: qty 14, 7d supply, fill #0

## 2024-03-01 MED ORDER — TAMSULOSIN HCL 0.4 MG PO CAPS
0.4000 mg | ORAL_CAPSULE | Freq: Every day | ORAL | 2 refills | Status: AC
  Filled 2024-03-01: qty 30, 30d supply, fill #0

## 2024-03-01 NOTE — ED Triage Notes (Signed)
 Pt st's he started with burning when urinating on Friday then today he said he has blood in his urine.

## 2024-03-01 NOTE — Therapy (Incomplete)
 OUTPATIENT PHYSICAL THERAPY UPPER EXTREMITY TREATMENT   Patient Name: William Franco MRN: [id number] DOB:1975-11-19, 48 y.o., male Today's Date: 03/01/2024  END OF SESSION:    Past Medical History:  Diagnosis Date   Asthma    Hypertension    Kidney stones    Pre-diabetes    Spinal stenosis, lumbar region, 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   Past Surgical History:  Procedure Laterality Date   BACK SURGERY     DISTAL BICEPS TENDON REPAIR Right 01/06/2024   Procedure: REPAIR, TENDON, BICEPS, DISTAL;  Surgeon: Jerri Kay HERO, MD;  Location: [facility];  Service: Orthopedics;  Laterality: Right;   DISTAL BICEPS TENDON REPAIR Left 01/20/2024   Procedure: REPAIR, TENDON, BICEPS, DISTAL;  Surgeon: Jerri Kay HERO, MD;  Location: [facility];  Service: Orthopedics;  Laterality: Left;   ENDOVENOUS ABLATION SAPHENOUS VEIN W/ LASER Left 06/23/2019   endovenous laser ablation left greater saphenous vein and stab phlebectomy 10-20 incisions left leg by Medford Blade MD    Patient Active Problem List   Diagnosis Date Noted   Rupture of distal biceps tendon, right, initial encounter 01/06/2024   Routine adult health maintenance 06/06/2023   Cluster headache, intractable 08/31/2021   Hyperlipidemia 04/02/2021   Pain in the abdomen 08/09/2019   Prediabetes 05/18/2019   Varicose vein of leg 10/19/2018   Seasonal allergies 10/19/2018   Gout 08/06/2016   Erectile dysfunction 04/19/2012   Hypertension 12/18/2010   Recurrent genital herpes 12/18/2010   Tobacco abuse 12/18/2010   Obesity 12/18/2010    PCP: Donah Laymon PARAS, MD   REFERRING PROVIDER: Donah Laymon PARAS, MD   REFERRING DIAG: [id number] (ICD-10-CM) - Rupture of distal biceps tendon, right, initial encounter   THERAPY DIAG:  No diagnosis found.  Rationale for Evaluation and Treatment: Rehabilitation  ONSET DATE: 01/20/24  SUBJECTIVE:                                                                                                                                                                                       SUBJECTIVE STATEMENT: ***Patient endorsing continued nerve pain that he has previously discussed with MD.    Hand dominance: Right  PERTINENT HISTORY: No previous R shoulder /elbow surgery  Patient reports rupturing the right bicep tendon, going in for repair on 01/08/24.   Post repair having a second rupture and a second repair on 01/20/24.  He reports minimal pain at this point and he has returned to work only using the left upper extremity for any lifting required. He is still restricted to no lifting with the right upper extremity.  PAIN:  ***Are you having pain? Yes: NPRS  scale: not rated this session Pain location: along R forearm and thumb Pain description: radiating Aggravating factors: motion, working Relieving factors: rest  PRECAUTIONS: Other: lifting  RED FLAGS: None   WEIGHT BEARING RESTRICTIONS: Yes on UE for 6 weeks  FALLS:  Has patient fallen in last 6 months? No  LIVING ENVIRONMENT: Lives with: lives with their family Lives in: House/apartment  OCCUPATION: Works at [employer].  Lifts only only with the left.  PLOF: Independent and Independent with basic ADLs  PATIENT GOALS: Increase strength  NEXT MD VISIT: @11 /4/25  OBJECTIVE:  Note: Objective measures were completed at Evaluation unless otherwise noted.  DIAGNOSTIC FINDINGS:  01/13/24 Xray- X-rays of the right elbow show no acute bony abnormalities.  Metallic  button is properly positioned on the dorsal cortex of the proximal radius.  12/30/23  MRI -IMPRESSION: Full-thickness retracted biceps tendon tear as above.  01/13/24 MRI- IMPRESSION: 1. The biceps tendon has ruptured from its surgical attachment site on the radius. The tendon is retracted approximately 17 mm. Associated severe surrounding inflammation/edema/hemorrhage. 2. Mild tendinopathy  involving the common flexor tendon. 3. Intact medial and lateral collateral ligaments.    PATIENT SURVEYS :  PSFS: THE PATIENT SPECIFIC FUNCTIONAL SCALE  Place score of 0-10 (0 = unable to perform activity and 10 = able to perform activity at the same level as before injury or problem)  Activity Date: 02/11/24    Lifting anything 0    2.Lifting > 8 lbs 0    3.Putting on deoderant 6    4.      Total Score 2      Total Score = Sum of activity scores/number of activities  Minimally Detectable Change: 3 points (for single activity); 2 points (for average score)  [facility] (nd). The Patient Specific Functional Scale . Retrieved from [email or web address]   COGNITION: Overall cognitive status: Within functional limits for tasks assessed     SENSATION: WFL  POSTURE: WNL  UPPER EXTREMITY ROM:   A/P ROM Right eval Left eval  Shoulder flexion    Shoulder extension    Shoulder abduction    Shoulder adduction    Shoulder internal rotation    Shoulder external rotation    Elbow flexion 125   Elbow extension +8   Wrist flexion 50%   Wrist extension 50%   Wrist ulnar deviation    Wrist radial deviation    Wrist pronation    Wrist supination 75%   (Blank rows = not tested)  UPPER EXTREMITY MMT:  MMT Right eval Left eval  Shoulder flexion    Shoulder extension    Shoulder abduction    Shoulder adduction    Shoulder internal rotation    Shoulder external rotation    Middle trapezius    Lower trapezius    Elbow flexion 3+   Elbow extension 3+   Wrist flexion 4   Wrist extension 4   Wrist ulnar deviation    Wrist radial deviation    Wrist pronation    Wrist supination    Grip strength (lbs)    (Blank rows = not tested)  SHOULDER SPECIAL TESTS: s/p no tests  JOINT MOBILITY TESTING:  WFL  PALPATION:  1+ tenderness at incision  TREATMENT DATE:  03/02/24***     02/26/2024 Manual:  IASTM with cup for scar massage  Caused very minimal bleeding at distal scar; patient educated on importance of keeping the area clean  TherEx:  UBE with bilat UE and LE for ROM level 1 for 6 minutes  Sidelying AROM shoulder flexion 2x10 Sidelying AROM shoulder abduction 2x10  Sidelying AROM ER 2x10  Seated AROM elbow flexion into extension 2x10    02/11/24  Initial evaluation of R elbow completed f/b instrucxtion in HEP   PATIENT EDUCATION: Education details: HEP Person educated: Patient Education method: Programmer, Multimedia, Demonstration, Actor cues, and Verbal cues Education comprehension: verbalized understanding, returned demonstration, and verbal cues required  HOME EXERCISE PROGRAM: Access Code: [email or web address] URL: [email or web address] Date: 02/11/2024 Prepared by: Burnard Meth  Exercises - Seated Elbow Flexion and Extension AROM  - 2 x daily - 7 x weekly - 2 sets - 10 reps - Seated Forearm Pronation and Supination AROM  - 2 x daily - 7 x weekly - 2 sets - 10 reps - Wrist Flexion Extension AROM - Palms Down  - 2 x daily - 7 x weekly - 2 sets - 10 reps - Seated Wrist Flexion AROM  - 2 x daily - 7 x weekly - 2 sets - 10 reps - Seated Scapular Retraction  - 2 x daily - 7 x weekly - 2 sets - 10 reps - Seated Shoulder Shrugs  - 2 x daily - 7 x weekly - 2 sets - 10 reps  ASSESSMENT:  CLINICAL IMPRESSION: ***Patient arrived to session noting no pain or soreness, though experiencing nerve pain that he has discussed with the MD about. Patient tolerated all activities this date. Patient did experience very minimal bleeding during cupping for scar massage this date and was educated on importance of cleaning the area. Patient will continue to benefit from skilled PT.   OBJECTIVE IMPAIRMENTS: decreased ROM, decreased strength, impaired flexibility, impaired UE  functional use, and pain.   ACTIVITY LIMITATIONS: carrying, lifting, and dressing  PARTICIPATION LIMITATIONS: meal prep, cleaning, and occupation  PERSONAL FACTORS: None are also affecting patient's functional outcome.   REHAB POTENTIAL: Good  CLINICAL DECISION MAKING: Stable/uncomplicated  EVALUATION COMPLEXITY: Moderate  GOALS: Goals reviewed with patient? Yes  SHORT TERM GOALS: Target date: 03/11/2024    Patient to be independent with HEP. Baseline: Goal status: INITIAL  2.  Decreased pain by 1 level with all ADLs. Baseline:  Goal status: INITIAL  LONG TERM GOALS: Target date: 04/22/2024  10 weeks    Increase active range of motion of right elbow and forearm to within normal limits and pain-free. Baseline:  Goal status: INITIAL  2.  Increase strength of right upper extremity to at least 4 out of 5 by time of discharge.  Plan Baseline: 3/5 Goal status: INITIAL  3.  Minimal to no pain less than 2 out of 10 with all ADLs and work activities. Baseline: 3/10 Goal status: INITIAL  4.  Patient able to lift with no pain or restriction weight designated by doctor at time of discharge Baseline: restricted Goal status: INITIAL  5.  Increase PSFS score by 3 points. Baseline: 2 Goal status: INITIAL  PLAN: PT FREQUENCY: 1-2x/week  PT DURATION: 10 weeks  PLANNED INTERVENTIONS: 97164- PT Re-evaluation, 97110-Therapeutic exercises, 97530- Therapeutic activity, [id number]- Neuromuscular re-education, 97535- Self Care, [id number]- Manual therapy, Patient/Family education, Joint mobilization, and Scar mobilization  PLAN FOR NEXT SESSION: ***Start on UBE and review HEP, follow up with how MD appointment  went and any precautions that may be available     Burnard Meth, PT 03/01/24  3:47 PM

## 2024-03-01 NOTE — ED Provider Notes (Addendum)
 [facility]    CSN: [id number] Arrival date & time: 03/01/24  1450      History   Chief Complaint Chief Complaint  Patient presents with   Hematuria    HPI William Franco is a 48 y.o. male.  4 days ago started having dysuria Has continued, more of an irritation feeling Today noted blood in urine - pink colored.  Not having abdominal or flank pain, groin pain, nausea/vomiting, fever, chills, penile discharge, rash   History of kidney stone and cystitis, although over 10 years ago  Past Medical History:  Diagnosis Date   Asthma    Hypertension    Kidney stones    Pre-diabetes    Spinal stenosis, lumbar region, 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    Patient Active Problem List   Diagnosis Date Noted   Rupture of distal biceps tendon, right, initial encounter 01/06/2024   Routine adult health maintenance 06/06/2023   Cluster headache, intractable 08/31/2021   Hyperlipidemia 04/02/2021   Pain in the abdomen 08/09/2019   Prediabetes 05/18/2019   Varicose vein of leg 10/19/2018   Seasonal allergies 10/19/2018   Gout 08/06/2016   Erectile dysfunction 04/19/2012   Hypertension 12/18/2010   Recurrent genital herpes 12/18/2010   Tobacco abuse 12/18/2010   Obesity 12/18/2010    Past Surgical History:  Procedure Laterality Date   BACK SURGERY     DISTAL BICEPS TENDON REPAIR Right 01/06/2024   Procedure: REPAIR, TENDON, BICEPS, DISTAL;  Surgeon: Jerri Kay HERO, MD;  Location: [facility];  Service: Orthopedics;  Laterality: Right;   DISTAL BICEPS TENDON REPAIR Left 01/20/2024   Procedure: REPAIR, TENDON, BICEPS, DISTAL;  Surgeon: Jerri Kay HERO, MD;  Location: [facility];  Service: Orthopedics;  Laterality: Left;   ENDOVENOUS ABLATION SAPHENOUS VEIN W/ LASER Left 06/23/2019   endovenous laser ablation left greater saphenous vein and stab phlebectomy 10-20 incisions left leg by Medford Blade MD        Home Medications     Prior to Admission medications   Medication Sig Start Date End Date Taking? Authorizing Provider  sulfamethoxazole -trimethoprim  (BACTRIM  DS) 800-160 MG tablet Take 1 tablet by mouth 2 (two) times daily for 7 days. 03/01/24 03/08/24 Yes Nicolae Vasek, Asberry, PA-C  tamsulosin (FLOMAX) 0.4 MG CAPS capsule Take 1 capsule (0.4 mg total) by mouth daily. 03/01/24  Yes Elayjah Chaney, Asberry, PA-C  amLODipine  (NORVASC ) 10 MG tablet Take 1 tablet (10 mg total) by mouth daily. 08/03/23   Donah Laymon PARAS, MD  fluticasone  (FLONASE ) 50 MCG/ACT nasal spray Place 1 spray into both nostrils daily as needed for allergies. 06/04/23   Donah Laymon PARAS, MD  gabapentin (NEURONTIN) 100 MG capsule Take 1-3 capsules (100-300 mg total) by mouth at bedtime. 03/01/24   Jerri Kay HERO, MD  hydrochlorothiazide  (HYDRODIURIL ) 25 MG tablet Take 1 tablet (25 mg total) by mouth daily. 08/03/23   Donah Laymon PARAS, MD  HYDROcodone -acetaminophen  (NORCO/VICODIN) 5-325 MG tablet Take 1 tablet by mouth 3 (three) times daily as needed. 01/19/24   Jule Ronal CROME, PA-C  losartan  (COZAAR ) 100 MG tablet Take 1 tablet (100 mg total) by mouth daily. 08/03/23   Donah Laymon PARAS, MD  metFORMIN  (GLUCOPHAGE -XR) 500 MG 24 hr tablet Take 1 tablet (500 mg total) by mouth daily with breakfast. 11/09/23   Donah Laymon PARAS, MD  methocarbamol  (ROBAXIN ) 500 MG tablet Take 1 tablet (500 mg total) by mouth 2 (two) times daily as needed. 01/04/24   Stanbery, Mary L, PA-C  Multiple Vitamin (MULTIVITAMIN WITH MINERALS) TABS tablet Take 1 tablet by mouth daily.    [provider]  ondansetron  (ZOFRAN ) 4 MG tablet Take 1 tablet (4 mg total) by mouth every 8 (eight) hours as needed for nausea or vomiting. 01/04/24   Jule Ronal CROME, PA-C  rosuvastatin  (CRESTOR ) 10 MG tablet Take 1 tablet (10 mg total) by mouth daily. 11/09/23   Donah Laymon PARAS, MD  sildenafil  (VIAGRA ) 100 MG tablet Take 1 tablet (100 mg total) by mouth as needed. 08/03/23   Donah Laymon PARAS, MD    Family History Family History  Problem Relation Age of Onset   Hypertension Father    Hypertension Mother    Colon cancer Neg Hx    Colon polyps Neg Hx    Esophageal cancer Neg Hx    Rectal cancer Neg Hx    Stomach cancer Neg Hx     Social History Social History   Tobacco Use   Smoking status: Every Day    Types: Cigars   Smokeless tobacco: Never  Vaping Use   Vaping status: Never Used  Substance Use Topics   Alcohol use: Yes    Comment: occasionally   Drug use: No     Allergies   Bee venom   Review of Systems Review of Systems  As per HPI  Physical Exam Triage Vital Signs ED Triage Vitals  Encounter Vitals Group     BP 03/01/24 1515 (!) 162/91     Girls Systolic BP Percentile --      Girls Diastolic BP Percentile --      Boys Systolic BP Percentile --      Boys Diastolic BP Percentile --      Pulse Rate 03/01/24 1515 82     Resp 03/01/24 1515 18     Temp 03/01/24 1515 98.6 F (37 C)     Temp Source 03/01/24 1515 Oral     SpO2 03/01/24 1515 99 %     Weight 03/01/24 1518 (!) 350 lb (158.8 kg)     Height 03/01/24 1518 6' 8 (2.032 m)     Head Circumference --      Peak Flow --      Pain Score 03/01/24 1517 0     Pain Loc --      Pain Education --      Exclude from Growth Chart --    No data found.  Updated Vital Signs BP (!) 150/92 (BP Location: Left Arm)   Pulse 82   Temp 98.6 F (37 C) (Oral)   Resp 18   Ht 6' 8 (2.032 m)   Wt (!) 350 lb (158.8 kg)   SpO2 99%   BMI 38.45 kg/m    Physical Exam Vitals and nursing note reviewed.  Constitutional:      Appearance: Normal appearance.  HENT:     Mouth/Throat:     Mouth: Mucous membranes are moist.     Pharynx: Oropharynx is clear.  Eyes:     Conjunctiva/sclera: Conjunctivae normal.  Cardiovascular:     Rate and Rhythm: Normal rate and regular rhythm.     Heart sounds: Normal heart sounds.  Pulmonary:     Effort: Pulmonary effort is normal.     Breath sounds:  Normal breath sounds.  Abdominal:     Palpations: Abdomen is soft.     Tenderness: There is no abdominal tenderness. There is no right CVA tenderness, left CVA tenderness, guarding or rebound.  Musculoskeletal:  General: Normal range of motion.  Skin:    General: Skin is warm and dry.     Findings: No bruising, erythema or rash.  Neurological:     Mental Status: He is alert and oriented to person, place, and time.      UC Treatments / Results  Labs (all labs ordered are listed, but only abnormal results are displayed) Labs Reviewed  POCT URINE DIPSTICK - Abnormal; Notable for the following components:      Result Value   Blood, UA moderate (*)    Urobilinogen, UA 4.0 (*)    All other components within normal limits  URINE CULTURE    EKG   Radiology No results found.  Procedures Procedures (including critical care time)  Medications Ordered in UC Medications - No data to display  Initial Impression / Assessment and Plan / UC Course  I have reviewed the triage vital signs and the nursing notes.  Pertinent labs & imaging results that were available during my care of the patient were reviewed by me and considered in my medical decision making (see chart for details).  Afebrile, well appearing   UA with moderate blood Culture is pending.  History of cystitis and stones Treat for both. Bactrim  BID x 7 days Flomax once daily Good kidney function on chart review   Advised return and ED precautions. Agrees to plan, no questions   Final Clinical Impressions(s) / UC Diagnoses   Final diagnoses:  Gross hematuria  Cystitis     Discharge Instructions      We will call you if anything on urine culture requires a change in therapy (about 1-3 days)  In the meantime I am treating you for a urinary tract infection. Please take the antibiotic Bactrim  as prescribed, with food to avoid upset stomach. Drink lots of water!  I am also treating you for a possible  kidney stone.  Take the Flomax once daily.  This medicine will help relax the ureters to help move a stone through.  If you develop severe pain or inability to urinate, please go to the emergency department for evaluation     ED Prescriptions     Medication Sig Dispense Auth. Provider   tamsulosin (FLOMAX) 0.4 MG CAPS capsule Take 1 capsule (0.4 mg total) by mouth daily. 30 capsule Shanya Ferriss, PA-C   sulfamethoxazole -trimethoprim  (BACTRIM  DS) 800-160 MG tablet Take 1 tablet by mouth 2 (two) times daily for 7 days. 14 tablet Rhylee Nunn, Asberry, PA-C      PDMP not reviewed this encounter.   Marquesha Robideau, PA-C 03/01/24 1607    Amedio Bowlby, Asberry, PA-C 03/01/24 1625

## 2024-03-01 NOTE — Discharge Instructions (Signed)
 We will call you if anything on urine culture requires a change in therapy (about 1-3 days)  In the meantime I am treating you for a urinary tract infection. Please take the antibiotic Bactrim  as prescribed, with food to avoid upset stomach. Drink lots of water!  I am also treating you for a possible kidney stone.  Take the Flomax once daily.  This medicine will help relax the ureters to help move a stone through.  If you develop severe pain or inability to urinate, please go to the emergency department for evaluation

## 2024-03-01 NOTE — Progress Notes (Signed)
 Post-Op Visit Note   Patient: William Franco           Date of Birth: 09-09-1975           MRN: [id number] Visit Date: 03/01/2024 PCP: Donah Laymon PARAS, MD   Assessment & Plan:  Chief Complaint:  Chief Complaint  Patient presents with   Left Elbow - Follow-up    REPAIR, TENDON, BICEPS, DISTAL, LEFT 01/20/2024   Visit Diagnoses:  1. Rupture of distal biceps tendon, right, initial encounter     Plan: History of Present Illness William Franco is a 48 year old male who presents for 6 week postop check.  He underwent surgery on 01/20/2024 and is experiencing LABC nerve pain, described as a mixture of pain and numbness, in the forearm and 1st dorsal webspace.   He has attended physical therapy sessions and is on light duty at work with lifting restrictions. He takes gabapentin at night for nerve pain management. His work accommodates his lifting restrictions, and he avoids overexertion. He has reduced biceps strength due to the injury and surgery.  Physical Exam SKIN: Scar healed.  Elbow ROM progressing nicely.    Assessment and Plan Right biceps tendon strain status post surgical repair Six weeks post-operative. Scar healed. Expected weakness in right biceps strength.  - Continue light duty with 5-pound lifting restriction for six weeks. - Provided work note for lifting restrictions. - Prescribed gabapentin 100-300 mg at night for LABC nerve symptoms  Follow-Up Instructions: Return in about 6 weeks (around 04/12/2024).   Orders:  No orders of the defined types were placed in this encounter.  Meds ordered this encounter  Medications   gabapentin (NEURONTIN) 100 MG capsule    Sig: Take 1-3 capsules (100-300 mg total) by mouth at bedtime.    Dispense:  30 capsule    Refill:  3    Imaging: No results found.  PMFS History: Patient Active Problem List   Diagnosis Date Noted   Rupture of distal biceps tendon, right, initial encounter 01/06/2024   Routine  adult health maintenance 06/06/2023   Cluster headache, intractable 08/31/2021   Hyperlipidemia 04/02/2021   Pain in the abdomen 08/09/2019   Prediabetes 05/18/2019   Varicose vein of leg 10/19/2018   Seasonal allergies 10/19/2018   Gout 08/06/2016   Erectile dysfunction 04/19/2012   Hypertension 12/18/2010   Recurrent genital herpes 12/18/2010   Tobacco abuse 12/18/2010   Obesity 12/18/2010   Past Medical History:  Diagnosis Date   Asthma    Hypertension    Kidney stones    Pre-diabetes    Spinal stenosis, lumbar region, 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    Family History  Problem Relation Age of Onset   Hypertension Father    Hypertension Mother    Colon cancer Neg Hx    Colon polyps Neg Hx    Esophageal cancer Neg Hx    Rectal cancer Neg Hx    Stomach cancer Neg Hx     Past Surgical History:  Procedure Laterality Date   BACK SURGERY     DISTAL BICEPS TENDON REPAIR Right 01/06/2024   Procedure: REPAIR, TENDON, BICEPS, DISTAL;  Surgeon: Jerri Kay HERO, MD;  Location: [facility];  Service: Orthopedics;  Laterality: Right;   DISTAL BICEPS TENDON REPAIR Left 01/20/2024   Procedure: REPAIR, TENDON, BICEPS, DISTAL;  Surgeon: Jerri Kay HERO, MD;  Location: [facility];  Service: Orthopedics;  Laterality: Left;   ENDOVENOUS ABLATION SAPHENOUS VEIN W/ LASER  Left 06/23/2019   endovenous laser ablation left greater saphenous vein and stab phlebectomy 10-20 incisions left leg by Medford Blade MD    Social History   Occupational History   Not on file  Tobacco Use   Smoking status: Every Day    Types: Cigars   Smokeless tobacco: Never  Vaping Use   Vaping status: Never Used  Substance and Sexual Activity   Alcohol use: Yes    Comment: occasionally   Drug use: No   Sexual activity: Not on file

## 2024-03-02 ENCOUNTER — Telehealth: Payer: Self-pay

## 2024-03-02 ENCOUNTER — Encounter

## 2024-03-02 NOTE — Telephone Encounter (Signed)
 Called and spoke with patient.  He stated he forgot his appointment.  Offered to reschedule the patient for this week but he declined.  Reminded him of next appt scheduled 03/08/24.

## 2024-03-05 LAB — URINE CULTURE: Culture: 60000 — AB

## 2024-03-07 ENCOUNTER — Other Ambulatory Visit (HOSPITAL_COMMUNITY): Payer: Self-pay

## 2024-03-07 ENCOUNTER — Ambulatory Visit: Payer: Self-pay | Admitting: Emergency Medicine

## 2024-03-07 MED ORDER — CEFUROXIME AXETIL 500 MG PO TABS
500.0000 mg | ORAL_TABLET | Freq: Two times a day (BID) | ORAL | 0 refills | Status: AC
  Filled 2024-03-07: qty 14, 7d supply, fill #0

## 2024-03-07 NOTE — Therapy (Incomplete)
 OUTPATIENT PHYSICAL THERAPY UPPER EXTREMITY TREATMENT   Patient Name: William Franco MRN: [id number] DOB:June 03, 1975, 48 y.o., male Today's Date: 03/07/2024  END OF SESSION:    Past Medical History:  Diagnosis Date   Asthma    Hypertension    Kidney stones    Pre-diabetes    Spinal stenosis, lumbar region, 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   Past Surgical History:  Procedure Laterality Date   BACK SURGERY     DISTAL BICEPS TENDON REPAIR Right 01/06/2024   Procedure: REPAIR, TENDON, BICEPS, DISTAL;  Surgeon: Jerri Kay HERO, MD;  Location: [facility];  Service: Orthopedics;  Laterality: Right;   DISTAL BICEPS TENDON REPAIR Left 01/20/2024   Procedure: REPAIR, TENDON, BICEPS, DISTAL;  Surgeon: Jerri Kay HERO, MD;  [facility];  Service: Orthopedics;  Laterality: Left;   ENDOVENOUS ABLATION SAPHENOUS VEIN W/ LASER Left 06/23/2019   endovenous laser ablation left greater saphenous vein and stab phlebectomy 10-20 incisions left leg by Medford Blade MD    Patient Active Problem List   Diagnosis Date Noted   Rupture of distal biceps tendon, right, initial encounter 01/06/2024   Routine adult health maintenance 06/06/2023   Cluster headache, intractable 08/31/2021   Hyperlipidemia 04/02/2021   Pain in the abdomen 08/09/2019   Prediabetes 05/18/2019   Varicose vein of leg 10/19/2018   Seasonal allergies 10/19/2018   Gout 08/06/2016   Erectile dysfunction 04/19/2012   Hypertension 12/18/2010   Recurrent genital herpes 12/18/2010   Tobacco abuse 12/18/2010   Obesity 12/18/2010    PCP: Donah Laymon PARAS, MD   REFERRING PROVIDER: Donah Laymon PARAS, MD   REFERRING DIAG: [phone] (ICD-10-CM) - Rupture of distal biceps tendon, right, initial encounter   THERAPY DIAG:  No diagnosis found.  Rationale for Evaluation and Treatment: Rehabilitation  ONSET DATE: 01/20/24  SUBJECTIVE:                                                                                                                                                                                       SUBJECTIVE STATEMENT: ***Patient endorsing continued nerve pain that he has previously discussed with MD.    Hand dominance: Right  PERTINENT HISTORY: No previous R shoulder /elbow surgery  Patient reports rupturing the right bicep tendon, going in for repair on 01/08/24.   Post repair having a second rupture and a second repair on 01/20/24.  He reports minimal pain at this point and he has returned to work only using the left upper extremity for any lifting required. He is still restricted to no lifting with the right upper extremity.  PAIN:  ***Are you having pain? Yes: NPRS  scale: not rated this session Pain location: along R forearm and thumb Pain description: radiating Aggravating factors: motion, working Relieving factors: rest  PRECAUTIONS: Other: lifting  RED FLAGS: None   WEIGHT BEARING RESTRICTIONS: Yes on UE for 6 weeks  FALLS:  Has patient fallen in last 6 months? No  LIVING ENVIRONMENT: Lives with: lives with their family Lives in: House/apartment  OCCUPATION: Works at [employer].  Lifts only only with the left.  PLOF: Independent and Independent with basic ADLs  PATIENT GOALS: Increase strength  NEXT MD VISIT: @11 /4/25  OBJECTIVE:  Note: Objective measures were completed at Evaluation unless otherwise noted.  DIAGNOSTIC FINDINGS:  01/13/24 Xray- X-rays of the right elbow show no acute bony abnormalities.  Metallic  button is properly positioned on the dorsal cortex of the proximal radius.  12/30/23  MRI -IMPRESSION: Full-thickness retracted biceps tendon tear as above.  01/13/24 MRI- IMPRESSION: 1. The biceps tendon has ruptured from its surgical attachment site on the radius. The tendon is retracted approximately 17 mm. Associated severe surrounding inflammation/edema/hemorrhage. 2. Mild tendinopathy  involving the common flexor tendon. 3. Intact medial and lateral collateral ligaments.    PATIENT SURVEYS :  PSFS: THE PATIENT SPECIFIC FUNCTIONAL SCALE  Place score of 0-10 (0 = unable to perform activity and 10 = able to perform activity at the same level as before injury or problem)  Activity Date: 02/11/24    Lifting anything 0    2.Lifting > 8 lbs 0    3.Putting on deoderant 6    4.      Total Score 2      Total Score = Sum of activity scores/number of activities  Minimally Detectable Change: 3 points (for single activity); 2 points (for average score)  [facility] (nd). The Patient Specific Functional Scale . Retrieved from [email or web address]   COGNITION: Overall cognitive status: Within functional limits for tasks assessed     SENSATION: WFL  POSTURE: WNL  UPPER EXTREMITY ROM:   A/P ROM Right eval Left eval  Shoulder flexion    Shoulder extension    Shoulder abduction    Shoulder adduction    Shoulder internal rotation    Shoulder external rotation    Elbow flexion 125   Elbow extension +8   Wrist flexion 50%   Wrist extension 50%   Wrist ulnar deviation    Wrist radial deviation    Wrist pronation    Wrist supination 75%   (Blank rows = not tested)  UPPER EXTREMITY MMT:  MMT Right eval Left eval  Shoulder flexion    Shoulder extension    Shoulder abduction    Shoulder adduction    Shoulder internal rotation    Shoulder external rotation    Middle trapezius    Lower trapezius    Elbow flexion 3+   Elbow extension 3+   Wrist flexion 4   Wrist extension 4   Wrist ulnar deviation    Wrist radial deviation    Wrist pronation    Wrist supination    Grip strength (lbs)    (Blank rows = not tested)  SHOULDER SPECIAL TESTS: s/p no tests  JOINT MOBILITY TESTING:  WFL  PALPATION:  1+ tenderness at incision  TREATMENT DATE:  03/02/24***     02/26/2024 Manual:  IASTM with cup for scar massage  Caused very minimal bleeding at distal scar; patient educated on importance of keeping the area clean  TherEx:  UBE with bilat UE and LE for ROM level 1 for 6 minutes  Sidelying AROM shoulder flexion 2x10 Sidelying AROM shoulder abduction 2x10  Sidelying AROM ER 2x10  Seated AROM elbow flexion into extension 2x10    02/11/24  Initial evaluation of R elbow completed f/b instrucxtion in HEP   PATIENT EDUCATION: Education details: HEP Person educated: Patient Education method: Programmer, Multimedia, Demonstration, Actor cues, and Verbal cues Education comprehension: verbalized understanding, returned demonstration, and verbal cues required  HOME EXERCISE PROGRAM: Access Code: [email or web address] URL: [email or web address] Date: 02/11/2024 Prepared by: Burnard Meth  Exercises - Seated Elbow Flexion and Extension AROM  - 2 x daily - 7 x weekly - 2 sets - 10 reps - Seated Forearm Pronation and Supination AROM  - 2 x daily - 7 x weekly - 2 sets - 10 reps - Wrist Flexion Extension AROM - Palms Down  - 2 x daily - 7 x weekly - 2 sets - 10 reps - Seated Wrist Flexion AROM  - 2 x daily - 7 x weekly - 2 sets - 10 reps - Seated Scapular Retraction  - 2 x daily - 7 x weekly - 2 sets - 10 reps - Seated Shoulder Shrugs  - 2 x daily - 7 x weekly - 2 sets - 10 reps  ASSESSMENT:  CLINICAL IMPRESSION: ***Patient arrived to session noting no pain or soreness, though experiencing nerve pain that he has discussed with the MD about. Patient tolerated all activities this date. Patient did experience very minimal bleeding during cupping for scar massage this date and was educated on importance of cleaning the area. Patient will continue to benefit from skilled PT.   OBJECTIVE IMPAIRMENTS: decreased ROM, decreased strength, impaired flexibility, impaired UE  functional use, and pain.   ACTIVITY LIMITATIONS: carrying, lifting, and dressing  PARTICIPATION LIMITATIONS: meal prep, cleaning, and occupation  PERSONAL FACTORS: None are also affecting patient's functional outcome.   REHAB POTENTIAL: Good  CLINICAL DECISION MAKING: Stable/uncomplicated  EVALUATION COMPLEXITY: Moderate  GOALS: Goals reviewed with patient? Yes  SHORT TERM GOALS: Target date: 03/11/2024    Patient to be independent with HEP. Baseline: Goal status: INITIAL  2.  Decreased pain by 1 level with all ADLs. Baseline:  Goal status: INITIAL  LONG TERM GOALS: Target date: 04/22/2024  10 weeks    Increase active range of motion of right elbow and forearm to within normal limits and pain-free. Baseline:  Goal status: INITIAL  2.  Increase strength of right upper extremity to at least 4 out of 5 by time of discharge.  Plan Baseline: 3/5 Goal status: INITIAL  3.  Minimal to no pain less than 2 out of 10 with all ADLs and work activities. Baseline: 3/10 Goal status: INITIAL  4.  Patient able to lift with no pain or restriction weight designated by doctor at time of discharge Baseline: restricted Goal status: INITIAL  5.  Increase PSFS score by 3 points. Baseline: 2 Goal status: INITIAL  PLAN: PT FREQUENCY: 1-2x/week  PT DURATION: 10 weeks  PLANNED INTERVENTIONS: 97164- PT Re-evaluation, 97110-Therapeutic exercises, 97530- Therapeutic activity, [id number]- Neuromuscular re-education, 97535- Self Care, [id number]- Manual therapy, Patient/Family education, Joint mobilization, and Scar mobilization  PLAN FOR NEXT SESSION: ***Start on UBE and review HEP, follow up with how MD appointment  went and any precautions that may be available     Susannah Daring, PT, DPT 03/07/24 12:59 PM

## 2024-03-08 ENCOUNTER — Encounter

## 2024-03-08 ENCOUNTER — Other Ambulatory Visit (HOSPITAL_COMMUNITY): Payer: Self-pay

## 2024-03-08 ENCOUNTER — Telehealth: Payer: Self-pay

## 2024-03-08 MED ORDER — CLINDAMYCIN HCL 300 MG PO CAPS
300.0000 mg | ORAL_CAPSULE | Freq: Three times a day (TID) | ORAL | 0 refills | Status: DC
  Filled 2024-03-08: qty 21, 7d supply, fill #0

## 2024-03-08 MED ORDER — CLINDAMYCIN HCL 300 MG PO CAPS
300.0000 mg | ORAL_CAPSULE | Freq: Three times a day (TID) | ORAL | 0 refills | Status: AC
  Filled 2024-03-08: qty 21, 7d supply, fill #0

## 2024-03-08 MED ORDER — HYDROCODONE-ACETAMINOPHEN 7.5-325 MG PO TABS
1.0000 | ORAL_TABLET | ORAL | 0 refills | Status: AC | PRN
  Filled 2024-03-08: qty 12, 2d supply, fill #0

## 2024-03-08 NOTE — Telephone Encounter (Signed)
 PT called patient as he was a no-show for today's session at 1430. Patient answered phone call and endorsed that he is not going to return to PT as he feels he is recovering well and also has other stuff going on and does not want to financially cover multiple copays. PT reminded patient that he is able to return to clinic with a referral if he felt he needed assistance with recovery. Patient acknowledged.   Susannah Daring, PT, DPT 03/08/24 2:57 PM

## 2024-03-10 ENCOUNTER — Encounter: Admitting: Rehabilitative and Restorative Service Providers"
